# Patient Record
Sex: Male | Born: 1935
Health system: Southern US, Community
[De-identification: ages and names within clinical notes are randomized; demographics above are authoritative.]

## PROBLEM LIST (undated history)

## (undated) DIAGNOSIS — E785 Hyperlipidemia, unspecified: Secondary | ICD-10-CM

## (undated) DIAGNOSIS — I4891 Unspecified atrial fibrillation: Secondary | ICD-10-CM

## (undated) DIAGNOSIS — E079 Disorder of thyroid, unspecified: Secondary | ICD-10-CM

## (undated) DIAGNOSIS — K5790 Diverticulosis of intestine, part unspecified, without perforation or abscess without bleeding: Secondary | ICD-10-CM

## (undated) DIAGNOSIS — Z860100 Personal history of colon polyps, unspecified: Secondary | ICD-10-CM

## (undated) DIAGNOSIS — R768 Other specified abnormal immunological findings in serum: Secondary | ICD-10-CM

## (undated) DIAGNOSIS — C499 Malignant neoplasm of connective and soft tissue, unspecified: Secondary | ICD-10-CM

## (undated) DIAGNOSIS — E039 Hypothyroidism, unspecified: Secondary | ICD-10-CM

## (undated) DIAGNOSIS — N529 Male erectile dysfunction, unspecified: Secondary | ICD-10-CM

## (undated) DIAGNOSIS — C4492 Squamous cell carcinoma of skin, unspecified: Secondary | ICD-10-CM

## (undated) DIAGNOSIS — I10 Essential (primary) hypertension: Secondary | ICD-10-CM

## (undated) HISTORY — DX: Other specified abnormal immunological findings in serum: R76.8

## (undated) HISTORY — DX: Essential (primary) hypertension: I10

## (undated) HISTORY — DX: Diverticulosis of intestine, part unspecified, without perforation or abscess without bleeding: K57.90

## (undated) HISTORY — DX: Personal history of colon polyps, unspecified: Z86.0100

## (undated) HISTORY — DX: Personal history of colonic polyps: Z86.010

## (undated) HISTORY — DX: Male erectile dysfunction, unspecified: N52.9

## (undated) HISTORY — DX: Disorder of thyroid, unspecified: E07.9

## (undated) HISTORY — DX: Hyperlipidemia, unspecified: E78.5

---
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
## 2005-12-27 HISTORY — PX: COLONOSCOPY: SHX174

## 2006-06-23 ENCOUNTER — Ambulatory Visit: Payer: Self-pay | Admitting: Family Medicine

## 2006-06-30 ENCOUNTER — Ambulatory Visit: Payer: Self-pay | Admitting: Family Medicine

## 2006-07-26 ENCOUNTER — Ambulatory Visit: Payer: Self-pay | Admitting: Gastroenterology

## 2006-08-11 ENCOUNTER — Ambulatory Visit: Payer: Self-pay | Admitting: Gastroenterology

## 2006-08-11 ENCOUNTER — Encounter (INDEPENDENT_AMBULATORY_CARE_PROVIDER_SITE_OTHER): Payer: Self-pay | Admitting: *Deleted

## 2006-08-11 LAB — HM COLONOSCOPY

## 2007-08-01 ENCOUNTER — Ambulatory Visit: Payer: Self-pay | Admitting: Family Medicine

## 2007-11-06 ENCOUNTER — Ambulatory Visit: Payer: Self-pay | Admitting: Family Medicine

## 2008-03-27 ENCOUNTER — Ambulatory Visit: Payer: Self-pay | Admitting: Family Medicine

## 2008-08-22 ENCOUNTER — Ambulatory Visit: Payer: Self-pay | Admitting: Family Medicine

## 2008-09-23 ENCOUNTER — Ambulatory Visit: Payer: Self-pay | Admitting: Family Medicine

## 2009-06-24 ENCOUNTER — Ambulatory Visit: Payer: Self-pay | Admitting: Family Medicine

## 2010-09-08 ENCOUNTER — Ambulatory Visit: Payer: Self-pay | Admitting: Family Medicine

## 2011-09-14 ENCOUNTER — Ambulatory Visit (INDEPENDENT_AMBULATORY_CARE_PROVIDER_SITE_OTHER): Payer: Medicare Other | Admitting: Family Medicine

## 2011-09-14 VITALS — BP 116/70 | HR 70 | Wt 175.0 lb

## 2011-09-14 DIAGNOSIS — L989 Disorder of the skin and subcutaneous tissue, unspecified: Secondary | ICD-10-CM

## 2011-09-14 NOTE — Progress Notes (Signed)
  Subjective:    Patient ID: Jeffrey Hardin, male    DOB: 09-Feb-1934, 77 y.o.   MRN: [id number]  HPI He is here for evaluation of possible shingles. He developed a lesion on the right neck approximately 2 weeks ago. He has had no pain in that area tingling or burning.   Review of Systems     Objective:   Physical Exam Alert and in no distress. A. healing the 1 x 2 cm lesion is noted in the right mid neck area.       Assessment & Plan:  Benign neck lesion. Conservative care and return here in several weeks if the lesion continues .

## 2011-09-16 ENCOUNTER — Telehealth: Payer: Self-pay

## 2011-09-16 ENCOUNTER — Telehealth: Payer: Self-pay | Admitting: Family Medicine

## 2011-09-16 MED ORDER — AMLODIPINE BESY-BENAZEPRIL HCL 10-20 MG PO CAPS: 1.0000 | ORAL_CAPSULE | Freq: Every day | ORAL | Status: DC

## 2011-09-16 MED ORDER — LEVOTHYROXINE SODIUM 100 MCG PO CAPS: 1.0000 | ORAL_CAPSULE | ORAL | Status: DC

## 2011-09-16 NOTE — Telephone Encounter (Signed)
Called pt to inform him meds were sent in and pt is in need of a med check

## 2011-09-16 NOTE — Telephone Encounter (Signed)
Have him set up a med check appointment 

## 2011-09-16 NOTE — Telephone Encounter (Signed)
Meds renewed. He will need a followup med check appointment

## 2011-09-22 ENCOUNTER — Other Ambulatory Visit: Payer: Self-pay | Admitting: Family Medicine

## 2011-09-22 MED ORDER — LEVOTHYROXINE SODIUM 100 MCG PO TABS: 100.0000 ug | ORAL_TABLET | Freq: Every day | ORAL | Status: DC

## 2011-09-24 ENCOUNTER — Other Ambulatory Visit: Payer: Self-pay

## 2011-09-24 MED ORDER — LEVOTHYROXINE SODIUM 100 MCG PO TABS: 100.0000 ug | ORAL_TABLET | Freq: Every day | ORAL | Status: DC

## 2011-09-24 NOTE — Telephone Encounter (Signed)
RESENT L[facility]OT[facility]RO[facility]INE TO [facility]

## 2011-09-27 ENCOUNTER — Encounter: Payer: Self-pay | Admitting: Family Medicine

## 2011-09-28 ENCOUNTER — Ambulatory Visit (INDEPENDENT_AMBULATORY_CARE_PROVIDER_SITE_OTHER): Payer: Medicare Other | Admitting: Family Medicine

## 2011-09-28 ENCOUNTER — Encounter: Payer: Self-pay | Admitting: Family Medicine

## 2011-09-28 VITALS — BP 112/70 | HR 62 | Wt 176.0 lb

## 2011-09-28 DIAGNOSIS — E785 Hyperlipidemia, unspecified: Secondary | ICD-10-CM | POA: Insufficient documentation

## 2011-09-28 DIAGNOSIS — E039 Hypothyroidism, unspecified: Secondary | ICD-10-CM

## 2011-09-28 DIAGNOSIS — I119 Hypertensive heart disease without heart failure: Secondary | ICD-10-CM | POA: Insufficient documentation

## 2011-09-28 DIAGNOSIS — I10 Essential (primary) hypertension: Secondary | ICD-10-CM

## 2011-09-28 DIAGNOSIS — Z23 Encounter for immunization: Secondary | ICD-10-CM

## 2011-09-28 DIAGNOSIS — Z79899 Other long term (current) drug therapy: Secondary | ICD-10-CM

## 2011-09-28 LAB — COMPREHENSIVE METABOLIC PANEL
ALT: 46 U/L (ref 0–53)
AST: 38 U/L — ABNORMAL HIGH (ref 0–37)
Albumin: 4.1 g/dL (ref 3.5–5.2)
Alkaline Phosphatase: 45 U/L (ref 39–117)
BUN: 12 mg/dL (ref 6–23)
CO2: 26 mEq/L (ref 19–32)
Calcium: 9 mg/dL (ref 8.4–10.5)
Chloride: 102 mEq/L (ref 96–112)
Creat: 0.65 mg/dL (ref 0.50–1.35)
Glucose, Bld: 100 mg/dL — ABNORMAL HIGH (ref 70–99)
Potassium: 4.4 mEq/L (ref 3.5–5.3)
Sodium: 138 mEq/L (ref 135–145)
Total Bilirubin: 0.5 mg/dL (ref 0.3–1.2)
Total Protein: 6.7 g/dL (ref 6.0–8.3)

## 2011-09-28 LAB — CBC WITH DIFFERENTIAL/PLATELET
Basophils Absolute: 0 10*3/uL (ref 0.0–0.1)
Basophils Relative: 0 % (ref 0–1)
Eosinophils Absolute: 0.1 10*3/uL (ref 0.0–0.7)
Eosinophils Relative: 2 % (ref 0–5)
HCT: 47.1 % (ref 39.0–52.0)
Hemoglobin: 15.9 g/dL (ref 13.0–17.0)
Lymphocytes Relative: 22 % (ref 12–46)
Lymphs Abs: 1.1 10*3/uL (ref 0.7–4.0)
MCH: 32.8 pg (ref 26.0–34.0)
MCHC: 33.8 g/dL (ref 30.0–36.0)
MCV: 97.1 fL (ref 78.0–100.0)
Monocytes Absolute: 0.5 10*3/uL (ref 0.1–1.0)
Monocytes Relative: 11 % (ref 3–12)
Neutro Abs: 3.2 10*3/uL (ref 1.7–7.7)
Neutrophils Relative %: 66 % (ref 43–77)
Platelets: 303 10*3/uL (ref 150–400)
RBC: 4.85 MIL/uL (ref 4.22–5.81)
RDW: 13.6 % (ref 11.5–15.5)
WBC: 5 10*3/uL (ref 4.0–10.5)

## 2011-09-28 LAB — LIPID PANEL
HDL: 80 mg/dL (ref >39)
LDL Cholesterol: 108 mg/dL — ABNORMAL HIGH (ref 0–99)

## 2011-09-28 LAB — TSH: TSH: 2.464 u[IU]/mL (ref 0.350–4.500)

## 2011-09-28 NOTE — Progress Notes (Signed)
  Subjective:    Patient ID: Jeffrey Hardin, male    DOB: May 05, 1934, 77 y.o.   MRN: [id number]  HPI He is here for medication check. He continues on his thyroid medication and is having no questions or concerns about this. He also takes his Lotrel for blood pressure. Medical record you does show elevated lipid panel. He continues on a multivitamin and takes vitamin E. He has no other concerns or complaints.   Review of Systems     Objective:   Physical Exam Alert and in no distress otherwise not examined      Assessment & Plan:   1. Hypothyroid  TSH  2. Hypertension  CBC with Differential, Comprehensive metabolic panel, Lipid panel  3. Hyperlipidemia LDL goal < 100  Lipid panel  4. Encounter for long-term (current) use of other medications  CBC with Differential, Comprehensive metabolic panel, Lipid panel, TSH   encouraged him to continue take good care of himself.

## 2011-09-28 NOTE — Patient Instructions (Signed)
Continue on your present medications. 

## 2012-03-20 ENCOUNTER — Telehealth: Payer: Self-pay | Admitting: Internal Medicine

## 2012-03-20 MED ORDER — AMLODIPINE BESY-BENAZEPRIL HCL 10-20 MG PO CAPS: 1.0000 | ORAL_CAPSULE | Freq: Every day | ORAL | Status: DC

## 2012-03-20 NOTE — Telephone Encounter (Signed)
Lotrel renewed 

## 2012-03-29 ENCOUNTER — Other Ambulatory Visit: Payer: Self-pay | Admitting: Family Medicine

## 2012-03-29 NOTE — Telephone Encounter (Signed)
Is this ok?

## 2012-03-29 NOTE — Telephone Encounter (Signed)
Diprolene renewed

## 2012-05-29 ENCOUNTER — Other Ambulatory Visit: Payer: Self-pay

## 2012-05-29 ENCOUNTER — Encounter: Payer: Self-pay | Admitting: Family Medicine

## 2012-05-29 ENCOUNTER — Other Ambulatory Visit: Payer: Self-pay | Admitting: Family Medicine

## 2012-05-29 ENCOUNTER — Ambulatory Visit (INDEPENDENT_AMBULATORY_CARE_PROVIDER_SITE_OTHER): Payer: Medicare Other | Admitting: Family Medicine

## 2012-05-29 VITALS — BP 140/70 | HR 59 | Wt 174.0 lb

## 2012-05-29 DIAGNOSIS — J069 Acute upper respiratory infection, unspecified: Secondary | ICD-10-CM

## 2012-05-29 DIAGNOSIS — E039 Hypothyroidism, unspecified: Secondary | ICD-10-CM

## 2012-05-29 DIAGNOSIS — L989 Disorder of the skin and subcutaneous tissue, unspecified: Secondary | ICD-10-CM | POA: Diagnosis not present

## 2012-05-29 DIAGNOSIS — I10 Essential (primary) hypertension: Secondary | ICD-10-CM | POA: Diagnosis not present

## 2012-05-29 DIAGNOSIS — C4441 Basal cell carcinoma of skin of scalp and neck: Secondary | ICD-10-CM | POA: Diagnosis not present

## 2012-05-29 MED ORDER — AMLODIPINE BESY-BENAZEPRIL HCL 10-20 MG PO CAPS: 1.0000 | ORAL_CAPSULE | Freq: Every day | ORAL | Status: DC

## 2012-05-29 NOTE — Telephone Encounter (Signed)
[facility]

## 2012-05-29 NOTE — Progress Notes (Signed)
  Subjective:    Patient ID: Jeffrey Hardin, male    DOB: 03/14/1934, 78 y.o.   MRN: [id number]  HPI He is here for an interval evaluation. He does have a lesion present on the right neck area that over the last 6 months has caused some slight itching and apparently has gotten a little more red and has grown slightly. He also over the last 2 weeks his had difficulty with cough and congestion no fever chills sore throat or earache. This is getting better. He continues on his thyroid medicine and is checking on whether he needs a refill on that.   Review of Systems     Objective:   Physical Exam alert and in no distress. Tympanic membranes and canals are normal. Throat is clear. Tonsils are normal. Neck is supple without adenopathy or thyromegaly. Cardiac exam shows a regular sinus rhythm without murmurs or gallops. Lungs are clear to auscultation. The right side of the neck has an erythematous irregular slightly raised area approximately 2 cm in diameter TSH was checked in September and was normal.     Assessment & Plan:   1. Hypothyroid    2. Hypertension  amLODipine-benazepril (LOTREL) 10-20 MG per capsule  3. Changing skin lesion    4. Acute URI     he has enough refills on thyroid to get until September. Continue on his blood pressure medication and Lotrel was called in. The skin lesion was injected with  1cc Xylocaine and epinephrine. A 2 mm punch biopsy was done without difficulty. Supportive care for the URI since he is getting better on his own.

## 2012-06-13 DIAGNOSIS — C4441 Basal cell carcinoma of skin of scalp and neck: Secondary | ICD-10-CM | POA: Diagnosis not present

## 2012-08-01 DIAGNOSIS — Z85828 Personal history of other malignant neoplasm of skin: Secondary | ICD-10-CM | POA: Diagnosis not present

## 2012-08-30 ENCOUNTER — Encounter: Payer: Self-pay | Admitting: Internal Medicine

## 2012-09-07 ENCOUNTER — Encounter: Payer: Self-pay | Admitting: Family Medicine

## 2012-09-07 ENCOUNTER — Ambulatory Visit (INDEPENDENT_AMBULATORY_CARE_PROVIDER_SITE_OTHER): Payer: Medicare Other | Admitting: Family Medicine

## 2012-09-07 VITALS — BP 130/80 | HR 72 | Wt 175.0 lb

## 2012-09-07 DIAGNOSIS — E039 Hypothyroidism, unspecified: Secondary | ICD-10-CM

## 2012-09-07 DIAGNOSIS — Z79899 Other long term (current) drug therapy: Secondary | ICD-10-CM | POA: Diagnosis not present

## 2012-09-07 LAB — TSH: TSH: 1.641 u[IU]/mL (ref 0.350–4.500)

## 2012-09-07 NOTE — Progress Notes (Signed)
  Subjective:    Patient ID: Jeffrey Hardin, male    DOB: Aug 18, 1934, 78 y.o.   MRN: [id number]  HPI He is here for followup visit. Review of record indicates he has not had thyroid function testing done in approximately one year. He continues on his present thyroid medication and has no particular concerns or complaints. Had no skin changes, hair changes, mental or GI problems.  Review of Systems     Objective:   Physical Exam Alert and in no distress otherwise not examined       Assessment & Plan:   1. Hypothyroid  TSH  2. Encounter for long-term (current) use of other medications  TSH

## 2012-10-18 ENCOUNTER — Other Ambulatory Visit: Payer: Medicare Other

## 2012-10-18 DIAGNOSIS — Z23 Encounter for immunization: Secondary | ICD-10-CM

## 2012-10-18 MED ORDER — INFLUENZA VIRUS VACC SPLIT PF IM SUSP: 0.5000 mL | Freq: Once | INTRAMUSCULAR | Status: AC

## 2012-12-21 ENCOUNTER — Telehealth: Payer: Self-pay | Admitting: Family Medicine

## 2012-12-21 MED ORDER — LEVOTHYROXINE SODIUM 100 MCG PO TABS: 100.0000 ug | ORAL_TABLET | Freq: Every day | ORAL | Status: DC

## 2012-12-21 NOTE — Telephone Encounter (Signed)
Needs refill on levothyroxine 0.1  [facility]

## 2012-12-21 NOTE — Telephone Encounter (Signed)
SENT PT MED IN 

## 2013-03-19 ENCOUNTER — Other Ambulatory Visit: Payer: Self-pay | Admitting: Family Medicine

## 2013-05-02 DIAGNOSIS — H524 Presbyopia: Secondary | ICD-10-CM | POA: Diagnosis not present

## 2013-05-02 DIAGNOSIS — H35039 Hypertensive retinopathy, unspecified eye: Secondary | ICD-10-CM | POA: Diagnosis not present

## 2013-05-02 DIAGNOSIS — H2510 Age-related nuclear cataract, unspecified eye: Secondary | ICD-10-CM | POA: Diagnosis not present

## 2013-08-25 ENCOUNTER — Other Ambulatory Visit: Payer: Self-pay | Admitting: Family Medicine

## 2013-09-10 ENCOUNTER — Telehealth: Payer: Self-pay | Admitting: Family Medicine

## 2013-09-10 NOTE — Telephone Encounter (Signed)
appt made

## 2013-09-11 ENCOUNTER — Ambulatory Visit (INDEPENDENT_AMBULATORY_CARE_PROVIDER_SITE_OTHER): Payer: Medicare Other | Admitting: Family Medicine

## 2013-09-11 ENCOUNTER — Encounter: Payer: Self-pay | Admitting: Family Medicine

## 2013-09-11 VITALS — BP 138/80 | HR 60 | Wt 169.0 lb

## 2013-09-11 DIAGNOSIS — E785 Hyperlipidemia, unspecified: Secondary | ICD-10-CM

## 2013-09-11 DIAGNOSIS — I10 Essential (primary) hypertension: Secondary | ICD-10-CM | POA: Diagnosis not present

## 2013-09-11 DIAGNOSIS — Z79899 Other long term (current) drug therapy: Secondary | ICD-10-CM

## 2013-09-11 DIAGNOSIS — E039 Hypothyroidism, unspecified: Secondary | ICD-10-CM

## 2013-09-11 LAB — COMPREHENSIVE METABOLIC PANEL
Albumin: 4.2 g/dL (ref 3.5–5.2)
Alkaline Phosphatase: 50 U/L (ref 39–117)
BUN: 11 mg/dL (ref 6–23)
CO2: 25 mEq/L (ref 19–32)
Glucose, Bld: 106 mg/dL — ABNORMAL HIGH (ref 70–99)
Potassium: 4.3 mEq/L (ref 3.5–5.3)
Total Bilirubin: 0.6 mg/dL (ref 0.3–1.2)

## 2013-09-11 LAB — LIPID PANEL
Cholesterol: 226 mg/dL — ABNORMAL HIGH (ref 0–200)
HDL: 75 mg/dL (ref >39)
LDL Cholesterol: 138 mg/dL — ABNORMAL HIGH (ref 0–99)
Triglycerides: 66 mg/dL (ref <150)

## 2013-09-11 LAB — CBC WITH DIFFERENTIAL/PLATELET
Basophils Relative: 1 % (ref 0–1)
Eosinophils Absolute: 0.1 10*3/uL (ref 0.0–0.7)
HCT: 47.4 % (ref 39.0–52.0)
Hemoglobin: 16.4 g/dL (ref 13.0–17.0)
MCH: 33.3 pg (ref 26.0–34.0)
MCHC: 34.6 g/dL (ref 30.0–36.0)
MCV: 96.3 fL (ref 78.0–100.0)
Monocytes Absolute: 0.6 10*3/uL (ref 0.1–1.0)
Monocytes Relative: 12 % (ref 3–12)

## 2013-09-11 MED ORDER — LEVOTHYROXINE SODIUM 100 MCG PO TABS: 100.0000 ug | ORAL_TABLET | Freq: Every day | ORAL | Status: DC

## 2013-09-11 MED ORDER — AMLODIPINE BESY-BENAZEPRIL HCL 10-20 MG PO CAPS: ORAL_CAPSULE | ORAL | Status: DC

## 2013-09-11 NOTE — Patient Instructions (Signed)
Keep taking good care of yourself 

## 2013-09-11 NOTE — Progress Notes (Signed)
  Subjective:    Patient ID: CARLIE CORPUS, male    DOB: 01/05/1934, 79 y.o.   MRN: [id number]  HPI He is here for a medication check. He continues to have excellent health. He has no major concerns. Continues on his thyroid medicine as well as his blood pressure medication. He does have an underlying history of hyperlipidemia. He is retired and he and his wife are enjoying their retirement. His medical record was reviewed including visitations and health maintenance.   Review of Systems     Objective:   Physical Exam alert and in no distress. Tympanic membranes and canals are normal. Throat is clear. Tonsils are normal. Neck is supple without adenopathy or thyromegaly. Cardiac exam shows a regular sinus rhythm without murmurs or gallops. Lungs are clear to auscultation.        Assessment & Plan:  Hypothyroid - Plan: TSH, levothyroxine (SYNTHROID) 100 MCG tablet  Hypertension - Plan: CBC with Differential, Comprehensive metabolic panel, amLODipine-benazepril (LOTREL) 10-20 MG per capsule  Hyperlipidemia LDL goal < 100 - Plan: Lipid panel  Encounter for long-term (current) use of other medications - Plan: Flu Vaccine QUAD 36+ mos IM, CBC with Differential, Comprehensive metabolic panel, Lipid panel, TSH  regular flu shot given. The more potent dosing is on back order.

## 2013-12-13 ENCOUNTER — Ambulatory Visit (INDEPENDENT_AMBULATORY_CARE_PROVIDER_SITE_OTHER): Payer: Medicare Other | Admitting: Family Medicine

## 2013-12-13 ENCOUNTER — Encounter: Payer: Self-pay | Admitting: Family Medicine

## 2013-12-13 VITALS — BP 120/70 | HR 60 | Temp 98.0°F | Ht 68.0 in | Wt 162.0 lb

## 2013-12-13 DIAGNOSIS — I4891 Unspecified atrial fibrillation: Secondary | ICD-10-CM | POA: Diagnosis not present

## 2013-12-13 DIAGNOSIS — I10 Essential (primary) hypertension: Secondary | ICD-10-CM | POA: Diagnosis not present

## 2013-12-13 DIAGNOSIS — R079 Chest pain, unspecified: Secondary | ICD-10-CM

## 2013-12-13 DIAGNOSIS — J209 Acute bronchitis, unspecified: Secondary | ICD-10-CM

## 2013-12-13 MED ORDER — AMOXICILLIN 875 MG PO TABS: 875.0000 mg | ORAL_TABLET | Freq: Two times a day (BID) | ORAL | Status: DC

## 2013-12-13 NOTE — Progress Notes (Signed)
   Subjective:    Patient ID: Jeffrey Hardin, male    DOB: Jun 30, 1934, 79 y.o.   MRN: [id number]  HPI He has a two week history of cough but no fever, chills, sore throat or earache. Over the last weekend the coughing got worse and productive as well as feeling of malaise and fatigue. He also describes episodes of swallowing cold liquids and having a chest tightness sensation in the mid chest that does go away relatively quickly. He then had one episode of chest tightness while riding on his lawnmower lasted approximately half an hour associated with weakness. This was several days ago. He has had no PND, DOE or orthopnea. He has had no past difficulty with heart problems. He does not smoke.  Review of Systems     Objective:   Physical Exam alert and in no distress. Tympanic membranes and canals are normal. Throat is clear. Tonsils are normal. Neck is supple without adenopathy or thyromegaly. Cardiac exam shows a regular sinus rhythm without murmurs or gallops. Lungs are clear to auscultation. EKG shows a bradycardia and evidence of atrial fibrillation as well as possible old MI.       Assessment & Plan:  Acute bronchitis - Plan: amoxicillin (AMOXIL) 875 MG tablet  Chest pain - Plan: EKG 12-Lead, Ambulatory referral to Cardiology  Hypertension  Atrial fibrillation, new onset  some of his chest tightness systems can also be GI related however the one episode while he was on his lawnmower has me concerned.

## 2013-12-14 ENCOUNTER — Encounter: Payer: Self-pay | Admitting: Cardiology

## 2013-12-14 ENCOUNTER — Ambulatory Visit
Admission: RE | Admit: 2013-12-14 | Discharge: 2013-12-14 | Disposition: A | Discharge status: Home / Self Care | Payer: Medicare Other | Source: Ambulatory Visit | Attending: Cardiology | Admitting: Cardiology

## 2013-12-14 ENCOUNTER — Other Ambulatory Visit: Payer: Self-pay | Admitting: Cardiology

## 2013-12-14 DIAGNOSIS — I10 Essential (primary) hypertension: Secondary | ICD-10-CM | POA: Diagnosis not present

## 2013-12-14 DIAGNOSIS — I4891 Unspecified atrial fibrillation: Secondary | ICD-10-CM

## 2013-12-14 DIAGNOSIS — R0789 Other chest pain: Secondary | ICD-10-CM | POA: Diagnosis not present

## 2013-12-14 DIAGNOSIS — Z8679 Personal history of other diseases of the circulatory system: Secondary | ICD-10-CM | POA: Insufficient documentation

## 2013-12-14 DIAGNOSIS — Z7901 Long term (current) use of anticoagulants: Secondary | ICD-10-CM | POA: Insufficient documentation

## 2013-12-14 DIAGNOSIS — E039 Hypothyroidism, unspecified: Secondary | ICD-10-CM | POA: Diagnosis not present

## 2013-12-14 NOTE — Progress Notes (Signed)
Patient ID: Jeffrey Hardin, male   DOB: 09/06/1934, 79 y.o.   MRN: [id number]  Jeffrey, Hardin    Date of visit:  12/14/2013 DOB:  06-28-1934    Age:  79 yrs. Medical record number:  [id number]     Account number:  [id number] Primary Care Provider: Jill Alexanders C ____________________________ CURRENT DIAGNOSES  1. Arrhythmia-Atrial Fibrillation  2. Chest Pain  3. Hypertension,Essential (Benign)  4. Hypothyroidism  5. Long Term Use Anticoagulant ____________________________ ALLERGIES  Sildenafil, Intolerance-unknown ____________________________ MEDICATIONS  1. amlodipine 10 mg-benazepril 20 mg capsule, 1 p.o. daily  2. Diprolene 0.05 % lotion, Take as directed  3. Levoxyl 100 mcg tablet, 1 p.o. daily  4. multivitamin tablet, 1 p.o. daily  5. vitamin E 1,000 unit capsule, 1 p.o. daily  6. aspirin 325 mg tablet, PRN  7. Eliquis 5 mg tablet, BID ____________________________ CHIEF COMPLAINTS  Chest tightness with yard work monday  Saw dr Redmond School thurs ____________________________ HISTORY OF PRESENT ILLNESS Patient seen at the request of Dr. Redmond School for evaluation of atrial fibrillation and chest discomfort. The patient has previously been in good health except for history of hypertension and he has a previous left leg amputation as in the previous sarcoma over 50 years ago. He normally gets along and feels well. Earlier this week he was mowing his grass and had some midsternal chest discomfort and did not feel well. He continued to cut the grass and felt some fatigue and later on in the week went to see Dr. Redmond School where he was noted to be in atrial fibrillation and there is a question of septal Q waves on EKG. He currently feels fine at the present time. He was unaware that his heart was irregular and he denied angina or shortness of breath. He has had no recurrence of chest pain since then. He did have a prior history of a cough for a couple of weeks prior to coming in. He otherwise has felt well.  He has had no PND, orthopnea or claudication. He does have some mild edema at the end of the day. ____________________________ PAST HISTORY  Past Medical Illnesses:  hypertension, hypothyroidism, history of synovial sarcoma 1957;  Cardiovascular Illnesses:  atrial fibrillation;  Surgical Procedures:  amputation l leg, tonsillectomy;  Cardiology Procedures-Invasive:  no history of prior cardiac procedures;  Cardiology Procedures-Noninvasive:  no previous non-invasive procedures;  LVEF not documented,   ____________________________ CARDIO-PULMONARY TEST DATES EKG Date:  12/14/2013;   ____________________________ FAMILY HISTORY Brother -- Transplantation of liver, Brother alive with problem Father -- Father dead, Pulmonary emphysema Mother -- Mother dead, Death of unknown cause Sister -- Sister alive and well Sister -- Sister alive and well Sister -- Sister dead, Leukemia Sister -- Sister alive and well ____________________________ SOCIAL HISTORY Alcohol Use:  beer 1 6-pack per day;  Smoking:  never smoked;  Diet:  regular diet;  Lifestyle:  married;  Exercise:  yard work;  Occupation:  Chief Financial Officer;  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  Integumentary:no rashes or new skin lesions. Eyes: wears eye glasses/contact lenses Ears, Nose, Throat, Mouth:  denies any hearing loss, epistaxis, hoarseness or difficulty speaking. Respiratory: denies dyspnea, cough, wheezing or hemoptysis. Cardiovascular:  please review HPI Abdominal: denies dyspepsia, GI bleeding, constipation, or diarrhea Genitourinary-Male: erectile dysfunction  Musculoskeletal:  denies arthritis, venous insufficiency, or muscle weakness. Neurological:  denies headaches, stroke, or TIA Psychiatric:  denies depession or anxiety.  Hematological/Immunologic:  denies any food allergies, bleeding  disorders. ____________________________ PHYSICAL EXAMINATION VITAL SIGNS  Blood Pressure:  140/70 Sitting, Right arm, regular cuff  , 140/72 Standing, Right arm and regular cuff   Pulse:  78/min. Weight:  168.00 lbs. Height:  68"BMI: 25  Constitutional:  pleasant white male in no acute distress Skin:  warm and dry to touch, no apparent skin lesions, or masses noted. Head:  normocephalic, balding male hair pattern Eyes:  EOMS Intact, PERRLA, C and S clear, Funduscopic exam not done. ENT:  ears, nose and throat reveal no gross abnormalities.  Dentition good. Neck:  supple, without massess. No JVD, thyromegaly or carotid bruits. Carotid upstroke normal. Chest:  normal symmetry, clear to auscultation and percussion. Cardiac:  irregular rhythm, normal S1 and S2, no S3 or S4, no murmur Abdomen:  abdomen soft,non-tender, no masses, no hepatospenomegaly, or aneurysm noted Peripheral Pulses:  the femoral,dorsalis pedis, and posterior tibial pulses are full and equal bilaterally with no bruits auscultated. Extremities & Back:  left AKA, 2+ edema, RLE venous insufficiency changes present Neurological:  no gross motor or sensory deficits noted, affect appropriate, oriented x3. ____________________________ MOST RECENT LIPID PANEL 09/11/13  CHOL TOTL 226 mg/dl, LDL 138 NM, HDL 75 mg/dl, TRIGLYCER 66 mg/dl and CHOL/HDL 3.0 (Calc) ____________________________ IMPRESSIONS/PLAN  1. Atrial fibrillation of undetermined age of onset with relatively well-controlled rate 2. Isolated episode of chest discomfort of uncertain cause 3. Hypertension controlled 4. Hyperlipidemia  Recommendations:  Long discussion about atrial fibrillation. His CHADS2VASC score is 3. We discussed atrial fibrillation and prevention of stroke risk with anticoagulation. We discussed various anticoagulations including warfarin as well as the new normal oral anticoagulant. He is getting ready to take a trip in a couple of weeks. His  EKG shows atrial fibrillation and I did not see definite septal Q waves. I would recommend that he have a lab work is noted below as well as an x-ray and that he have an echocardiogram to evaluate his left ventricular function. At the present time he is not that symptomatic. I initiated anticoagulation with Eliquus 5 mg twice daily and will see him in followup when he has his echocardiogram. ____________________________ TODAYS ORDERS  1. 2D, color flow, doppler: First Available  2. CHEST XRAY: Today  3. Comprehensive Metabolic Panel: Today  4. Complete Blood Count: Today  5. Troponin-I  6. Chest X-ray PA/Lat: today  7. 12 Lead EKG: Today                       ____________________________ Cardiology Physician:  Kerry Hough MD [facility]

## 2013-12-19 ENCOUNTER — Encounter: Payer: Self-pay | Admitting: Cardiology

## 2013-12-19 DIAGNOSIS — Z7901 Long term (current) use of anticoagulants: Secondary | ICD-10-CM | POA: Diagnosis not present

## 2013-12-19 DIAGNOSIS — R0789 Other chest pain: Secondary | ICD-10-CM | POA: Diagnosis not present

## 2013-12-19 DIAGNOSIS — I4891 Unspecified atrial fibrillation: Secondary | ICD-10-CM | POA: Diagnosis not present

## 2013-12-19 DIAGNOSIS — I10 Essential (primary) hypertension: Secondary | ICD-10-CM | POA: Diagnosis not present

## 2013-12-19 DIAGNOSIS — E039 Hypothyroidism, unspecified: Secondary | ICD-10-CM | POA: Diagnosis not present

## 2013-12-19 NOTE — Progress Notes (Signed)
Patient ID: Jeffrey Hardin, male   DOB: March 08, 1934, 79 y.o.   MRN: [id number]   [place], [place]    Date of visit:  12/19/2013 DOB:  1934/07/22    Age:  79 yrs. Medical record number:  [id number]     Account number:  [id number] Primary Care Provider: Jill Alexanders C ____________________________ CURRENT DIAGNOSES  1. Arrhythmia-Atrial Fibrillation  2. Hypertensive Heart Disease-Benign without CHF  3. Hypothyroidism  4. Long Term Use Anticoagulant ____________________________ ALLERGIES  Sildenafil, Intolerance-unknown ____________________________ MEDICATIONS  1. amlodipine 10 mg-benazepril 20 mg capsule, 1 p.o. daily  2. Diprolene 0.05 % lotion, Take as directed  3. Levoxyl 100 mcg tablet, 1 p.o. daily  4. multivitamin tablet, 1 p.o. daily  5. vitamin E 1,000 unit capsule, 1 p.o. daily  6. Eliquis 5 mg tablet, BID  7. Osteo Bi-Flex 250 mg-200 mg tablet, 1 p.o. daily  8. furosemide 20 mg tablet, 1 p.o. daily ____________________________ CHIEF COMPLAINTS  Followup of Arrhythmia-Atrial Fibrillation ____________________________ HISTORY OF PRESENT ILLNESS  Patient seen for cardiac followup. He is feeling well at the present time and denies recurrent chest pain. He has no shortness of breath although he does have some mild edema still. He feels as if he is back to his normal self. He is tolerated Eliquus well without complications. An echocardiogram today shows preserved LV systolic function with LVH, mild tricuspid regurgitation with mild elevations of pulmonary artery pressures and a dilated IVC with reduced respiratory variation. There was no evidence of a previous infarction. His ventricular rate is still controlled. ____________________________ PAST HISTORY  Past Medical Illnesses:  hypertension, hypothyroidism, history of synovial sarcoma 1957;  Cardiovascular Illnesses:  atrial fibrillation;  Surgical Procedures:  amputation l leg, tonsillectomy;  Cardiology Procedures-Invasive:  no history of  prior cardiac procedures;  Cardiology Procedures-Noninvasive:  echocardiogram December 2014;  LVEF of 55% documented via echocardiogram on 12/19/2013,   ____________________________ CARDIO-PULMONARY TEST DATES EKG Date:  12/14/2013;  Echocardiography Date: 12/19/2013;  Chest Xray Date: 12/14/2013;   ____________________________ FAMILY HISTORY Brother -- Transplantation of liver, Brother alive with problem Father -- Father dead, Pulmonary emphysema Mother -- Mother dead, Death of unknown cause Sister -- Sister alive and well Sister -- Sister alive and well Sister -- Sister dead, Leukemia Sister -- Sister alive and well ____________________________ SOCIAL HISTORY Alcohol Use:  beer 1 6-pack per day;  Smoking:  never smoked;  Diet:  regular diet;  Lifestyle:  married;  Exercise:  yard work;  Occupation:  Chief Financial Officer;  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 Eyes: wears eye glasses/contact lenses Respiratory: denies dyspnea, cough, wheezing or hemoptysis. Cardiovascular:  please review HPI Abdominal: denies dyspepsia, GI bleeding, constipation, or diarrhea Genitourinary-Male: erectile dysfunction  Musculoskeletal:  denies arthritis, venous insufficiency, or muscle weakness. Neurological:  denies headaches, stroke, or TIA  ____________________________ PHYSICAL EXAMINATION VITAL SIGNS  Blood Pressure:  140/70 Sitting, Left arm, regular cuff  , 136/72 Standing, Left arm and regular cuff   Pulse:  90/min. Weight:  171.00 lbs. Height:  68"BMI: 26  Constitutional:  pleasant white male in no acute distress Skin:  warm and dry to touch, no apparent skin lesions, or masses noted. Head:  normocephalic, balding male hair pattern Chest:  normal symmetry, clear to auscultation Cardiac:  irregular rhythm, normal S1 and S2, no S3 or S4, no murmur Abdomen:  abdomen soft,non-tender, no masses,  no hepatospenomegaly, or aneurysm noted Peripheral Pulses:  the femoral,dorsalis pedis, and posterior tibial pulses are full and equal bilaterally with no bruits auscultated. Extremities & Back:  left AKA, 2+ edema, RLE venous insufficiency changes present ____________________________ MOST RECENT LIPID PANEL 09/11/13  CHOL TOTL 226 mg/dl, LDL 138 NM, HDL 75 mg/dl, TRIGLYCER 66 mg/dl and CHOL/HDL 3.0 (Calc) ____________________________ IMPRESSIONS/PLAN  1. Atrial fibrillation of unknown duration of onset 2. Long-term anticoagulation with Eliquus 3. Mild concentric LVH with normal systolic function and no evidence of a previous infarction 4. Chronic venous insufficiency of the right leg 5. Mild hyperlipidemia  Recommendations:  Clinically the patient is doing well. At the present time he appears to have atrial fibrillation and has had no recurrence of chest pain. He is going to be out of town for the next 2 weeks. I recommended that he start a small dose of furosemide 20 mg daily because of evidence of some increased right atrial pressure and mild pulmonary hypertension. This may be due to atrial fibrillation. One would wonder whether he went out of rhythm earlier in the week when he was on the lawnmower. He appears to be tolerating it well and I recommended that he continue Eliquus and start the furosemide. He would benefit from a lipid-lowering agent because of his risk factors and age. We may do a stress test on him when he returns to town with [place]. His previous troponin was negative when I checked it earlier. ____________________________ TODAYS ORDERS  1. Return Visit: 1 month                       ____________________________ Cardiology Physician:  Kerry Hough MD [facility]

## 2013-12-19 NOTE — Progress Notes (Signed)
Patient ID: Jeffrey Hardin, male   DOB: [birth year], 79 y.o.   MRN: [id number]   Jeffrey, Hardin    Date of visit:  12/14/2013 DOB:  November 16, 1934    Age:  79 yrs. Medical record number:  [id number]     Account number:  [id number] Primary Care Provider: Jill Alexanders C ____________________________ CURRENT DIAGNOSES  1. Arrhythmia-Atrial Fibrillation  2. Chest Pain  3. Hypertension,Essential (Benign)  4. Hypothyroidism  5. Long Term Use Anticoagulant ____________________________ ALLERGIES  Sildenafil, Intolerance-unknown ____________________________ MEDICATIONS  1. amlodipine 10 mg-benazepril 20 mg capsule, 1 p.o. daily  2. Diprolene 0.05 % lotion, Take as directed  3. Levoxyl 100 mcg tablet, 1 p.o. daily  4. multivitamin tablet, 1 p.o. daily  5. vitamin E 1,000 unit capsule, 1 p.o. daily  6. aspirin 325 mg tablet, PRN  7. Eliquis 5 mg tablet, BID ____________________________ CHIEF COMPLAINTS  Chest tightness with yard work monday  Saw dr Redmond School thurs ____________________________ HISTORY OF PRESENT ILLNESS Patient seen at the request of Dr. Redmond School for evaluation of atrial fibrillation and chest discomfort. The patient has previously been in good health except for history of hypertension and he has a previous left leg amputation as in the previous sarcoma over 50 years ago. He normally gets along and feels well. Earlier this week he was mowing his grass and had some midsternal chest discomfort and did not feel well. He continued to cut the grass and felt some fatigue and later on in the week went to see Dr. Redmond School where he was noted to be in atrial fibrillation and there is a question of septal Q waves on EKG. He currently feels fine at the present time. He was unaware that his heart was irregular and he denied angina or shortness of breath. He has had no recurrence of chest pain since then. He did have a prior history of a cough for a couple of weeks prior to coming in. He otherwise has felt well.  He has had no PND, orthopnea or claudication. He does have some mild edema at the end of the day. ____________________________ PAST HISTORY  Past Medical Illnesses:  hypertension, hypothyroidism, history of synovial sarcoma 1957;  Cardiovascular Illnesses:  atrial fibrillation;  Surgical Procedures:  amputation l leg, tonsillectomy;  Cardiology Procedures-Invasive:  no history of prior cardiac procedures;  Cardiology Procedures-Noninvasive:  no previous non-invasive procedures;  LVEF not documented,   ____________________________ CARDIO-PULMONARY TEST DATES EKG Date:  12/14/2013;   ____________________________ FAMILY HISTORY Brother -- Transplantation of liver, Brother alive with problem Father -- Father dead, Pulmonary emphysema Mother -- Mother dead, Death of unknown cause Sister -- Sister alive and well Sister -- Sister alive and well Sister -- Sister dead, Leukemia Sister -- Sister alive and well ____________________________ SOCIAL HISTORY Alcohol Use:  beer 1 6-pack per day;  Smoking:  never smoked;  Diet:  regular diet;  Lifestyle:  married;  Exercise:  yard work;  Occupation:  Chief Financial Officer;  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  Integumentary:no rashes or new skin lesions. Eyes: wears eye glasses/contact lenses Ears, Nose, Throat, Mouth:  denies any hearing loss, epistaxis, hoarseness or difficulty speaking. Respiratory: denies dyspnea, cough, wheezing or hemoptysis. Cardiovascular:  please review HPI Abdominal: denies dyspepsia, GI bleeding, constipation, or diarrhea Genitourinary-Male: erectile dysfunction  Musculoskeletal:  denies arthritis, venous insufficiency, or muscle weakness. Neurological:  denies headaches, stroke, or TIA Psychiatric:  denies depession or  anxiety. Hematological/Immunologic:  denies any food allergies, bleeding  disorders. ____________________________ PHYSICAL EXAMINATION VITAL SIGNS  Blood Pressure:  140/70 Sitting, Right arm, regular cuff  , 140/72 Standing, Right arm and regular cuff   Pulse:  78/min. Weight:  168.00 lbs. Height:  68"BMI: 25  Constitutional:  pleasant white male in no acute distress Skin:  warm and dry to touch, no apparent skin lesions, or masses noted. Head:  normocephalic, balding male hair pattern Eyes:  EOMS Intact, PERRLA, C and S clear, Funduscopic exam not done. ENT:  ears, nose and throat reveal no gross abnormalities.  Dentition good. Neck:  supple, without massess. No JVD, thyromegaly or carotid bruits. Carotid upstroke normal. Chest:  normal symmetry, clear to auscultation and percussion. Cardiac:  irregular rhythm, normal S1 and S2, no S3 or S4, no murmur Abdomen:  abdomen soft,non-tender, no masses, no hepatospenomegaly, or aneurysm noted Peripheral Pulses:  the femoral,dorsalis pedis, and posterior tibial pulses are full and equal bilaterally with no bruits auscultated. Extremities & Back:  left AKA, 2+ edema, RLE venous insufficiency changes present Neurological:  no gross motor or sensory deficits noted, affect appropriate, oriented x3. ____________________________ MOST RECENT LIPID PANEL 09/11/13  CHOL TOTL 226 mg/dl, LDL 138 NM, HDL 75 mg/dl, TRIGLYCER 66 mg/dl and CHOL/HDL 3.0 (Calc) ____________________________ IMPRESSIONS/PLAN  1. Atrial fibrillation of undetermined age of onset with relatively well-controlled rate 2. Isolated episode of chest discomfort of uncertain cause 3. Hypertension controlled 4. Hyperlipidemia  Recommendations:  Long discussion about atrial fibrillation. His CHADS2VASC score is 3. We discussed atrial fibrillation and prevention of stroke risk with anticoagulation. We discussed various anticoagulations including warfarin as well as the new normal oral anticoagulant. He is getting ready to take a trip in a couple of weeks. His  EKG shows atrial fibrillation and I did not see definite septal Q waves. I would recommend that he have  lab work as noted below as well as an x-ray and that he have an echocardiogram to evaluate his left ventricular function. At the present time he is not that symptomatic. I initiated anticoagulation with Eliquus 5 mg twice daily and will see him in followup when he has his echocardiogram. ____________________________ TODAYS ORDERS  1. 2D, color flow, doppler: First Available  2. CHEST XRAY: Today  3. Comprehensive Metabolic Panel: Today  4. Complete Blood Count: Today  5. Troponin-I  6. Chest X-ray PA/Lat: today  7. 12 Lead EKG: Today                       ____________________________ Cardiology Physician:  Kerry Hough MD [facility]

## 2014-01-03 ENCOUNTER — Encounter: Payer: Self-pay | Admitting: Family Medicine

## 2014-01-22 ENCOUNTER — Encounter: Payer: Self-pay | Admitting: Cardiology

## 2014-01-22 DIAGNOSIS — I4891 Unspecified atrial fibrillation: Secondary | ICD-10-CM | POA: Diagnosis not present

## 2014-01-22 DIAGNOSIS — E039 Hypothyroidism, unspecified: Secondary | ICD-10-CM | POA: Diagnosis not present

## 2014-01-22 DIAGNOSIS — I119 Hypertensive heart disease without heart failure: Secondary | ICD-10-CM | POA: Diagnosis not present

## 2014-01-22 DIAGNOSIS — Z7901 Long term (current) use of anticoagulants: Secondary | ICD-10-CM | POA: Diagnosis not present

## 2014-01-22 NOTE — Progress Notes (Unsigned)
Patient ID: Jeffrey Hardin, male   DOB: [birth year], 80 y.o.   MRN: [id number]   Hardin, Jeffrey    Date of visit:  01/22/2014 DOB:  August 23, 1934    Age:  80 yrs. Medical record number:  [id number]     Account number:  [id number] Primary Care Provider: Jill Hardin C ____________________________ CURRENT DIAGNOSES  1. Arrhythmia-Atrial Fibrillation  2. Hypertensive Heart Disease-Benign without CHF  3. Hypothyroidism  4. Long Term Use Anticoagulant ____________________________ ALLERGIES  Sildenafil, Intolerance-unknown ____________________________ MEDICATIONS  1. amlodipine 10 mg-benazepril 20 mg capsule, 1 p.o. daily  2. Diprolene 0.05 % lotion, Take as directed  3. Levoxyl 100 mcg tablet, 1 p.o. daily  4. multivitamin tablet, 1 p.o. daily  5. vitamin E 1,000 unit capsule, 1 p.o. daily  6. Osteo Bi-Flex 250 mg-200 mg tablet, 1 p.o. daily  7. Eliquis 5 mg tablet, BID ____________________________ CHIEF COMPLAINTS  Followup of Arrhythmia-Atrial Fibrillation  Followup of Long Term Use Anticoagulant ____________________________ HISTORY OF PRESENT ILLNESS the Patient seen for cardiac followup. He has been doing well since he was previously here. He was able to go on a cruise and tolerated this well. He has had no recurrence of chest tightness or dyspnea since he was here. He took furosemide briefly but stopped taking it because he stated that it dried his eyes and his throat now. He did lose a little bit of weight with that. He feels fine and feels as if he is back to his normal baseline. He is tolerating the Eliquus well without complications ____________________________ PAST HISTORY  Past Medical Illnesses:  hypertension, hypothyroidism, history of synovial sarcoma 1957;  Cardiovascular Illnesses:  atrial fibrillation;  Surgical Procedures:  amputation l leg, tonsillectomy;  Cardiology Procedures-Invasive:  no history of prior cardiac procedures;  Cardiology Procedures-Noninvasive:  echocardiogram  December 2014;  LVEF of 55% documented via echocardiogram on 12/19/2013,   ____________________________ CARDIO-PULMONARY TEST DATES EKG Date:  12/14/2013;  Echocardiography Date: 12/19/2013;  Chest Xray Date: 12/14/2013;   ____________________________ FAMILY HISTORY Brother -- Transplantation of liver, Brother alive with problem Father -- Father dead, Pulmonary emphysema Mother -- Mother dead, Death of unknown cause Sister -- Sister alive and well Sister -- Sister alive and well Sister -- Sister dead, Leukemia Sister -- Sister alive and well ____________________________ SOCIAL HISTORY Alcohol Use:  beer 1 6-pack per day;  Smoking:  never smoked;  Diet:  regular diet;  Lifestyle:  married;  Exercise:  yard work;  Occupation:  Chief Financial Officer;  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 Eyes: wears eye glasses/contact lenses Respiratory: denies dyspnea, cough, wheezing or hemoptysis. Cardiovascular:  please review HPI Abdominal: denies dyspepsia, GI bleeding, constipation, or diarrhea Genitourinary-Male: erectile dysfunction  Musculoskeletal:  denies arthritis, venous insufficiency, or muscle weakness. Neurological:  denies headaches, stroke, or TIA  ____________________________ PHYSICAL EXAMINATION VITAL SIGNS  Blood Pressure:  140/72 Sitting, Right arm, regular cuff  , 144/76 Standing, Right arm and regular cuff   Pulse:  68/min. Weight:  170.00 lbs. Height:  68"BMI: 26  Constitutional:  pleasant white male in no acute distress Skin:  warm and dry to touch, no apparent skin lesions, or masses noted. Head:  normocephalic, balding male hair pattern Chest:  normal symmetry, clear to auscultation. Cardiac:  irregular rhythm, normal S1 and S2, no S3 or S4, no murmur Abdomen:  abdomen soft,non-tender, no masses, no hepatospenomegaly, or aneurysm noted Peripheral Pulses:  the  femoral,dorsalis pedis, and posterior tibial pulses are full and equal bilaterally with no bruits auscultated. Extremities & Back:  left AKA, 2+ edema, RLE venous insufficiency changes present Neurological:  no gross motor or sensory deficits noted, affect appropriate, oriented x3. ____________________________ MOST RECENT LIPID PANEL 09/11/13  CHOL TOTL 226 mg/dl, LDL 138 NM, HDL 75 mg/dl, TRIGLYCER 66 mg/dl and CHOL/HDL 3.0 (Calc) ____________________________ IMPRESSIONS/PLAN  1. Persistent atrial fibrillation that is not symptomatic 2. Prior episode of chest tightness that has resolved with no recurrence 3. Chronic venous insufficiency  Recommendations:  Clinically he is getting along reasonably well at this time. We talked about doing a myocardial perfusion scan but he refused this and stated that he felt fine and did not wish to have further cardiac testing since he is feeling well. His atrial fibrillation rate is well controlled and he will continue Eliquus. Followup in 6 months. ____________________________ TODAYS ORDERS  1. Return Visit: 6 months  2. 12 Lead EKG: 6 months                       ____________________________ Cardiology Physician:  Kerry Hough MD [facility]

## 2014-02-09 ENCOUNTER — Other Ambulatory Visit: Payer: Self-pay | Admitting: Family Medicine

## 2014-02-11 NOTE — Telephone Encounter (Signed)
Is this okay to refill? 

## 2014-06-18 ENCOUNTER — Telehealth: Payer: Self-pay | Admitting: Family Medicine

## 2014-06-18 NOTE — Telephone Encounter (Signed)
Pt came in and dropped off parking placard. He states it is time for renewal. I am sending back in your folder. Please call [phone] when complete for pick up.

## 2014-07-25 DIAGNOSIS — H25019 Cortical age-related cataract, unspecified eye: Secondary | ICD-10-CM | POA: Diagnosis not present

## 2014-07-25 DIAGNOSIS — I10 Essential (primary) hypertension: Secondary | ICD-10-CM | POA: Diagnosis not present

## 2014-07-25 DIAGNOSIS — H35039 Hypertensive retinopathy, unspecified eye: Secondary | ICD-10-CM | POA: Diagnosis not present

## 2014-07-25 DIAGNOSIS — H2510 Age-related nuclear cataract, unspecified eye: Secondary | ICD-10-CM | POA: Diagnosis not present

## 2014-07-26 ENCOUNTER — Encounter: Payer: Self-pay | Admitting: Gastroenterology

## 2014-07-30 DIAGNOSIS — Z7901 Long term (current) use of anticoagulants: Secondary | ICD-10-CM | POA: Diagnosis not present

## 2014-07-30 DIAGNOSIS — E039 Hypothyroidism, unspecified: Secondary | ICD-10-CM | POA: Diagnosis not present

## 2014-07-30 DIAGNOSIS — I119 Hypertensive heart disease without heart failure: Secondary | ICD-10-CM | POA: Diagnosis not present

## 2014-07-30 DIAGNOSIS — I4891 Unspecified atrial fibrillation: Secondary | ICD-10-CM | POA: Diagnosis not present

## 2014-09-06 ENCOUNTER — Other Ambulatory Visit: Payer: Self-pay | Admitting: Family Medicine

## 2014-09-06 NOTE — Telephone Encounter (Signed)
Is this okay?

## 2014-09-26 DIAGNOSIS — I10 Essential (primary) hypertension: Secondary | ICD-10-CM | POA: Diagnosis not present

## 2014-09-26 DIAGNOSIS — Z961 Presence of intraocular lens: Secondary | ICD-10-CM | POA: Diagnosis not present

## 2014-09-26 DIAGNOSIS — H40003 Preglaucoma, unspecified, bilateral: Secondary | ICD-10-CM | POA: Diagnosis not present

## 2014-09-26 DIAGNOSIS — H2512 Age-related nuclear cataract, left eye: Secondary | ICD-10-CM | POA: Diagnosis not present

## 2014-11-11 DIAGNOSIS — H25012 Cortical age-related cataract, left eye: Secondary | ICD-10-CM | POA: Diagnosis not present

## 2014-11-11 DIAGNOSIS — Z961 Presence of intraocular lens: Secondary | ICD-10-CM | POA: Diagnosis not present

## 2014-11-11 DIAGNOSIS — H2512 Age-related nuclear cataract, left eye: Secondary | ICD-10-CM | POA: Diagnosis not present

## 2014-11-11 DIAGNOSIS — H269 Unspecified cataract: Secondary | ICD-10-CM | POA: Diagnosis not present

## 2015-02-06 ENCOUNTER — Other Ambulatory Visit: Payer: Self-pay | Admitting: Family Medicine

## 2015-03-08 ENCOUNTER — Other Ambulatory Visit: Payer: Self-pay | Admitting: Family Medicine

## 2015-03-10 ENCOUNTER — Telehealth: Payer: Self-pay

## 2015-03-10 NOTE — Telephone Encounter (Signed)
Patient has made appointment for med check 04/22/15

## 2015-04-22 ENCOUNTER — Ambulatory Visit (INDEPENDENT_AMBULATORY_CARE_PROVIDER_SITE_OTHER): Payer: Medicare Other | Admitting: Family Medicine

## 2015-04-22 ENCOUNTER — Encounter: Payer: Self-pay | Admitting: Family Medicine

## 2015-04-22 VITALS — BP 132/82 | HR 52 | Wt 163.6 lb

## 2015-04-22 DIAGNOSIS — I4891 Unspecified atrial fibrillation: Secondary | ICD-10-CM | POA: Diagnosis not present

## 2015-04-22 DIAGNOSIS — I10 Essential (primary) hypertension: Secondary | ICD-10-CM | POA: Diagnosis not present

## 2015-04-22 DIAGNOSIS — E038 Other specified hypothyroidism: Secondary | ICD-10-CM

## 2015-04-22 DIAGNOSIS — Z23 Encounter for immunization: Secondary | ICD-10-CM | POA: Diagnosis not present

## 2015-04-22 DIAGNOSIS — E785 Hyperlipidemia, unspecified: Secondary | ICD-10-CM | POA: Diagnosis not present

## 2015-04-22 LAB — CBC WITH DIFFERENTIAL/PLATELET
BASOS ABS: 0.1 10*3/uL (ref 0.0–0.1)
BASOS PCT: 1 % (ref 0–1)
Eosinophils Absolute: 0.1 10*3/uL (ref 0.0–0.7)
Eosinophils Relative: 2 % (ref 0–5)
HEMATOCRIT: 47.8 % (ref 39.0–52.0)
Hemoglobin: 16.2 g/dL (ref 13.0–17.0)
LYMPHS ABS: 1 10*3/uL (ref 0.7–4.0)
Lymphocytes Relative: 19 % (ref 12–46)
MCH: 32.1 pg (ref 26.0–34.0)
MCHC: 33.9 g/dL (ref 30.0–36.0)
MCV: 94.7 fL (ref 78.0–100.0)
MONOS PCT: 12 % (ref 3–12)
MPV: 11.1 fL (ref 8.6–12.4)
Monocytes Absolute: 0.7 10*3/uL (ref 0.1–1.0)
NEUTROS ABS: 3.6 10*3/uL (ref 1.7–7.7)
Neutrophils Relative %: 66 % (ref 43–77)
Platelets: 311 10*3/uL (ref 150–400)
RBC: 5.05 MIL/uL (ref 4.22–5.81)
RDW: 14 % (ref 11.5–15.5)
WBC: 5.5 10*3/uL (ref 4.0–10.5)

## 2015-04-22 LAB — COMPREHENSIVE METABOLIC PANEL
ALT: 19 U/L (ref 0–53)
AST: 22 U/L (ref 0–37)
Albumin: 3.8 g/dL (ref 3.5–5.2)
Alkaline Phosphatase: 49 U/L (ref 39–117)
BUN: 10 mg/dL (ref 6–23)
CO2: 23 meq/L (ref 19–32)
Calcium: 9.2 mg/dL (ref 8.4–10.5)
Chloride: 103 mEq/L (ref 96–112)
Creat: 0.58 mg/dL (ref 0.50–1.35)
GLUCOSE: 102 mg/dL — AB (ref 70–99)
Potassium: 4.6 mEq/L (ref 3.5–5.3)
Sodium: 139 mEq/L (ref 135–145)
TOTAL PROTEIN: 6.8 g/dL (ref 6.0–8.3)
Total Bilirubin: 0.6 mg/dL (ref 0.2–1.2)

## 2015-04-22 LAB — LIPID PANEL
CHOLESTEROL: 178 mg/dL (ref 0–200)
HDL: 84 mg/dL (ref >=40)
LDL Cholesterol: 83 mg/dL (ref 0–99)
Total CHOL/HDL Ratio: 2.1 Ratio
Triglycerides: 57 mg/dL (ref <150)
VLDL: 11 mg/dL (ref 0–40)

## 2015-04-22 LAB — TSH: TSH: 1.905 u[IU]/mL (ref 0.350–4.500)

## 2015-04-22 NOTE — Progress Notes (Signed)
   Subjective:    Patient ID: Jeffrey Hardin, male    DOB: 1934-06-03, 81 y.o.   MRN: [id number]  HPI He is here for an interval evaluation. He has enjoyed excellent health. He has no particular concerns or complaints. No chest pain, shortness of breath, GI issues He was recently fitted for a new prosthetic on the left leg and is very happy with this. He stopped taking eloquence approximately one year ago stating difficulty with black stool and dizziness. He is not interested in going back on another medication. Continues on his blood pressure medication as well as Synthroid and is having no difficulty with this.   Review of Systems     Objective:   Physical Exam Alert and in no distress. Tympanic membranes and canals are normal. Pharyngeal area is normal. Neck is supple without adenopathy or thyromegaly. Cardiac exam shows a questionably regular  rhythm without murmurs or gallops. Lungs are clear to auscultation. Patient refused EKG       Assessment & Plan:  Other specified hypothyroidism - Plan: TSH  Essential hypertension - Plan: CBC with Differential/Platelet, Comprehensive metabolic panel  Hyperlipidemia with target LDL less than 100 - Plan: Lipid panel  Need for prophylactic vaccination against Streptococcus pneumoniae (pneumococcus) - Plan: Pneumococcal conjugate vaccine 13-valent  Atrial fibrillation, unspecified Shingles shot also recommended however he is not interested.

## 2015-04-23 MED ORDER — AMLODIPINE BESY-BENAZEPRIL HCL 10-20 MG PO CAPS: 1.0000 | ORAL_CAPSULE | Freq: Every day | ORAL | Status: DC

## 2015-04-23 MED ORDER — LEVOTHYROXINE SODIUM 100 MCG PO TABS: 100.0000 ug | ORAL_TABLET | Freq: Every day | ORAL | Status: DC

## 2015-04-23 NOTE — Addendum Note (Signed)
Addended by: [name] on: 04/23/2015 08:17 AM   Modules accepted: Orders

## 2015-05-12 ENCOUNTER — Encounter: Payer: Self-pay | Admitting: Cardiology

## 2015-05-12 DIAGNOSIS — I119 Hypertensive heart disease without heart failure: Secondary | ICD-10-CM | POA: Diagnosis not present

## 2015-05-12 DIAGNOSIS — I482 Chronic atrial fibrillation, unspecified: Secondary | ICD-10-CM

## 2015-05-12 DIAGNOSIS — Z7901 Long term (current) use of anticoagulants: Secondary | ICD-10-CM | POA: Diagnosis not present

## 2015-05-12 DIAGNOSIS — E039 Hypothyroidism, unspecified: Secondary | ICD-10-CM | POA: Diagnosis not present

## 2015-05-12 NOTE — Progress Notes (Signed)
Patient ID: Jeffrey Hardin, male   DOB: 14-Feb-1934, 81 y.o.   MRN: [id number]   Jeffrey, Hardin    Date of visit:  05/12/2015 DOB:  06/08/1934    Age:  81 yrs. Medical record number:  [id number]     Account number:  [id number] Primary Care Provider: Jill Alexanders C ____________________________ CURRENT DIAGNOSES  1. Chronic atrial fibrillation  2. Hypertensive heart disease without heart failure  3. Hypothyroidism, unspecified ____________________________ ALLERGIES  Sildenafil, Intolerance-unknown ____________________________ MEDICATIONS  1. amlodipine 10 mg-benazepril 20 mg capsule, 1 p.o. daily  2. Diprolene 0.05 % lotion, Take as directed  3. Levoxyl 100 mcg tablet, 1 p.o. daily  4. multivitamin tablet, 1 p.o. daily  5. vitamin E 1,000 unit capsule, 1 p.o. daily  6. Osteo Bi-Flex 250 mg-200 mg tablet, 1 p.o. daily  7. aspirin 81 mg tablet,delayed release, 1 p.o. daily ____________________________ CHIEF COMPLAINTS  Followup of Arrhythmia-Atrial Fibrillation  Pt stopped eliquis ____________________________ HISTORY OF PRESENT ILLNESS Patient returns for cardiac followup. He evidently quit taking Eliquus because he stated that it made his thinking feels fuzzy and he was then concerned about diarrhea and other symptoms. He evidently quit taking it even prior to his last visit here but could not tell me. He states he is not willing to take anticoagulation and is fully aware that he could have a stroke despite not taking it. He feels well. He denies angina and has no PND, orthopnea, syncope, or claudication. ____________________________ PAST HISTORY  Past Medical Illnesses:  hypertension, hypothyroidism, history of synovial sarcoma 1957;  Cardiovascular Illnesses:  atrial fibrillation-chronic;  Surgical Procedures:  amputation l leg, tonsillectomy;  NYHA Classification:  I;  Canadian Angina Classification:  Class 0: Asymptomatic;  Cardiology Procedures-Invasive:  no history of prior cardiac  procedures;  Cardiology Procedures-Noninvasive:  echocardiogram December 2014;  LVEF of 55% documented via echocardiogram on 12/19/2013,   ____________________________ CARDIO-PULMONARY TEST DATES EKG Date:  07/30/2014;  Echocardiography Date: 12/19/2013;  Chest Xray Date: 12/14/2013;   ____________________________ FAMILY HISTORY Brother -- Transplantation of liver, Brother alive with problem Father -- Father dead, Pulmonary emphysema Mother -- Mother dead, Death of unknown cause Sister -- Sister alive and well Sister -- Sister alive and well Sister -- Sister dead, Leukemia Sister -- Sister alive and well ____________________________ SOCIAL HISTORY Alcohol Use:  beer 1 6-pack per day;  Smoking:  never smoked;  Diet:  regular diet;  Lifestyle:  married;  Exercise:  yard work;  Occupation:  Chief Financial Officer;  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 Eyes: wears eye glasses/contact lenses Respiratory: denies dyspnea, cough, wheezing or hemoptysis. Cardiovascular:  please review HPI Abdominal: denies dyspepsia, GI bleeding, constipation, or diarrhea Genitourinary-Male: erectile dysfunction  Musculoskeletal:  denies arthritis, venous insufficiency, or muscle weakness. Neurological:  denies headaches, stroke, or TIA  ____________________________ PHYSICAL EXAMINATION VITAL SIGNS  Blood Pressure:  132/70 Sitting, Right arm, regular cuff  , 136/74 Standing, Right arm and regular cuff   Pulse:  68/min. Weight:  165.00 lbs. Height:  68"BMI: 25  Constitutional:  pleasant white male in no acute distress Skin:  warm and dry to touch, no apparent skin lesions, or masses noted. Head:  normocephalic, balding male hair pattern Chest:  normal symmetry, clear to auscultation. Cardiac:  irregular rhythm, normal S1 and S2, no S3 or S4, no murmur Peripheral Pulses:  the femoral,dorsalis pedis, and  posterior tibial pulses are full and equal bilaterally  with no bruits auscultated. Extremities & Back:  left AKA, 1+ edema, RLE venous insufficiency changes present Neurological:  no gross motor or sensory deficits noted, affect appropriate, oriented x3. ____________________________ MOST RECENT LIPID PANEL 09/11/13  CHOL TOTL 226 mg/dl, LDL 138 NM, HDL 75 mg/dl, TRIGLYCER 66 mg/dl and CHOL/HDL 3.0 (Calc) ____________________________ IMPRESSIONS/PLAN 1. Chronic atrial fibrillation currently rate controlled 2. Long-term use of anticoagulation 3. Hypertensive heart disease  Recommendations:  Again a long discussion about stroke risk and he refuses to take oral anticoagulants. He will take a low-dose aspirin. I will see him in followup in a year and was to call if there are recurrent problems. ____________________________ TODAYS ORDERS  1. Return Visit: 1 year  2. 12 Lead EKG: 1 year                       ____________________________ Cardiology Physician:  Kerry Hough MD [facility]

## 2016-03-05 ENCOUNTER — Encounter: Payer: Self-pay | Admitting: *Deleted

## 2016-04-15 ENCOUNTER — Other Ambulatory Visit: Payer: Self-pay | Admitting: Family Medicine

## 2016-05-01 ENCOUNTER — Other Ambulatory Visit: Payer: Self-pay | Admitting: Family Medicine

## 2016-11-02 ENCOUNTER — Other Ambulatory Visit (INDEPENDENT_AMBULATORY_CARE_PROVIDER_SITE_OTHER): Payer: Medicare Other

## 2016-11-02 DIAGNOSIS — Z23 Encounter for immunization: Secondary | ICD-10-CM | POA: Diagnosis not present

## 2016-11-08 ENCOUNTER — Telehealth: Payer: Self-pay | Admitting: Family Medicine

## 2016-11-08 MED ORDER — BETAMETHASONE DIPROPIONATE 0.05 % EX OINT: TOPICAL_OINTMENT | CUTANEOUS | 5 refills | Status: DC

## 2016-11-08 NOTE — Telephone Encounter (Signed)
Pt called and states that he needs a refill on his betamethasone dipropionate ointment, pt uses [id number] - [place], [place] - [address]. And pt can be reached at [phone] with any questions

## 2017-01-05 ENCOUNTER — Encounter: Payer: Self-pay | Admitting: Family Medicine

## 2017-01-05 ENCOUNTER — Ambulatory Visit (INDEPENDENT_AMBULATORY_CARE_PROVIDER_SITE_OTHER): Payer: Medicare Other | Admitting: Family Medicine

## 2017-01-05 VITALS — BP 122/70 | HR 57 | Ht 68.0 in | Wt 156.0 lb

## 2017-01-05 DIAGNOSIS — Z85830 Personal history of malignant neoplasm of bone: Secondary | ICD-10-CM | POA: Diagnosis not present

## 2017-01-05 DIAGNOSIS — E039 Hypothyroidism, unspecified: Secondary | ICD-10-CM

## 2017-01-05 DIAGNOSIS — I119 Hypertensive heart disease without heart failure: Secondary | ICD-10-CM | POA: Diagnosis not present

## 2017-01-05 DIAGNOSIS — Z8679 Personal history of other diseases of the circulatory system: Secondary | ICD-10-CM

## 2017-01-05 DIAGNOSIS — Z79899 Other long term (current) drug therapy: Secondary | ICD-10-CM

## 2017-01-05 DIAGNOSIS — E785 Hyperlipidemia, unspecified: Secondary | ICD-10-CM | POA: Diagnosis not present

## 2017-01-05 LAB — CBC WITH DIFFERENTIAL/PLATELET
Basophils Absolute: 51 cells/uL (ref 0–200)
Basophils Relative: 1 %
Eosinophils Absolute: 51 cells/uL (ref 15–500)
Eosinophils Relative: 1 %
HCT: 46.9 % (ref 38.5–50.0)
HEMOGLOBIN: 16 g/dL (ref 13.2–17.1)
LYMPHS ABS: 1071 {cells}/uL (ref 850–3900)
LYMPHS PCT: 21 %
MCH: 33.1 pg — ABNORMAL HIGH (ref 27.0–33.0)
MCHC: 34.1 g/dL (ref 32.0–36.0)
MCV: 97.1 fL (ref 80.0–100.0)
MPV: 10.7 fL (ref 7.5–12.5)
Monocytes Absolute: 561 cells/uL (ref 200–950)
Monocytes Relative: 11 %
NEUTROS PCT: 66 %
Neutro Abs: 3366 cells/uL (ref 1500–7800)
Platelets: 310 10*3/uL (ref 140–400)
RBC: 4.83 MIL/uL (ref 4.20–5.80)
RDW: 14 % (ref 11.0–15.0)
WBC: 5.1 10*3/uL (ref 4.0–10.5)

## 2017-01-05 LAB — COMPREHENSIVE METABOLIC PANEL
ALT: 14 U/L (ref 9–46)
AST: 20 U/L (ref 10–35)
Albumin: 3.9 g/dL (ref 3.6–5.1)
Alkaline Phosphatase: 45 U/L (ref 40–115)
BILIRUBIN TOTAL: 0.6 mg/dL (ref 0.2–1.2)
BUN: 10 mg/dL (ref 7–25)
CO2: 25 mmol/L (ref 20–31)
CREATININE: 0.63 mg/dL — AB (ref 0.70–1.11)
Calcium: 9.3 mg/dL (ref 8.6–10.3)
Chloride: 101 mmol/L (ref 98–110)
GLUCOSE: 102 mg/dL — AB (ref 65–99)
Potassium: 4.4 mmol/L (ref 3.5–5.3)
Sodium: 137 mmol/L (ref 135–146)
Total Protein: 6.9 g/dL (ref 6.1–8.1)

## 2017-01-05 LAB — LIPID PANEL
Cholesterol: 191 mg/dL (ref <200)
HDL: 87 mg/dL (ref >40)
LDL CALC: 94 mg/dL (ref <100)
Total CHOL/HDL Ratio: 2.2 Ratio (ref <5.0)
Triglycerides: 51 mg/dL (ref <150)
VLDL: 10 mg/dL (ref <30)

## 2017-01-05 LAB — TSH: TSH: 1.15 m[IU]/L (ref 0.40–4.50)

## 2017-01-05 MED ORDER — LEVOTHYROXINE SODIUM 100 MCG PO TABS: 100.0000 ug | ORAL_TABLET | Freq: Every day | ORAL | 3 refills | Status: DC

## 2017-01-05 MED ORDER — AMLODIPINE BESY-BENAZEPRIL HCL 10-20 MG PO CAPS
1.0000 | ORAL_CAPSULE | Freq: Every day | ORAL | 3 refills | Status: DC
Start: 2017-01-05 — End: 2018-04-06

## 2017-01-05 NOTE — Progress Notes (Signed)
   Subjective:    Patient ID: Jeffrey Hardin, male    DOB: [birth year], 83 y.o.   MRN: [id number]  HPI He is here for a medication check. He does have a previous history of hypertensive heart disease as well as atrial fibrillation. He was last seen by Dr. Wynonia Lawman in 2016. He has not taking any anticoagulation. He's had no chest pain, shortness of breath, PND or DOE. He continues on his thyroid medication as well as Lotrel. He has no complaints with these. He also takes a multivitamin. He does not smoke and drinks up to a 6 pack per day and has done this for several decades. He has no inclination or desire to stop this. He is retired. He and his wife get along quite nicely. Himself physically and mentally busy. Health maintenance, immunizations and social as well as family history were reviewed  Review of Systems     Objective:   Physical Exam Alert and in no distress. Tympanic membranes and canals are normal. Pharyngeal area is normal. Neck is supple without adenopathy or thyromegaly. Cardiac exam shows a regular sinus rhythm without murmurs or gallops. Lungs are clear to auscultation.        Assessment & Plan:  Hypothyroidism, unspecified type  Hypertensive heart disease without heart failure  Hyperlipidemia with target LDL less than 100  History of atrial fibrillation  Encounter for long-term (current) use of medications  History of osteosarcoma - Left leg removed when he was in his early 8s Discussed the need for him to cut back on alcohol and he has no plans to do that. His heart rate today is a regular rhythm. He has no interest in changing any of his medications or lifestyle. I will renew his medications and do blood work today. His weight is down however this is because of a new artificial leg

## 2017-09-27 ENCOUNTER — Encounter: Payer: Self-pay | Admitting: Internal Medicine

## 2017-09-27 ENCOUNTER — Ambulatory Visit (INDEPENDENT_AMBULATORY_CARE_PROVIDER_SITE_OTHER): Payer: Medicare Other | Admitting: Family Medicine

## 2017-09-27 VITALS — BP 138/80 | HR 60 | Wt 149.6 lb

## 2017-09-27 DIAGNOSIS — Z23 Encounter for immunization: Secondary | ICD-10-CM

## 2017-09-27 DIAGNOSIS — L929 Granulomatous disorder of the skin and subcutaneous tissue, unspecified: Secondary | ICD-10-CM | POA: Diagnosis not present

## 2017-09-27 NOTE — Progress Notes (Signed)
   Subjective:    Patient ID: Jeffrey Hardin, male    DOB: August 29, 1934, 83 y.o.   MRN: [id number]  HPI He is here for consult concerning a wound that has developed from wearing his prosthetic device. He keeps the appliance on very tightly and it has caused some chronic skin changes to the right side of his lower abdominal/pelvic area. Over the last several months he has noted rawness substance tissue and lack of good healing.   Review of Systems     Objective:   Physical Exam Alert and in no distress. Exam of the right lower quadrant and pelvic area shows chronic skin changes consisting of slight dryness of skin and erythema and he has a 4 cm rounded area of granulation tissue.       Assessment & Plan:  Granulation tissue abnormality - Plan: AMB referral to wound care center  Need for influenza vaccination - Plan: Flu vaccine HIGH DOSE PF (Fluzone High dose)  He will need to wound care for this and will also work with biotech to also see if there is anything that can be done to help with better padding of the area or possibly changed in some of the other parts of his prosthesis. He will keep me informed as to the progress of this. At this point it does not appear infected.

## 2017-11-01 ENCOUNTER — Encounter (HOSPITAL_BASED_OUTPATIENT_CLINIC_OR_DEPARTMENT_OTHER): Payer: Medicare Other | Attending: Surgery

## 2017-11-01 ENCOUNTER — Other Ambulatory Visit: Payer: Self-pay | Admitting: Certified Registered Nurse Anesthetist

## 2017-11-01 DIAGNOSIS — C44529 Squamous cell carcinoma of skin of other part of trunk: Secondary | ICD-10-CM | POA: Diagnosis not present

## 2017-11-01 DIAGNOSIS — Z85830 Personal history of malignant neoplasm of bone: Secondary | ICD-10-CM | POA: Diagnosis not present

## 2017-11-01 DIAGNOSIS — I4891 Unspecified atrial fibrillation: Secondary | ICD-10-CM | POA: Insufficient documentation

## 2017-11-01 DIAGNOSIS — Z89612 Acquired absence of left leg above knee: Secondary | ICD-10-CM | POA: Diagnosis not present

## 2017-11-01 DIAGNOSIS — L89899 Pressure ulcer of other site, unspecified stage: Secondary | ICD-10-CM | POA: Diagnosis not present

## 2017-11-01 DIAGNOSIS — I119 Hypertensive heart disease without heart failure: Secondary | ICD-10-CM | POA: Diagnosis not present

## 2017-11-01 DIAGNOSIS — Z923 Personal history of irradiation: Secondary | ICD-10-CM | POA: Insufficient documentation

## 2017-11-01 DIAGNOSIS — S31103A Unspecified open wound of abdominal wall, right lower quadrant without penetration into peritoneal cavity, initial encounter: Secondary | ICD-10-CM | POA: Diagnosis present

## 2017-11-08 DIAGNOSIS — I4891 Unspecified atrial fibrillation: Secondary | ICD-10-CM | POA: Diagnosis not present

## 2017-11-08 DIAGNOSIS — Z923 Personal history of irradiation: Secondary | ICD-10-CM | POA: Diagnosis not present

## 2017-11-08 DIAGNOSIS — I119 Hypertensive heart disease without heart failure: Secondary | ICD-10-CM | POA: Diagnosis not present

## 2017-11-08 DIAGNOSIS — Z85830 Personal history of malignant neoplasm of bone: Secondary | ICD-10-CM | POA: Diagnosis not present

## 2017-11-08 DIAGNOSIS — Z89612 Acquired absence of left leg above knee: Secondary | ICD-10-CM | POA: Diagnosis not present

## 2017-11-08 DIAGNOSIS — C44529 Squamous cell carcinoma of skin of other part of trunk: Secondary | ICD-10-CM | POA: Diagnosis not present

## 2017-11-09 ENCOUNTER — Ambulatory Visit (INDEPENDENT_AMBULATORY_CARE_PROVIDER_SITE_OTHER): Payer: Medicare Other | Admitting: Family Medicine

## 2017-11-09 DIAGNOSIS — C4492 Squamous cell carcinoma of skin, unspecified: Secondary | ICD-10-CM

## 2017-11-09 NOTE — Progress Notes (Signed)
   Subjective:    Patient ID: Jeffrey Hardin, male    DOB: 03/26/1934, 83 y.o.   MRN: [id number]  HPI He is here for consultation concerning difficulty with the skin in his prosthetic area.  He has had a wound that was poorly healing.  Biopsies were done which did show evidence of moderately to poorly differentiated squamous cell cancer.  He is concerned over the accuracy of the diagnosis.   Review of Systems     Objective:   Physical Exam Alert and in no distress otherwise not examined.  The pathology report as well as the note from wound care center was reviewed.       Assessment & Plan:  Squamous cell skin cancer  I have asked for a second opinion on getting the slides read.  If it indeed is the same diagnosis, he plans to go to the [place] at [facility] to have the procedure done.  He has a good understanding of how to protect the area and still be able to use his prosthetic device.  I did however recommend that after he has a surgery, this needs to be verified with his prosthetic people here in town as well as the wound care clinic.  He is comfortable with that.

## 2017-11-24 ENCOUNTER — Telehealth: Payer: Self-pay | Admitting: Family Medicine

## 2017-11-24 NOTE — Telephone Encounter (Signed)
Pt left message that he was waiting to hear back regarding the biopsy report.  Please call him back as soon as possible regarding the second opinion.

## 2017-11-24 NOTE — Telephone Encounter (Signed)
I called and asked for a different pathologist to read the specimen and do not see any note about that.  Call over and get someone to read it and then fax the note back to me.  Explained to the patient that they did not do what I asked.  Let us stay on top of this.

## 2017-11-25 NOTE — Telephone Encounter (Signed)
Spoke with pt- he was made aware that we are working on a Sales promotion account executive to get this read. Spoke with Judene Companion at [facility] and informed her that we do NOT want second opinion from Dr. Martinique, but from a different pathologist. They are hoping to send the pathology smear local to [place] and have a f/u report within 1 week. Will f/u until second opinion is rcvd. Victorino December

## 2017-11-29 NOTE — Telephone Encounter (Signed)
Called to f/u on path report. Apparently path was sent BACK to Dr. Martinique when it was requested to get a DIFFERENT pathologist opinion.  Spoke with Missy this morning at client services of [facility] and she was asked to expedite this request. She said it maybe tomorrow before we get a report back. Advised to please get it done ASAP!

## 2017-11-29 NOTE — Telephone Encounter (Signed)
Call back from [place]- slide has been sent to Dr Lyndon Code to be read today!

## 2017-11-30 NOTE — Telephone Encounter (Signed)
LM for Missy to CB.

## 2017-12-01 NOTE — Telephone Encounter (Signed)
2nd opinion placed on your desk. Jeffrey Hardin

## 2017-12-01 NOTE — Telephone Encounter (Signed)
Pt notified of path report.  He states he is going to wait until after 1st of the year to get it removed. He is going to [place] for treatment. He will call back with provider name and fax so that we can forward path report to them.

## 2018-01-18 DIAGNOSIS — Z923 Personal history of irradiation: Secondary | ICD-10-CM | POA: Diagnosis not present

## 2018-01-18 DIAGNOSIS — C44529 Squamous cell carcinoma of skin of other part of trunk: Secondary | ICD-10-CM | POA: Diagnosis not present

## 2018-01-18 DIAGNOSIS — Z89622 Acquired absence of left hip joint: Secondary | ICD-10-CM | POA: Diagnosis not present

## 2018-01-18 DIAGNOSIS — Z85831 Personal history of malignant neoplasm of soft tissue: Secondary | ICD-10-CM | POA: Diagnosis not present

## 2018-01-19 NOTE — H&P (Signed)
  Subjective:     Patient ID: Jeffrey Hardin is a 84 y.o. male.  HPI  Referred from [place] for evaluation. Onset wound abdomen 5 months ago. 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 Has worn LLE prosthesis since that time, incurred multiple wounds abdomen due to pressure injury from prosthesis.  This lesion is in area of his prosthesis which he continues to wear. Biopsy done 11.6.18 with poorly differentiated SCC. Patient was referred at that time for surgical excision and pt declined. He was seen by PCP requesting second opinion. The biopsy slides reviewed by another provider and agree with SCC diagnosis.  PMH includes history atrial fibrillation, patient declined anticoagulation. Former Runner, broadcasting/film/video in [employer].  Review of Systems  Skin: Positive for wound.   Remaidner 12 point review negative    Objective:   Physical Exam  Constitutional: He is oriented to person, place, and time.  Cardiovascular: Normal heart sounds.   Pulmonary/Chest: Effort normal and breath sounds normal.  Abdominal: Soft.  RLQ with ulcerated hypertrophic mass 8 x 9 cm  Neurological: He is alert and oriented to person, place, and time.       Assessment:     SCC abdomen    Plan:     Patient reports when he was diagnosed with synovial ca was told he would die in 6 months, requested another opinion at [facility]. Not suprisingly he has done same with this but is desiring excision. Appears area could be primarily closed may require resection umbilicus and patient ok with this. Counseled cannot wear prosthesis until well healed at least 4 weeks.  Declined anticoagulation for a fib. Last seen Dr. Wynonia Lawman 2016 who recommended ASA. Last EKG in [facility] chart 2014 with a fib and possible septal infarct old.   Will try to plan excision next week, plan OP surgery, possible drain.  Irene Limbo, MD [facility] Plastic &  Reconstructive Surgery [phone], pin [phone]

## 2018-01-23 ENCOUNTER — Encounter (HOSPITAL_COMMUNITY): Payer: Self-pay | Admitting: *Deleted

## 2018-01-23 NOTE — Anesthesia Preprocedure Evaluation (Addendum)
Anesthesia Evaluation   Patient awake    Reviewed: Allergy & Precautions, NPO status , Patient's Chart, lab work & pertinent test results  Airway Mallampati: II  TM Distance: >3 FB Neck ROM: Full    Dental  (+) Dental Advisory Given, Partial Upper   Pulmonary    breath sounds clear to auscultation       Cardiovascular hypertension, Pt. on medications + dysrhythmias Atrial Fibrillation  Rhythm:Irregular Rate:Bradycardia     Neuro/Psych negative neurological ROS  negative psych ROS   GI/Hepatic negative GI ROS, Neg liver ROS,   Endo/Other  Hypothyroidism   Renal/GU negative Renal ROS     Musculoskeletal negative musculoskeletal ROS (+)   Abdominal   Peds  Hematology   Anesthesia Other Findings   Reproductive/Obstetrics                            Anesthesia Physical Anesthesia Plan  ASA: III  Anesthesia Plan: General   Post-op Pain Management:    Induction: Intravenous  PONV Risk Score and Plan: 3 and Ondansetron, Dexamethasone and Treatment may vary due to age or medical condition  Airway Management Planned: Oral ETT  Additional Equipment: None  Intra-op Plan:   Post-operative Plan: Extubation in OR  Informed Consent: I have reviewed the patients History and Physical, chart, labs and discussed the procedure including the risks, benefits and alternatives for the proposed anesthesia with the patient or authorized representative who has indicated his/her understanding and acceptance.   Dental advisory given  Plan Discussed with: CRNA  Anesthesia Plan Comments:        Anesthesia Quick Evaluation

## 2018-01-23 NOTE — Progress Notes (Signed)
Denies chest pain or shortness of breath. Reports that he does not see a cardiologist. Spoke to Dr. Dian Queen and patient will be evaluated in the morning by assigned anesthesiologist.

## 2018-01-24 ENCOUNTER — Ambulatory Visit (HOSPITAL_COMMUNITY)
Admission: RE | Admit: 2018-01-24 | Discharge: 2018-01-24 | Disposition: A | Discharge status: Home / Self Care | Payer: Medicare Other | Source: Ambulatory Visit | Attending: Plastic Surgery | Admitting: Plastic Surgery

## 2018-01-24 ENCOUNTER — Ambulatory Visit (HOSPITAL_COMMUNITY): Payer: Medicare Other | Admitting: Certified Registered"

## 2018-01-24 ENCOUNTER — Encounter (HOSPITAL_COMMUNITY): Payer: Self-pay | Admitting: *Deleted

## 2018-01-24 ENCOUNTER — Encounter (HOSPITAL_COMMUNITY)
Admission: RE | Disposition: A | Discharge status: Home / Self Care | Payer: Self-pay | Source: Ambulatory Visit | Attending: Plastic Surgery

## 2018-01-24 DIAGNOSIS — I4891 Unspecified atrial fibrillation: Secondary | ICD-10-CM | POA: Insufficient documentation

## 2018-01-24 DIAGNOSIS — E039 Hypothyroidism, unspecified: Secondary | ICD-10-CM | POA: Insufficient documentation

## 2018-01-24 DIAGNOSIS — R001 Bradycardia, unspecified: Secondary | ICD-10-CM | POA: Insufficient documentation

## 2018-01-24 DIAGNOSIS — C44529 Squamous cell carcinoma of skin of other part of trunk: Secondary | ICD-10-CM | POA: Diagnosis not present

## 2018-01-24 DIAGNOSIS — I119 Hypertensive heart disease without heart failure: Secondary | ICD-10-CM | POA: Diagnosis not present

## 2018-01-24 DIAGNOSIS — I10 Essential (primary) hypertension: Secondary | ICD-10-CM | POA: Insufficient documentation

## 2018-01-24 DIAGNOSIS — Z85831 Personal history of malignant neoplasm of soft tissue: Secondary | ICD-10-CM | POA: Diagnosis not present

## 2018-01-24 DIAGNOSIS — Z89622 Acquired absence of left hip joint: Secondary | ICD-10-CM | POA: Diagnosis not present

## 2018-01-24 DIAGNOSIS — E785 Hyperlipidemia, unspecified: Secondary | ICD-10-CM | POA: Diagnosis not present

## 2018-01-24 HISTORY — DX: Unspecified atrial fibrillation: I48.91

## 2018-01-24 HISTORY — DX: Hypothyroidism, unspecified: E03.9

## 2018-01-24 HISTORY — PX: MASS EXCISION: SHX2000

## 2018-01-24 HISTORY — DX: Squamous cell carcinoma of skin, unspecified: C44.92

## 2018-01-24 LAB — PROTIME-INR
INR: 1.1
Prothrombin Time: 14.1 seconds (ref 11.4–15.2)

## 2018-01-24 LAB — COMPREHENSIVE METABOLIC PANEL
ALBUMIN: 3 g/dL — AB (ref 3.5–5.0)
ALK PHOS: 48 U/L (ref 38–126)
ALT: 12 U/L — AB (ref 17–63)
AST: 18 U/L (ref 15–41)
Anion gap: 8 (ref 5–15)
BILIRUBIN TOTAL: 0.5 mg/dL (ref 0.3–1.2)
BUN: 6 mg/dL (ref 6–20)
CO2: 24 mmol/L (ref 22–32)
CREATININE: 0.63 mg/dL (ref 0.61–1.24)
Calcium: 8.7 mg/dL — ABNORMAL LOW (ref 8.9–10.3)
Chloride: 102 mmol/L (ref 101–111)
GFR calc Af Amer: >60 mL/min (ref >60)
GLUCOSE: 115 mg/dL — AB (ref 65–99)
Potassium: 4 mmol/L (ref 3.5–5.1)
Sodium: 134 mmol/L — ABNORMAL LOW (ref 135–145)
Total Protein: 6.1 g/dL — ABNORMAL LOW (ref 6.5–8.1)

## 2018-01-24 SURGERY — EXCISION MASS
Anesthesia: General | Site: Abdomen

## 2018-01-24 MED ORDER — ONDANSETRON HCL 4 MG/2ML IJ SOLN
INTRAMUSCULAR | Status: DC | PRN
  Administered 2018-01-24: 4 mg via INTRAVENOUS

## 2018-01-24 MED ORDER — 0.9 % SODIUM CHLORIDE (POUR BTL) OPTIME
TOPICAL | Status: DC | PRN
  Administered 2018-01-24: 1000 mL

## 2018-01-24 MED ORDER — LACTATED RINGERS IV SOLN
INTRAVENOUS | Status: DC | PRN
  Administered 2018-01-24: 07:00:00 via INTRAVENOUS

## 2018-01-24 MED ORDER — CHLORHEXIDINE GLUCONATE CLOTH 2 % EX PADS: 6.0000 | MEDICATED_PAD | Freq: Once | CUTANEOUS | Status: DC

## 2018-01-24 MED ORDER — LIDOCAINE 2% (20 MG/ML) 5 ML SYRINGE
INTRAMUSCULAR | Status: DC | PRN
  Administered 2018-01-24: 80 mg via INTRAVENOUS

## 2018-01-24 MED ORDER — FENTANYL CITRATE (PF) 250 MCG/5ML IJ SOLN
INTRAMUSCULAR | Status: AC
  Filled 2018-01-24: qty 5

## 2018-01-24 MED ORDER — BUPIVACAINE HCL (PF) 0.25 % IJ SOLN
INTRAMUSCULAR | Status: AC
  Filled 2018-01-24: qty 30

## 2018-01-24 MED ORDER — LACTATED RINGERS IV SOLN: INTRAVENOUS | Status: DC

## 2018-01-24 MED ORDER — FENTANYL CITRATE (PF) 100 MCG/2ML IJ SOLN
INTRAMUSCULAR | Status: DC | PRN
  Administered 2018-01-24: 50 ug via INTRAVENOUS
  Administered 2018-01-24: 25 ug via INTRAVENOUS
  Administered 2018-01-24 (×2): 50 ug via INTRAVENOUS
  Administered 2018-01-24: 25 ug via INTRAVENOUS

## 2018-01-24 MED ORDER — ROCURONIUM BROMIDE 10 MG/ML (PF) SYRINGE
PREFILLED_SYRINGE | INTRAVENOUS | Status: DC | PRN
  Administered 2018-01-24: 50 mg via INTRAVENOUS

## 2018-01-24 MED ORDER — LABETALOL HCL 5 MG/ML IV SOLN
INTRAVENOUS | Status: DC | PRN
  Administered 2018-01-24: 2.5 mg via INTRAVENOUS

## 2018-01-24 MED ORDER — MEPERIDINE HCL 25 MG/ML IJ SOLN: 6.2500 mg | INTRAMUSCULAR | Status: DC | PRN

## 2018-01-24 MED ORDER — SUGAMMADEX SODIUM 200 MG/2ML IV SOLN
INTRAVENOUS | Status: DC | PRN
  Administered 2018-01-24: 150 mg via INTRAVENOUS

## 2018-01-24 MED ORDER — TRAMADOL HCL 50 MG PO TABS: 50.0000 mg | ORAL_TABLET | Freq: Two times a day (BID) | ORAL | 0 refills | Status: AC | PRN

## 2018-01-24 MED ORDER — BUPIVACAINE HCL 0.25 % IJ SOLN
INTRAMUSCULAR | Status: DC | PRN
  Administered 2018-01-24: 27 mL

## 2018-01-24 MED ORDER — PROPOFOL 10 MG/ML IV BOLUS
INTRAVENOUS | Status: AC
  Filled 2018-01-24: qty 20

## 2018-01-24 MED ORDER — HEPARIN SODIUM (PORCINE) 5000 UNIT/ML IJ SOLN
INTRAMUSCULAR | Status: AC
  Administered 2018-01-24: 5000 [IU]
  Filled 2018-01-24: qty 1

## 2018-01-24 MED ORDER — DEXAMETHASONE SODIUM PHOSPHATE 10 MG/ML IJ SOLN
INTRAMUSCULAR | Status: DC | PRN
  Administered 2018-01-24: 10 mg via INTRAVENOUS

## 2018-01-24 MED ORDER — PROPOFOL 10 MG/ML IV BOLUS
INTRAVENOUS | Status: DC | PRN
  Administered 2018-01-24: 130 mg via INTRAVENOUS

## 2018-01-24 MED ORDER — HYDROMORPHONE HCL 1 MG/ML IJ SOLN: 0.2500 mg | INTRAMUSCULAR | Status: DC | PRN

## 2018-01-24 MED ORDER — PROMETHAZINE HCL 25 MG/ML IJ SOLN: 6.2500 mg | INTRAMUSCULAR | Status: DC | PRN

## 2018-01-24 MED ORDER — CEFAZOLIN SODIUM-DEXTROSE 2-4 GM/100ML-% IV SOLN
2.0000 g | INTRAVENOUS | Status: AC
  Administered 2018-01-24: 2 g via INTRAVENOUS
  Filled 2018-01-24: qty 100

## 2018-01-24 SURGICAL SUPPLY — 69 items
ADH SKN CLS APL DERMABOND .7 (GAUZE/BANDAGES/DRESSINGS) ×1
APL SKNCLS STERI-STRIP NONHPOA (GAUZE/BANDAGES/DRESSINGS)
BENZOIN TINCTURE PRP APPL 2/3 (GAUZE/BANDAGES/DRESSINGS) IMPLANT
BLADE CLIPPER SURG (BLADE) ×2 IMPLANT
BLADE SURG 11 STRL SS (BLADE) IMPLANT
BLADE SURG 15 STRL LF DISP TIS (BLADE) ×1 IMPLANT
BLADE SURG 15 STRL SS (BLADE) ×3
CANISTER SUCT 1200ML W/VALVE (MISCELLANEOUS) IMPLANT
CHLORAPREP W/TINT 26ML (MISCELLANEOUS) ×1 IMPLANT
CLOSURE WOUND 1/2 X4 (GAUZE/BANDAGES/DRESSINGS)
COVER BACK TABLE 60X90IN (DRAPES) ×1 IMPLANT
COVER MAYO STAND STRL (DRAPES) ×1 IMPLANT
DERMABOND ADVANCED (GAUZE/BANDAGES/DRESSINGS) ×2
DERMABOND ADVANCED .7 DNX12 (GAUZE/BANDAGES/DRESSINGS) IMPLANT
DRAIN CHANNEL 15F RND FF W/TCR (WOUND CARE) ×2 IMPLANT
DRAIN SNY 10 ROU (WOUND CARE) IMPLANT
DRAPE ORTHO SPLIT 87X125 STRL (DRAPES) ×6 IMPLANT
DRAPE PED LAPAROTOMY (DRAPES) IMPLANT
DRAPE U-SHAPE 76X120 STRL (DRAPES) ×1 IMPLANT
ELECT COATED BLADE 2.86 ST (ELECTRODE) ×2 IMPLANT
ELECT NDL BLADE 2-5/6 (NEEDLE) ×1 IMPLANT
ELECT NEEDLE BLADE 2-5/6 (NEEDLE) ×3 IMPLANT
ELECT REM PT RETURN 9FT ADLT (ELECTROSURGICAL) ×3
ELECT REM PT RETURN 9FT PED (ELECTROSURGICAL)
ELECTRODE REM PT RETRN 9FT PED (ELECTROSURGICAL) IMPLANT
ELECTRODE REM PT RTRN 9FT ADLT (ELECTROSURGICAL) IMPLANT
EVACUATOR SILICONE 100CC (DRAIN) ×2 IMPLANT
GAUZE SPONGE 2X2 8PLY STRL LF (GAUZE/BANDAGES/DRESSINGS) IMPLANT
GAUZE XEROFORM 1X8 LF (GAUZE/BANDAGES/DRESSINGS) IMPLANT
GLOVE BIO SURGEON STRL SZ 6 (GLOVE) ×5 IMPLANT
GLOVE BIOGEL PI IND STRL 7.0 (GLOVE) IMPLANT
GLOVE BIOGEL PI INDICATOR 7.0 (GLOVE) ×2
GLOVE SURG SS PI 7.0 STRL IVOR (GLOVE) ×2 IMPLANT
GOWN STRL REUS W/ TWL LRG LVL3 (GOWN DISPOSABLE) ×2 IMPLANT
GOWN STRL REUS W/TWL LRG LVL3 (GOWN DISPOSABLE) ×6
KIT BASIN OR (CUSTOM PROCEDURE TRAY) ×3 IMPLANT
NDL HYPO 30X.5 LL (NEEDLE) IMPLANT
NEEDLE HYPO 25GX1X1/2 BEV (NEEDLE) ×3 IMPLANT
NEEDLE HYPO 30X.5 LL (NEEDLE) IMPLANT
NEEDLE PRECISIONGLIDE 27X1.5 (NEEDLE) ×3 IMPLANT
NS IRRIG 1000ML POUR BTL (IV SOLUTION) ×4 IMPLANT
PAD ABD 8X10 STRL (GAUZE/BANDAGES/DRESSINGS) ×3 IMPLANT
PENCIL BUTTON HOLSTER BLD 10FT (ELECTRODE) ×3 IMPLANT
RUBBERBAND STERILE (MISCELLANEOUS) IMPLANT
SHEET MEDIUM DRAPE 40X70 STRL (DRAPES) IMPLANT
SPONGE GAUZE 2X2 STER 10/PKG (GAUZE/BANDAGES/DRESSINGS)
SPONGE GAUZE 4X4 12PLY STER LF (GAUZE/BANDAGES/DRESSINGS) IMPLANT
SPONGE LAP 18X18 X RAY DECT (DISPOSABLE) IMPLANT
STAPLER VISISTAT 35W (STAPLE) ×2 IMPLANT
STRIP CLOSURE SKIN 1/2X4 (GAUZE/BANDAGES/DRESSINGS) IMPLANT
SUCTION FRAZIER HANDLE 10FR (MISCELLANEOUS)
SUCTION TUBE FRAZIER 10FR DISP (MISCELLANEOUS) IMPLANT
SUT ETHILON 4 0 PS 2 18 (SUTURE) IMPLANT
SUT MNCRL AB 3-0 PS2 18 (SUTURE) ×4 IMPLANT
SUT MNCRL AB 4-0 PS2 18 (SUTURE) IMPLANT
SUT MON AB 5-0 P3 18 (SUTURE) IMPLANT
SUT PDS AB 2-0 CT1 27 (SUTURE) ×3 IMPLANT
SUT PLAIN 5 0 P 3 18 (SUTURE) IMPLANT
SUT PROLENE 5 0 P 3 (SUTURE) IMPLANT
SUT PROLENE 6 0 P 1 18 (SUTURE) IMPLANT
SUT SILK 2 0 PERMA HAND 18 BK (SUTURE) ×3 IMPLANT
SUT VICRYL 4-0 PS2 18IN ABS (SUTURE) IMPLANT
SYR BULB 3OZ (MISCELLANEOUS) ×2 IMPLANT
SYR CONTROL 10ML LL (SYRINGE) IMPLANT
TAPE CLOTH SURG 6X10 WHT LF (GAUZE/BANDAGES/DRESSINGS) ×2 IMPLANT
TOWEL OR 17X24 6PK STRL BLUE (TOWEL DISPOSABLE) ×3 IMPLANT
TRAY ENT MC OR (CUSTOM PROCEDURE TRAY) ×3 IMPLANT
TUBE CONNECTING 20'X1/4 (TUBING) ×1
TUBE CONNECTING 20X1/4 (TUBING) ×1 IMPLANT

## 2018-01-24 NOTE — Anesthesia Procedure Notes (Signed)
Procedure Name: Intubation Date/Time: 01/24/2018 7:30 AM Performed by: Imagene Riches, CRNA Pre-anesthesia Checklist: Patient identified, Emergency Drugs available, Suction available and Patient being monitored Patient Re-evaluated:Patient Re-evaluated prior to induction Oxygen Delivery Method: Circle System Utilized Preoxygenation: Pre-oxygenation with 100% oxygen Induction Type: IV induction Ventilation: Mask ventilation without difficulty Laryngoscope Size: Miller and 2 Grade View: Grade II Tube type: Oral Tube size: 7.0 mm Number of attempts: 1 Airway Equipment and Method: Stylet and Oral airway Placement Confirmation: ETT inserted through vocal cords under direct vision,  positive ETCO2 and breath sounds checked- equal and bilateral Secured at: 23 cm Tube secured with: Tape Dental Injury: Teeth and Oropharynx as per pre-operative assessment

## 2018-01-24 NOTE — Op Note (Signed)
Operative Note   DATE OF OPERATION: 1.29.19  LOCATION: [facility] OR-outpatient  SURGICAL DIVISION: Plastic Surgery  PREOPERATIVE DIAGNOSES:  Squamous cell carcinoma abdomen  POSTOPERATIVE DIAGNOSES:  same  PROCEDURE:  1. Excision malignant lesion with margins 11 x 10 cm 2. Layered closure abdomen 18 cm  SURGEON: Irene Limbo MD MBA  ASSISTANT: none  ANESTHESIA:  General.   EBL: 50 ml  COMPLICATIONS: None immediate.   INDICATIONS FOR PROCEDURE:  The patient, Jeffrey Hardin, is a 84 y.o. male born on 12-17-1934, is here for excision biopsy proven SCC abdomen. This is in an area of chronic traumatization, history multiple prior wounds from his left leg prosthesis. Patient has a history of synovial cell carcinoma post disarticulation left hip.   FINDINGS: Large ulcerated hypertropic mass right lower quadrant abdomen, mobile 8 x 9 cm  DESCRIPTION OF PROCEDURE:  The patient's operative site was marked with the patient in the preoperative area. The patient was taken to the operating room. SCDs were placed, SQ heparin administered and IV antibiotics were given. The patient's operative site was prepped and draped in a sterile fashion. A time out was performed and all information was confirmed to be correct. Local anesthetic infiltrated surrounding planned resection. 2 cm margins marked surrounding mass. Sharp excision completed. The deep layer was resected including superficial fascia; grossly no deep involvement tumor and mass freely mobile over abdominal wall. Skin flap elevated in subcutaneous layer to aide with closure. Wound irrigated and hemostasis ensured. 15 Fr JP placed and secured with 2-0 nylon. Layered closure completed with 2-0 PDS in superficial fascia, 3-0 monocryl in dermis and 4-0 monocryl subcuticular closure. Dermabond, dry dressing applied.  The patient was allowed to wake from anesthesia, extubated and taken to the recovery room in satisfactory condition.   SPECIMENS:  squamous cell carcinoma abdomen (Marjolin ulcer)  DRAINS: 15 Fr JP in subcutaneous abdomen  Irene Limbo, MD [facility] Plastic & Reconstructive Surgery [phone], pin [phone]

## 2018-01-24 NOTE — Interval H&P Note (Signed)
History and Physical Interval Note:  01/24/2018 6:43 AM  Jeffrey Hardin  has presented today for surgery, with the diagnosis of SQUAMOUS CELL CARCINOMA OF ABDOMIEN  The various methods of treatment have been discussed with the patient and family. After consideration of risks, benefits and other options for treatment, the patient has consented to  Procedure(s): EXCISION MALIGNANT  LESION OF ABDOMEN WITH LAYERED CLOSURE (N/A) as a surgical intervention .  The patient's history has been reviewed, patient examined, no change in status, stable for surgery.  I have reviewed the patient's chart and labs.  Questions were answered to the patient's satisfaction.     [name]

## 2018-01-24 NOTE — Progress Notes (Signed)
Report given to jamie hart rn as caregiver 

## 2018-01-24 NOTE — Anesthesia Postprocedure Evaluation (Signed)
Anesthesia Post Note  Patient: Jeffrey Hardin  Procedure(s) Performed: EXCISION MALIGNANT  LESION OF ABDOMEN WITH LAYERED CLOSURE (N/A Abdomen)     Patient location during evaluation: PACU Anesthesia Type: General Level of consciousness: awake and alert Pain management: pain level controlled Vital Signs Assessment: post-procedure vital signs reviewed and stable Respiratory status: spontaneous breathing, nonlabored ventilation, respiratory function stable and patient connected to nasal cannula oxygen Cardiovascular status: blood pressure returned to baseline and stable Postop Assessment: no apparent nausea or vomiting Anesthetic complications: no    Last Vitals:  Vitals:   01/24/18 0915 01/24/18 0921  BP: (!) 152/65 (!) 144/68  Pulse: (!) 50 (!) 55  Resp: 15 17  Temp: 36.8 C   SpO2: 97% 98%    Last Pain:  Vitals:   01/24/18 0915  TempSrc:   PainSc: 0-No pain                 Effie Berkshire

## 2018-01-24 NOTE — Transfer of Care (Signed)
Immediate Anesthesia Transfer of Care Note  Patient: Jeffrey Hardin  Procedure(s) Performed: EXCISION MALIGNANT  LESION OF ABDOMEN WITH LAYERED CLOSURE (N/A Abdomen)  Patient Location: PACU  Anesthesia Type:General  Level of Consciousness: awake, alert  and oriented  Airway & Oxygen Therapy: Patient Spontanous Breathing and Patient connected to nasal cannula oxygen  Post-op Assessment: Report given to RN and Post -op Vital signs reviewed and stable  Post vital signs: Reviewed and stable  Last Vitals:  Vitals:   01/24/18 0640  BP: (!) 141/48  Pulse: (!) 54  Resp: 20  Temp: 36.9 C  SpO2: 99%    Last Pain:  Vitals:   01/24/18 0702  TempSrc:   PainSc: 2          Complications: No apparent anesthesia complications

## 2018-01-25 ENCOUNTER — Encounter (HOSPITAL_COMMUNITY): Payer: Self-pay | Admitting: Plastic Surgery

## 2018-02-16 DIAGNOSIS — T8131XA Disruption of external operation (surgical) wound, not elsewhere classified, initial encounter: Secondary | ICD-10-CM | POA: Diagnosis not present

## 2018-02-16 DIAGNOSIS — Z85828 Personal history of other malignant neoplasm of skin: Secondary | ICD-10-CM | POA: Diagnosis not present

## 2018-02-23 ENCOUNTER — Ambulatory Visit: Payer: TRICARE For Life (TFL)

## 2018-02-23 ENCOUNTER — Ambulatory Visit: Payer: TRICARE For Life (TFL) | Admitting: Radiation Oncology

## 2018-04-06 ENCOUNTER — Other Ambulatory Visit: Payer: Self-pay | Admitting: Family Medicine

## 2018-04-06 DIAGNOSIS — I119 Hypertensive heart disease without heart failure: Secondary | ICD-10-CM

## 2018-04-06 DIAGNOSIS — E039 Hypothyroidism, unspecified: Secondary | ICD-10-CM

## 2018-06-07 DIAGNOSIS — C475 Malignant neoplasm of peripheral nerves of pelvis: Secondary | ICD-10-CM | POA: Diagnosis not present

## 2018-06-07 DIAGNOSIS — C44529 Squamous cell carcinoma of skin of other part of trunk: Secondary | ICD-10-CM | POA: Diagnosis not present

## 2018-06-08 ENCOUNTER — Encounter: Payer: Self-pay | Admitting: Oncology

## 2018-06-08 ENCOUNTER — Telehealth: Payer: Self-pay | Admitting: Oncology

## 2018-06-08 NOTE — Telephone Encounter (Signed)
New referral received from Dr. Iran Planas for dx of metastatic squamous cell carcinoma. Spoke to [place] from Dr. Para Skeans office and provided an appt for the pt to see Dr. Alen Blew on 6/18 at 2pm. Aware that the pt should arrive 30 minutes early. Nadyne Coombes will notify the pt. Letter mailed.

## 2018-06-13 ENCOUNTER — Inpatient Hospital Stay: Payer: Medicare Other | Attending: Oncology | Admitting: Oncology

## 2018-06-13 VITALS — BP 141/61 | HR 60 | Temp 98.6°F | Resp 17 | Ht 68.0 in | Wt 152.2 lb

## 2018-06-13 DIAGNOSIS — C44529 Squamous cell carcinoma of skin of other part of trunk: Secondary | ICD-10-CM | POA: Insufficient documentation

## 2018-06-13 DIAGNOSIS — I10 Essential (primary) hypertension: Secondary | ICD-10-CM

## 2018-06-13 DIAGNOSIS — Z5112 Encounter for antineoplastic immunotherapy: Secondary | ICD-10-CM | POA: Insufficient documentation

## 2018-06-13 DIAGNOSIS — Z7189 Other specified counseling: Secondary | ICD-10-CM | POA: Insufficient documentation

## 2018-06-13 DIAGNOSIS — C44599 Other specified malignant neoplasm of skin of other part of trunk: Secondary | ICD-10-CM

## 2018-06-13 DIAGNOSIS — C44509 Unspecified malignant neoplasm of skin of other part of trunk: Secondary | ICD-10-CM

## 2018-06-13 NOTE — Progress Notes (Signed)
[  Other Dx] - Non Pathway Diagnosis    Patient Characteristics:

## 2018-06-13 NOTE — Progress Notes (Signed)
Reason for Referral: Squamous cell carcinoma of the skin.  HPI: 84 year old gentleman currently of [place] where he lived a good portion of his life.  He has history of hyperlipidemia, hypertension as well as atrial fibrillation.  He also has a history of synovial carcinoma of the left lower extremity and underwent left hip disarticulation in the 1950s.  He was also treated with radiation therapy at the time.  He continues to wear left lower extremity prosthesis and has suffered multiple abdominal wounds related to the prosthesis.  He had a recurrent abdominal wound that was biopsied in November 2018 which showed a poorly differentiated squamous cell carcinoma.  He subsequently underwent excision of a malignant lesion measuring 11 x 10 cm and layered closure completed on January 24, 2018 by Dr. Iran Planas.  The final pathology showed invasive squamous cell carcinoma with moderate differentiation measuring 8.8 cm with negative margins.  He did not receive any additional therapy.  He returns for a follow-up on June 07, 2018 with Dr.Thimmappa with right inguinal adenopathy as well as a right axilla that is fixed.  He underwent a punch biopsy of the right groin with the final results showing squamous cell carcinoma.  Clinically he has reported increase in inguinal adenopathy and swelling in his right leg.  He denies any other complaints including pain or discomfort.  He denies any weight loss or excessive fatigue.  He still able to drive and attends activities of daily living.  He does not report any headaches, blurry vision, syncope or seizures. Does not report any fevers, chills or sweats.  Does not report any cough, wheezing or hemoptysis.  Does not report any chest pain, palpitation, orthopnea or leg edema.  Does not report any nausea, vomiting or abdominal pain.  Does not report any constipation or diarrhea.  Does not report any skeletal complaints.    Does not report frequency, urgency or hematuria.   Does not report any skin rashes or lesions. Does not report any heat or cold intolerance.  Does not report any lymphadenopathy or petechiae.  Does not report any anxiety or depression.  Remaining review of systems is negative.    Past Medical History:  Diagnosis Date  . Atrial fibrillation ([place])    not on anticoagulation  . Diverticulosis   . Dyslipidemia   . ED (erectile dysfunction)   . Hepatitis B antibody positive   . Hx of colonic polyp   . Hypertension   . Hypothyroidism   . Squamous cell skin cancer   . Thyroid disease    HYPOTHYROIDISM  :  Past Surgical History:  Procedure Laterality Date  . COLONOSCOPY  2007   Dr.Stark  . MASS EXCISION N/A 01/24/2018   Procedure: EXCISION MALIGNANT  LESION OF ABDOMEN WITH LAYERED CLOSURE;  Surgeon: Irene Limbo, MD;  Location: [facility];  Service: Plastics;  Laterality: N/A;  :   Current Outpatient Medications:  .  amLODipine-benazepril (LOTREL) 10-20 MG capsule, TAKE 1 CAPSULE DAILY, Disp: 90 capsule, Rfl: 0 .  aspirin EC 81 MG tablet, Take 81 mg by mouth daily., Disp: , Rfl:  .  betamethasone dipropionate (DIPROLENE) 0.05 % ointment, USE AS DIRECTED (Patient taking differently: Apply 1 application topically daily as needed (allergic reaction / rash). USE AS DIRECTED), Disp: 45 g, Rfl: 5 .  calcium carbonate (TUMS - DOSED IN MG ELEMENTAL CALCIUM) 500 MG chewable tablet, Chew 2 tablets by mouth daily as needed for indigestion or heartburn., Disp: , Rfl:  .  Glucosamine HCl (GLUCOSAMINE PO),  Take 1 tablet by mouth daily., Disp: , Rfl:  .  levothyroxine (SYNTHROID, LEVOTHROID) 100 MCG tablet, TAKE 1 TABLET DAILY, Disp: 90 tablet, Rfl: 0 .  Multiple Vitamins-Minerals (MULTIVITAMIN WITH MINERALS) tablet, Take 1 tablet by mouth daily.  , Disp: , Rfl:  .  traMADol (ULTRAM) 50 MG tablet, Take 1 tablet (50 mg total) by mouth every 12 (twelve) hours as needed., Disp: 20 tablet, Rfl: 0 .  vitamin E 400 UNIT capsule, Take 400 Units by mouth daily.,  Disp: , Rfl:  No current facility-administered medications for this visit.   Facility-Administered Medications Ordered in Other Visits:  .  influenza  inactive virus vaccine (FLUZONE/FLUARIX) injection 0.5 mL, 0.5 mL, Intramuscular, Once, [name], MD:  No Active Allergies:  Family History  Problem Relation Age of Onset  . Cancer Sister   :  Social History   Socioeconomic History  . Marital status: Married    Spouse name: Not on file  . Number of children: Not on file  . Years of education: Not on file  . Highest education level: Not on file  Occupational History  . Not on file  Social Needs  . Financial resource strain: Not on file  . Food insecurity:    Worry: Not on file    Inability: Not on file  . Transportation needs:    Medical: Not on file    Non-medical: Not on file  Tobacco Use  . Smoking status: Never Smoker  . Smokeless tobacco: Never Used  Substance and Sexual Activity  . Alcohol use: Yes    Alcohol/week: 12.0 oz    Types: 20 Cans of beer per week  . Drug use: No  . Sexual activity: Not Currently  Lifestyle  . Physical activity:    Days per week: Not on file    Minutes per session: Not on file  . Stress: Not on file  Relationships  . Social connections:    Talks on phone: Not on file    Gets together: Not on file    Attends religious service: Not on file    Active member of club or organization: Not on file    Attends meetings of clubs or organizations: Not on file    Relationship status: Not on file  . Intimate partner violence:    Fear of current or ex partner: Not on file    Emotionally abused: Not on file    Physically abused: Not on file    Forced sexual activity: Not on file  Other Topics Concern  . Not on file  Social History Narrative  . Not on file  :  Pertinent items are noted in HPI.  Exam: Blood pressure (!) 141/61, pulse 60, temperature 98.6 F (37 C), temperature source Oral, resp. rate 17, height 5\' 8"  (1.727 m),  weight 152 lb 3.2 oz (69 kg), SpO2 99 %. ECOG 1  General appearance: alert and cooperative appeared without distress. Head: atraumatic without any abnormalities. Eyes: conjunctivae/corneas clear. PERRL.  Sclera anicteric. Throat: lips, mucosa, and tongue normal; without oral thrush or ulcers. Resp: clear to auscultation bilaterally without rhonchi, wheezes or dullness to percussion. Cardio: regular rate and rhythm, S1, S2 normal, no murmur.  Right leg edema noted. GI: soft, non-tender; bowel sounds normal; no masses,  no organomegaly Skin: Skin color, texture, turgor normal. No rashes or lesions Lymph nodes: Bulky right axillary and right inguinal adenopathy palpated. Neurologic: Grossly normal without any motor, sensory or deep tendon reflexes. Musculoskeletal: Left  lower leg prosthesis noted.  CBC    Component Value Date/Time   WBC 5.1 01/05/2017 0934   RBC 4.83 01/05/2017 0934   HGB 16.0 01/05/2017 0934   HCT 46.9 01/05/2017 0934   PLT 310 01/05/2017 0934   MCV 97.1 01/05/2017 0934   MCH 33.1 (H) 01/05/2017 0934   MCHC 34.1 01/05/2017 0934   RDW 14.0 01/05/2017 0934   LYMPHSABS 1,071 01/05/2017 0934   MONOABS 561 01/05/2017 0934   EOSABS 51 01/05/2017 0934   BASOSABS 51 01/05/2017 0934     Chemistry      Component Value Date/Time   NA 134 (L) 01/24/2018 0653   K 4.0 01/24/2018 0653   CL 102 01/24/2018 0653   CO2 24 01/24/2018 0653   BUN 6 01/24/2018 0653   CREATININE 0.63 01/24/2018 0653   CREATININE 0.63 (L) 01/05/2017 0934      Component Value Date/Time   CALCIUM 8.7 (L) 01/24/2018 0653   ALKPHOS 48 01/24/2018 0653   AST 18 01/24/2018 0653   ALT 12 (L) 01/24/2018 0653   BILITOT 0.5 01/24/2018 0653       Assessment and Plan:   84 year old man with the following:  1.  Metastatic squamous cell carcinoma arising from the skin with involvement of his right groin and right axilla.  His original diagnosis was in November 2018 after he presented with an abdominal  wound.  He underwent a surgical excision in January 2019 with negative margins.  No additional therapy received at the time.  He developed recurrent disease and June 2019 with right axillary and inguinal adenopathy.  The natural course of this disease was reviewed today with the patient and his family.  He has an incurable malignancy that has progressed with at least distant adenopathy and potentially other site of metastasis.  To complete the staging he will require CT scan of the chest abdomen and pelvis to complete his baseline work-up as well as give an idea about prognostication.  Despite the results of the scan, it is clear that he has an incurable disease and disease that could be palliated potentially.  Options of therapy would include observation and surveillance versus systemic treatment.  Systemic therapy would include chemotherapy versus immunotherapy.  His performance status is adequate and he is desiring treatment to achieve his goals which are maintaining reasonable quality of life.  He understands his disease cannot be cured but can be potentially palliated as long as the treatment is not too toxic.  After discussion today, it would be reasonable to try Libtayo as immune therapy attempt to palliate his disease.  Complication associated with this therapy include fatigue, tiredness, pruritus, skin rash as well as autoimmune related complications.  These would include pneumonitis, hepatitis, colitis among others.  The goal of therapy is palliative and attempt to maintain his quality of life and functionality as long as possible.  I anticipate starting this therapy in the near future.  2.  IV access: Peripheral veins will be used for the time being.  Port-A-Cath can be considered in the future.  3.  Antiemetics: Prescription for Compazine can be prescribed to him.  4.  Goals of care and prognosis: His performance status is excellent although his prognosis is guarded given the bulk of disease  as well as aggressive nature of squamous cell carcinoma.  He is willing to try initial therapy but transitioning to hospice may be a consideration in the future.  5.  Follow-up: Will be next week to start therapy  after completing his staging CT scans.  60  minutes was spent with the patient face-to-face today.  More than 50% of time was dedicated to patient counseling, education and coordination of his care.

## 2018-06-15 ENCOUNTER — Telehealth: Payer: Self-pay | Admitting: Oncology

## 2018-06-15 ENCOUNTER — Inpatient Hospital Stay: Payer: Medicare Other

## 2018-06-15 NOTE — Telephone Encounter (Signed)
Appointments partially scheduled.  In basket message sent to Dr Alen Blew regarding next infusion on 7/24 and no available infusion time around appointment @ 12:00 with him/ per 6/18 los

## 2018-06-16 ENCOUNTER — Other Ambulatory Visit: Payer: Self-pay | Admitting: Oncology

## 2018-06-16 ENCOUNTER — Telehealth: Payer: Self-pay | Admitting: Oncology

## 2018-06-16 ENCOUNTER — Telehealth: Payer: Self-pay

## 2018-06-16 DIAGNOSIS — C4492 Squamous cell carcinoma of skin, unspecified: Secondary | ICD-10-CM | POA: Insufficient documentation

## 2018-06-16 NOTE — Telephone Encounter (Signed)
Appointments scheduled and voicemail was left for patient explaining that he can pick up new scheduled at next visit per 6/18 los

## 2018-06-16 NOTE — Telephone Encounter (Signed)
Unable to move labs to 7:30 - per 6/21 sch msg.

## 2018-06-20 ENCOUNTER — Ambulatory Visit (HOSPITAL_COMMUNITY)
Admission: RE | Admit: 2018-06-20 | Discharge: 2018-06-20 | Disposition: A | Discharge status: Home / Self Care | Payer: Medicare Other | Source: Ambulatory Visit | Attending: Oncology | Admitting: Oncology

## 2018-06-20 ENCOUNTER — Encounter (HOSPITAL_COMMUNITY): Payer: Self-pay

## 2018-06-20 ENCOUNTER — Inpatient Hospital Stay: Payer: Medicare Other

## 2018-06-20 DIAGNOSIS — Z5112 Encounter for antineoplastic immunotherapy: Secondary | ICD-10-CM | POA: Diagnosis not present

## 2018-06-20 DIAGNOSIS — I2510 Atherosclerotic heart disease of native coronary artery without angina pectoris: Secondary | ICD-10-CM | POA: Diagnosis not present

## 2018-06-20 DIAGNOSIS — R918 Other nonspecific abnormal finding of lung field: Secondary | ICD-10-CM | POA: Diagnosis not present

## 2018-06-20 DIAGNOSIS — C44599 Other specified malignant neoplasm of skin of other part of trunk: Secondary | ICD-10-CM

## 2018-06-20 DIAGNOSIS — C4490 Unspecified malignant neoplasm of skin, unspecified: Secondary | ICD-10-CM | POA: Diagnosis not present

## 2018-06-20 DIAGNOSIS — C773 Secondary and unspecified malignant neoplasm of axilla and upper limb lymph nodes: Secondary | ICD-10-CM | POA: Diagnosis not present

## 2018-06-20 DIAGNOSIS — C44529 Squamous cell carcinoma of skin of other part of trunk: Secondary | ICD-10-CM | POA: Diagnosis not present

## 2018-06-20 DIAGNOSIS — C7989 Secondary malignant neoplasm of other specified sites: Secondary | ICD-10-CM | POA: Diagnosis not present

## 2018-06-20 DIAGNOSIS — C44509 Unspecified malignant neoplasm of skin of other part of trunk: Secondary | ICD-10-CM

## 2018-06-20 DIAGNOSIS — M899 Disorder of bone, unspecified: Secondary | ICD-10-CM | POA: Insufficient documentation

## 2018-06-20 DIAGNOSIS — I700 Atherosclerosis of aorta: Secondary | ICD-10-CM | POA: Insufficient documentation

## 2018-06-20 DIAGNOSIS — R590 Localized enlarged lymph nodes: Secondary | ICD-10-CM | POA: Diagnosis not present

## 2018-06-20 HISTORY — DX: Malignant neoplasm of connective and soft tissue, unspecified: C49.9

## 2018-06-20 LAB — CMP (CANCER CENTER ONLY)
ALT: <6 U/L (ref 0–44)
AST: 16 U/L (ref 15–41)
Albumin: 3 g/dL — ABNORMAL LOW (ref 3.5–5.0)
Alkaline Phosphatase: 55 U/L (ref 38–126)
Anion gap: 8 (ref 5–15)
BILIRUBIN TOTAL: 0.4 mg/dL (ref 0.3–1.2)
BUN: 7 mg/dL — ABNORMAL LOW (ref 8–23)
CO2: 25 mmol/L (ref 22–32)
Calcium: 9.2 mg/dL (ref 8.9–10.3)
Chloride: 101 mmol/L (ref 98–111)
Creatinine: 0.65 mg/dL (ref 0.61–1.24)
GFR, Est AFR Am: >60 mL/min (ref >60)
Glucose, Bld: 112 mg/dL — ABNORMAL HIGH (ref 70–99)
Potassium: 4.4 mmol/L (ref 3.5–5.1)
Sodium: 134 mmol/L — ABNORMAL LOW (ref 135–145)
TOTAL PROTEIN: 6.8 g/dL (ref 6.5–8.1)

## 2018-06-20 LAB — CBC WITH DIFFERENTIAL (CANCER CENTER ONLY)
BASOS ABS: 0 10*3/uL (ref 0.0–0.1)
Basophils Relative: 1 %
EOS ABS: 0.1 10*3/uL (ref 0.0–0.5)
EOS PCT: 2 %
HCT: 38.8 % (ref 38.4–49.9)
Hemoglobin: 13 g/dL (ref 13.0–17.1)
LYMPHS PCT: 8 %
Lymphs Abs: 0.6 10*3/uL — ABNORMAL LOW (ref 0.9–3.3)
MCH: 30.7 pg (ref 27.2–33.4)
MCHC: 33.6 g/dL (ref 32.0–36.0)
MCV: 91.4 fL (ref 79.3–98.0)
Monocytes Absolute: 0.8 10*3/uL (ref 0.1–0.9)
Monocytes Relative: 10 %
Neutro Abs: 6.4 10*3/uL (ref 1.5–6.5)
Neutrophils Relative %: 79 %
PLATELETS: 330 10*3/uL (ref 140–400)
RBC: 4.25 MIL/uL (ref 4.20–5.82)
RDW: 14.2 % (ref 11.0–14.6)
WBC: 8 10*3/uL (ref 4.0–10.3)

## 2018-06-20 MED ORDER — IOPAMIDOL (ISOVUE-300) INJECTION 61%
INTRAVENOUS | Status: AC
  Filled 2018-06-20: qty 100

## 2018-06-20 MED ORDER — IOPAMIDOL (ISOVUE-300) INJECTION 61%
100.0000 mL | Freq: Once | INTRAVENOUS | Status: AC | PRN
  Administered 2018-06-20: 100 mL via INTRAVENOUS

## 2018-06-21 ENCOUNTER — Other Ambulatory Visit: Payer: Self-pay | Admitting: Oncology

## 2018-06-21 NOTE — Progress Notes (Signed)
Results of the CT scan were reviewed with the patient and his wife over the phone.  No evidence of metastatic disease noted beyond his lymphadenopathy.  He is ready to proceed with systemic therapy as planned on 06/23/2018.

## 2018-06-23 ENCOUNTER — Inpatient Hospital Stay: Payer: Medicare Other

## 2018-06-23 VITALS — BP 139/63 | HR 49 | Temp 98.7°F | Resp 18

## 2018-06-23 DIAGNOSIS — C44529 Squamous cell carcinoma of skin of other part of trunk: Secondary | ICD-10-CM | POA: Diagnosis not present

## 2018-06-23 DIAGNOSIS — C4492 Squamous cell carcinoma of skin, unspecified: Secondary | ICD-10-CM

## 2018-06-23 DIAGNOSIS — C44509 Unspecified malignant neoplasm of skin of other part of trunk: Secondary | ICD-10-CM

## 2018-06-23 DIAGNOSIS — C44599 Other specified malignant neoplasm of skin of other part of trunk: Secondary | ICD-10-CM

## 2018-06-23 DIAGNOSIS — Z5112 Encounter for antineoplastic immunotherapy: Secondary | ICD-10-CM | POA: Diagnosis not present

## 2018-06-23 LAB — COMPREHENSIVE METABOLIC PANEL
ALT: 11 U/L (ref 0–44)
ANION GAP: 6 (ref 5–15)
AST: 17 U/L (ref 15–41)
Albumin: 3 g/dL — ABNORMAL LOW (ref 3.5–5.0)
Alkaline Phosphatase: 55 U/L (ref 38–126)
BILIRUBIN TOTAL: 0.4 mg/dL (ref 0.3–1.2)
BUN: 8 mg/dL (ref 8–23)
CO2: 25 mmol/L (ref 22–32)
Calcium: 8.8 mg/dL — ABNORMAL LOW (ref 8.9–10.3)
Chloride: 101 mmol/L (ref 98–111)
Creatinine, Ser: 0.49 mg/dL — ABNORMAL LOW (ref 0.61–1.24)
GFR calc Af Amer: >60 mL/min (ref >60)
Glucose, Bld: 95 mg/dL (ref 70–99)
Potassium: 4 mmol/L (ref 3.5–5.1)
Sodium: 132 mmol/L — ABNORMAL LOW (ref 135–145)
TOTAL PROTEIN: 6.7 g/dL (ref 6.5–8.1)

## 2018-06-23 LAB — CBC WITH DIFFERENTIAL (CANCER CENTER ONLY)
Basophils Absolute: 0.1 10*3/uL (ref 0.0–0.1)
Basophils Relative: 1 %
EOS ABS: 0.2 10*3/uL (ref 0.0–0.5)
Eosinophils Relative: 2 %
HEMATOCRIT: 39.1 % (ref 38.4–49.9)
Hemoglobin: 13.3 g/dL (ref 13.0–17.1)
LYMPHS ABS: 0.8 10*3/uL — AB (ref 0.9–3.3)
Lymphocytes Relative: 7 %
MCH: 30.7 pg (ref 27.2–33.4)
MCHC: 34 g/dL (ref 32.0–36.0)
MCV: 90.2 fL (ref 79.3–98.0)
MONOS PCT: 10 %
Monocytes Absolute: 1 10*3/uL — ABNORMAL HIGH (ref 0.1–0.9)
Neutro Abs: 8.6 10*3/uL — ABNORMAL HIGH (ref 1.5–6.5)
Neutrophils Relative %: 80 %
Platelet Count: 335 10*3/uL (ref 140–400)
RBC: 4.33 MIL/uL (ref 4.20–5.82)
RDW: 14.2 % (ref 11.0–14.6)
WBC: 10.6 10*3/uL — AB (ref 4.0–10.3)

## 2018-06-23 MED ORDER — SODIUM CHLORIDE 0.9 % IV SOLN
Freq: Once | INTRAVENOUS | Status: AC
  Administered 2018-06-23: 15:00:00 via INTRAVENOUS

## 2018-06-23 MED ORDER — SODIUM CHLORIDE 0.9 % IV SOLN
350.0000 mg | Freq: Once | INTRAVENOUS | Status: AC
  Administered 2018-06-23: 350 mg via INTRAVENOUS
  Filled 2018-06-23: qty 7

## 2018-06-23 NOTE — Patient Instructions (Signed)
[facility] Discharge Instructions for Patients Receiving Chemotherapy  Today you received the following chemotherapy agents Libtayo  To help prevent nausea and vomiting after your treatment, we encourage you to take your nausea medication as directed.    If you develop nausea and vomiting that is not controlled by your nausea medication, call the clinic.   BELOW ARE SYMPTOMS THAT SHOULD BE REPORTED IMMEDIATELY:  *FEVER GREATER THAN 100.5 F  *CHILLS WITH OR WITHOUT FEVER  NAUSEA AND VOMITING THAT IS NOT CONTROLLED WITH YOUR NAUSEA MEDICATION  *UNUSUAL SHORTNESS OF BREATH  *UNUSUAL BRUISING OR BLEEDING  TENDERNESS IN MOUTH AND THROAT WITH OR WITHOUT PRESENCE OF ULCERS  *URINARY PROBLEMS  *BOWEL PROBLEMS  UNUSUAL RASH Items with * indicate a potential emergency and should be followed up as soon as possible.  Feel free to call the clinic should you have any questions or concerns. The clinic phone number is [phone].  Please show the CHEMO ALERT CARD at check-in to the Emergency Department and triage nurse.   

## 2018-06-26 ENCOUNTER — Telehealth: Payer: Self-pay | Admitting: Oncology

## 2018-06-26 NOTE — Telephone Encounter (Signed)
Appointments rescheduled per 7/1 phone call from patient and In basket message sent to Dr Alen Blew about moving appointments and time for follow up appointments

## 2018-07-19 ENCOUNTER — Ambulatory Visit: Payer: Medicare Other

## 2018-07-19 ENCOUNTER — Other Ambulatory Visit: Payer: Medicare Other

## 2018-07-19 ENCOUNTER — Ambulatory Visit: Payer: Medicare Other | Admitting: Oncology

## 2018-07-24 ENCOUNTER — Other Ambulatory Visit: Payer: Medicare Other

## 2018-07-24 ENCOUNTER — Ambulatory Visit: Payer: Medicare Other

## 2018-07-25 ENCOUNTER — Telehealth: Payer: Self-pay | Admitting: Family Medicine

## 2018-07-25 MED ORDER — BETAMETHASONE DIPROPIONATE 0.05 % EX OINT: TOPICAL_OINTMENT | CUTANEOUS | 5 refills | Status: DC

## 2018-07-25 NOTE — Telephone Encounter (Signed)
[facility] Pt called for refills of Diprolene Ointment please send to [facility] on [facility]. They can be reached at [phone].

## 2018-07-26 ENCOUNTER — Inpatient Hospital Stay: Payer: Medicare Other

## 2018-07-26 ENCOUNTER — Inpatient Hospital Stay: Payer: Medicare Other | Attending: Oncology

## 2018-07-26 ENCOUNTER — Inpatient Hospital Stay (HOSPITAL_BASED_OUTPATIENT_CLINIC_OR_DEPARTMENT_OTHER): Payer: Medicare Other | Admitting: Oncology

## 2018-07-26 ENCOUNTER — Telehealth: Payer: Self-pay | Admitting: Oncology

## 2018-07-26 VITALS — BP 174/66 | HR 48 | Temp 98.8°F | Ht 68.0 in | Wt 149.7 lb

## 2018-07-26 DIAGNOSIS — Z5112 Encounter for antineoplastic immunotherapy: Secondary | ICD-10-CM | POA: Insufficient documentation

## 2018-07-26 DIAGNOSIS — C44599 Other specified malignant neoplasm of skin of other part of trunk: Secondary | ICD-10-CM

## 2018-07-26 DIAGNOSIS — C4492 Squamous cell carcinoma of skin, unspecified: Secondary | ICD-10-CM

## 2018-07-26 DIAGNOSIS — C44509 Unspecified malignant neoplasm of skin of other part of trunk: Secondary | ICD-10-CM

## 2018-07-26 DIAGNOSIS — C778 Secondary and unspecified malignant neoplasm of lymph nodes of multiple regions: Secondary | ICD-10-CM

## 2018-07-26 LAB — CBC WITH DIFFERENTIAL (CANCER CENTER ONLY)
BASOS ABS: 0 10*3/uL (ref 0.0–0.1)
Basophils Relative: 1 %
Eosinophils Absolute: 0.1 10*3/uL (ref 0.0–0.5)
Eosinophils Relative: 3 %
HEMATOCRIT: 39.7 % (ref 38.4–49.9)
Hemoglobin: 13.2 g/dL (ref 13.0–17.1)
LYMPHS PCT: 19 %
Lymphs Abs: 0.8 10*3/uL — ABNORMAL LOW (ref 0.9–3.3)
MCH: 30.9 pg (ref 27.2–33.4)
MCHC: 33.4 g/dL (ref 32.0–36.0)
MCV: 92.5 fL (ref 79.3–98.0)
MONO ABS: 0.5 10*3/uL (ref 0.1–0.9)
Monocytes Relative: 13 %
NEUTROS ABS: 2.7 10*3/uL (ref 1.5–6.5)
Neutrophils Relative %: 64 %
Platelet Count: 259 10*3/uL (ref 140–400)
RBC: 4.29 MIL/uL (ref 4.20–5.82)
RDW: 15.9 % — AB (ref 11.0–14.6)
WBC Count: 4.2 10*3/uL (ref 4.0–10.3)

## 2018-07-26 LAB — CMP (CANCER CENTER ONLY)
ALBUMIN: 3.3 g/dL — AB (ref 3.5–5.0)
ALT: 14 U/L (ref 0–44)
ANION GAP: 7 (ref 5–15)
AST: 20 U/L (ref 15–41)
Alkaline Phosphatase: 60 U/L (ref 38–126)
BILIRUBIN TOTAL: 0.6 mg/dL (ref 0.3–1.2)
BUN: 9 mg/dL (ref 8–23)
CO2: 25 mmol/L (ref 22–32)
Calcium: 8.7 mg/dL — ABNORMAL LOW (ref 8.9–10.3)
Chloride: 105 mmol/L (ref 98–111)
Creatinine: 0.66 mg/dL (ref 0.61–1.24)
GFR, Est AFR Am: >60 mL/min (ref >60)
GFR, Estimated: >60 mL/min (ref >60)
GLUCOSE: 104 mg/dL — AB (ref 70–99)
POTASSIUM: 4.1 mmol/L (ref 3.5–5.1)
SODIUM: 137 mmol/L (ref 135–145)
TOTAL PROTEIN: 6.9 g/dL (ref 6.5–8.1)

## 2018-07-26 MED ORDER — SODIUM CHLORIDE 0.9% FLUSH
3.0000 mL | INTRAVENOUS | Status: DC | PRN
  Filled 2018-07-26: qty 10

## 2018-07-26 MED ORDER — SODIUM CHLORIDE 0.9 % IV SOLN
350.0000 mg | Freq: Once | INTRAVENOUS | Status: AC
  Administered 2018-07-26: 350 mg via INTRAVENOUS
  Filled 2018-07-26: qty 7

## 2018-07-26 MED ORDER — SODIUM CHLORIDE 0.9 % IV SOLN
Freq: Once | INTRAVENOUS | Status: AC
  Administered 2018-07-26: 11:00:00 via INTRAVENOUS
  Filled 2018-07-26: qty 250

## 2018-07-26 NOTE — Patient Instructions (Signed)
[facility] Discharge Instructions for Patients Receiving Chemotherapy  Today you received the following chemotherapy agents: Cemiplimab-rwlc (Libtayo)  To help prevent nausea and vomiting after your treatment, we encourage you to take your nausea medication as prescribed.    If you develop nausea and vomiting that is not controlled by your nausea medication, call the clinic.   BELOW ARE SYMPTOMS THAT SHOULD BE REPORTED IMMEDIATELY:  *FEVER GREATER THAN 100.5 F  *CHILLS WITH OR WITHOUT FEVER  NAUSEA AND VOMITING THAT IS NOT CONTROLLED WITH YOUR NAUSEA MEDICATION  *UNUSUAL SHORTNESS OF BREATH  *UNUSUAL BRUISING OR BLEEDING  TENDERNESS IN MOUTH AND THROAT WITH OR WITHOUT PRESENCE OF ULCERS  *URINARY PROBLEMS  *BOWEL PROBLEMS  UNUSUAL RASH Items with * indicate a potential emergency and should be followed up as soon as possible.  Feel free to call the clinic should you have any questions or concerns. The clinic phone number is [phone].  Please show the [facility] at check-in to the Emergency Department and triage nurse.

## 2018-07-26 NOTE — Telephone Encounter (Signed)
Appointments scheduled AVS/Calendar printed per 7/31 los °

## 2018-07-26 NOTE — Progress Notes (Signed)
Hematology and Oncology Follow Up Visit  Jeffrey Hardin [id number] 1934/08/06 84 y.o. 07/26/2018 9:54 AM [name], MDLalonde, [name], MD   Principle Diagnosis: 84 year old man with a squamous cell carcinoma of the skin diagnosed in November 2018.  He developed stage IV disease with recurrent disease right axilla as well as inguinal adenopathy.   Prior Therapy: He underwent excision of a malignant lesion measuring 11 x 10 cm and layered closure completed on January 24, 2018 by Dr. Iran Planas.  The final pathology showed invasive squamous cell carcinoma with moderate differentiation measuring 8.8 cm with negative margins.  He did not receive any additional therapy.     Current therapy: Libtayo 350 mg started on 06/23/2018.   Interim History: Jeffrey Hardin presents today for a follow-up visit.  Since the last visit, he received the first treatment with immunotherapy without any complications.  He denies any infusion related complications, fatigue or tiredness.  He has also noticed improvement in his axillary and inguinal adenopathy.  He denies any pruritus or skin rash.  His appetite and performance status remains excellent.  He was able to spend 3 weeks at the beach without any issues.  He does not report any headaches, blurry vision, syncope or seizures. Does not report any fevers, chills or sweats.  Does not report any cough, wheezing or hemoptysis.  Does not report any chest pain, palpitation, orthopnea or leg edema.  Does not report any nausea, vomiting or abdominal pain.  Does not report any constipation or diarrhea.  Does not report any skeletal complaints.    Does not report frequency, urgency or hematuria.  Does not report any skin rashes or lesions. Does not report any heat or cold intolerance.  Does not report any lymphadenopathy or petechiae.  Does not report any anxiety or depression.  Remaining review of systems is negative.    Medications: I have reviewed the patient's current  medications.  Current Outpatient Medications  Medication Sig Dispense Refill  . amLODipine-benazepril (LOTREL) 10-20 MG capsule TAKE 1 CAPSULE DAILY 90 capsule 0  . aspirin EC 81 MG tablet Take 81 mg by mouth daily.    . betamethasone dipropionate (DIPROLENE) 0.05 % ointment USE AS DIRECTED 45 g 5  . calcium carbonate (TUMS - DOSED IN MG ELEMENTAL CALCIUM) 500 MG chewable tablet Chew 2 tablets by mouth daily as needed for indigestion or heartburn.    . Glucosamine HCl (GLUCOSAMINE PO) Take 1 tablet by mouth daily.    Marland Kitchen levothyroxine (SYNTHROID, LEVOTHROID) 100 MCG tablet TAKE 1 TABLET DAILY 90 tablet 0  . Multiple Vitamins-Minerals (MULTIVITAMIN WITH MINERALS) tablet Take 1 tablet by mouth daily.      . traMADol (ULTRAM) 50 MG tablet Take 1 tablet (50 mg total) by mouth every 12 (twelve) hours as needed. 20 tablet 0  . vitamin E 400 UNIT capsule Take 400 Units by mouth daily.     No current facility-administered medications for this visit.    Facility-Administered Medications Ordered in Other Visits  Medication Dose Route Frequency Provider Last Rate Last Dose  . influenza  inactive virus vaccine (FLUZONE/FLUARIX) injection 0.5 mL  0.5 mL Intramuscular Once [name], MD         Allergies: No Known Allergies  Past Medical History, Surgical history, Social history, and Family History were reviewed and updated.    Physical Exam: Blood pressure (!) 174/66, pulse (!) 48, temperature 98.8 F (37.1 C), temperature source Oral, height 5\' 8"  (1.727 m), weight 149 lb  11.2 oz (67.9 kg), head circumference 17" (43.2 cm), SpO2 98 %.   ECOG: 1 General appearance: alert and cooperative appeared without distress. Head: Normocephalic, without obvious abnormality Oropharynx: No oral thrush or ulcers. Eyes: No scleral icterus.  Pupils are equal and round reactive to light. Lymph nodes: Significant decrease in his axillary lymphadenopathy palpated.  He still has palpable right axillary  node. Heart:regular rate and rhythm, S1, S2 normal, no murmur, click, rub or gallop Lung:chest clear, no wheezing, rales, normal symmetric air entry Abdomin: soft, non-tender, without masses or organomegaly. Neurological: No motor, sensory deficits.  Intact deep tendon reflexes. Skin: No rashes or lesions.  No ecchymosis or petechiae. Musculoskeletal: No joint deformity or effusion. Psychiatric: Mood and affect are appropriate.    Lab Results: Lab Results  Component Value Date   WBC 10.6 (H) 06/23/2018   HGB 13.3 06/23/2018   HCT 39.1 06/23/2018   MCV 90.2 06/23/2018   PLT 335 06/23/2018     Chemistry      Component Value Date/Time   NA 132 (L) 06/23/2018 1345   K 4.0 06/23/2018 1345   CL 101 06/23/2018 1345   CO2 25 06/23/2018 1345   BUN 8 06/23/2018 1345   CREATININE 0.49 (L) 06/23/2018 1345   CREATININE 0.65 06/20/2018 0912   CREATININE 0.63 (L) 01/05/2017 0934      Component Value Date/Time   CALCIUM 8.8 (L) 06/23/2018 1345   ALKPHOS 55 06/23/2018 1345   AST 17 06/23/2018 1345   AST 16 06/20/2018 0912   ALT 11 06/23/2018 1345   ALT <6 06/20/2018 0912   BILITOT 0.4 06/23/2018 1345   BILITOT 0.4 06/20/2018 0912       Radiological Studies:  EXAM: CT CHEST, ABDOMEN, AND PELVIS WITH CONTRAST  TECHNIQUE: Multidetector CT imaging of the chest, abdomen and pelvis was performed following the standard protocol during bolus administration of intravenous contrast.  CONTRAST:  128mL ISOVUE-300 IOPAMIDOL (ISOVUE-300) INJECTION 61%  COMPARISON:  None.  FINDINGS: CT CHEST FINDINGS  Cardiovascular: Atherosclerotic calcification of the arterial vasculature, including coronary arteries and aortic valve. Heart size normal. No pericardial effusion.  Mediastinum/Nodes: Necrotic right axillary lymph nodes measure up to 5.6 x 5.7 cm. No pathologically enlarged mediastinal, hilar or left axillary lymph nodes. Esophagus is grossly unremarkable.  Lungs/Pleura:  Probable mild basilar subpleural scarring. A few scattered nodular densities measure up to 6 mm in the right lower lobe (series 6, image 117). Lungs are otherwise clear. No pleural fluid. Airway is unremarkable.  Musculoskeletal: Old rib fractures. Degenerative changes in the spine. A small lucent lesion is seen in the posterolateral right sixth rib (series 6, image 58).  CT ABDOMEN PELVIS FINDINGS  Hepatobiliary: Liver and gallbladder are unremarkable. No biliary ductal dilatation.  Pancreas: Negative.  Spleen: Negative.  Adrenals/Urinary Tract: Adrenal glands and right kidney are unremarkable. Subcentimeter low-attenuation lesion in the interpolar right kidney is too small to characterize. Exophytic low-attenuation lesion off the upper pole left kidney measures 4.5 cm and is likely a cyst. Kidneys are otherwise unremarkable. Ureters are decompressed. Bladder is grossly unremarkable.  Stomach/Bowel: Stomach, small bowel, appendix and colon are unremarkable.  Vascular/Lymphatic: Atherosclerotic calcification of the arterial vasculature without abdominal aortic aneurysm. Adenopathy is seen along the right external iliac chain and right obturator station. Index external right iliac necrotic appearing lymph node measures 2.8 x 3.0 cm. Right inguinal adenopathy measures up to 3.2 x 7.2 cm.  Reproductive: Prostate is minimally prominent.  Other: No free fluid. Mesenteries and peritoneum are  otherwise unremarkable.  Musculoskeletal: Left lower extremity amputation. Degenerative changes in the spine.  IMPRESSION: 1. Necrotic metastatic adenopathy in the right axilla, along the right external iliac chain and right groin. 2. Scattered small pulmonary nodular densities are nonspecific. Continued attention on follow-up exams is warranted. 3. Lucent lesion in the right sixth rib is nonspecific. Suggest attention on follow-up. 4. Aortic atherosclerosis (ICD10-170.0).  Coronary artery calcification.  Impression and Plan:  84 year old man with:  1.  Squamous cell carcinoma of the of the skin with metastatic disease to the axilla and the right external iliac chain.  This was diagnosed in June 2019.  He is currently receiving immunotherapy in the form of Libtayo that has been well-tolerated.  He is noticing significant clinical response with improvement in his inguinal as well as axillary adenopathy.  Risks and benefits of continuing this therapy long-term was reviewed today is agreeable to continue.  Imaging studies of the chest abdomen and pelvis obtained in June 2019 were personally reviewed and discussed with the patient.  The plan is to continue with this treatment every 3 to 4 weeks and repeat imaging studies in 3 months.  2.  IV access: He has declined Port-A-Cath at this time.  Peripheral veins will be continued to use at this time.  3.  Antiemetics: No nausea or vomiting reported at this time.  4.  Goals of care and prognosis: Treatment is palliative but is experiencing excellent response and aggressive therapy is warranted.  5.  Follow-up: Will be in 3 weeks for the next cycle of therapy.    25  minutes was spent with the patient face-to-face today.  More than 50% of time was dedicated to reviewing imaging studies, the natural course of this disease and future treatment plans.       [name], MD 7/31/20199:54 AM

## 2018-07-27 ENCOUNTER — Other Ambulatory Visit: Payer: Self-pay | Admitting: Family Medicine

## 2018-07-27 DIAGNOSIS — E039 Hypothyroidism, unspecified: Secondary | ICD-10-CM

## 2018-07-27 DIAGNOSIS — I119 Hypertensive heart disease without heart failure: Secondary | ICD-10-CM

## 2018-08-09 ENCOUNTER — Ambulatory Visit: Payer: Medicare Other

## 2018-08-09 ENCOUNTER — Other Ambulatory Visit: Payer: Medicare Other

## 2018-08-09 ENCOUNTER — Ambulatory Visit: Payer: Medicare Other | Admitting: Oncology

## 2018-08-15 ENCOUNTER — Inpatient Hospital Stay: Payer: Medicare Other | Attending: Oncology

## 2018-08-15 ENCOUNTER — Inpatient Hospital Stay (HOSPITAL_BASED_OUTPATIENT_CLINIC_OR_DEPARTMENT_OTHER): Payer: Medicare Other | Admitting: Oncology

## 2018-08-15 ENCOUNTER — Inpatient Hospital Stay: Payer: Medicare Other

## 2018-08-15 ENCOUNTER — Telehealth: Payer: Self-pay

## 2018-08-15 VITALS — BP 163/65 | HR 60 | Temp 98.6°F | Resp 18 | Ht 68.0 in | Wt 153.3 lb

## 2018-08-15 DIAGNOSIS — C4492 Squamous cell carcinoma of skin, unspecified: Secondary | ICD-10-CM | POA: Insufficient documentation

## 2018-08-15 DIAGNOSIS — C778 Secondary and unspecified malignant neoplasm of lymph nodes of multiple regions: Secondary | ICD-10-CM | POA: Diagnosis not present

## 2018-08-15 DIAGNOSIS — Z5112 Encounter for antineoplastic immunotherapy: Secondary | ICD-10-CM | POA: Diagnosis not present

## 2018-08-15 DIAGNOSIS — C44599 Other specified malignant neoplasm of skin of other part of trunk: Secondary | ICD-10-CM

## 2018-08-15 DIAGNOSIS — C44509 Unspecified malignant neoplasm of skin of other part of trunk: Secondary | ICD-10-CM

## 2018-08-15 LAB — CBC WITH DIFFERENTIAL (CANCER CENTER ONLY)
BASOS PCT: 1 %
Basophils Absolute: 0.1 10*3/uL (ref 0.0–0.1)
EOS ABS: 0.2 10*3/uL (ref 0.0–0.5)
EOS PCT: 4 %
HCT: 43.2 % (ref 38.4–49.9)
HEMOGLOBIN: 14.3 g/dL (ref 13.0–17.1)
Lymphocytes Relative: 18 %
Lymphs Abs: 0.8 10*3/uL — ABNORMAL LOW (ref 0.9–3.3)
MCH: 31.3 pg (ref 27.2–33.4)
MCHC: 33.1 g/dL (ref 32.0–36.0)
MCV: 94.6 fL (ref 79.3–98.0)
MONOS PCT: 11 %
Monocytes Absolute: 0.5 10*3/uL (ref 0.1–0.9)
NEUTROS PCT: 66 %
Neutro Abs: 2.9 10*3/uL (ref 1.5–6.5)
PLATELETS: 294 10*3/uL (ref 140–400)
RBC: 4.57 MIL/uL (ref 4.20–5.82)
RDW: 15.7 % — ABNORMAL HIGH (ref 11.0–14.6)
WBC: 4.4 10*3/uL (ref 4.0–10.3)

## 2018-08-15 LAB — CMP (CANCER CENTER ONLY)
ALBUMIN: 3.7 g/dL (ref 3.5–5.0)
ALK PHOS: 65 U/L (ref 38–126)
ALT: 16 U/L (ref 0–44)
ANION GAP: 9 (ref 5–15)
AST: 22 U/L (ref 15–41)
BUN: 11 mg/dL (ref 8–23)
CHLORIDE: 105 mmol/L (ref 98–111)
CO2: 25 mmol/L (ref 22–32)
Calcium: 9 mg/dL (ref 8.9–10.3)
Creatinine: 0.73 mg/dL (ref 0.61–1.24)
GFR, Estimated: >60 mL/min (ref >60)
GLUCOSE: 101 mg/dL — AB (ref 70–99)
POTASSIUM: 4.1 mmol/L (ref 3.5–5.1)
SODIUM: 139 mmol/L (ref 135–145)
Total Bilirubin: 0.8 mg/dL (ref 0.3–1.2)
Total Protein: 7.6 g/dL (ref 6.5–8.1)

## 2018-08-15 MED ORDER — SODIUM CHLORIDE 0.9 % IV SOLN
Freq: Once | INTRAVENOUS | Status: AC
  Administered 2018-08-15: 11:00:00 via INTRAVENOUS
  Filled 2018-08-15: qty 250

## 2018-08-15 MED ORDER — SODIUM CHLORIDE 0.9 % IV SOLN
350.0000 mg | Freq: Once | INTRAVENOUS | Status: AC
  Administered 2018-08-15: 350 mg via INTRAVENOUS
  Filled 2018-08-15: qty 7

## 2018-08-15 NOTE — Progress Notes (Signed)
Hematology and Oncology Follow Up Visit  Jeffrey Hardin [id number] February 05, 1934 84 y.o. 08/15/2018 9:29 AM Jeffrey Hardin, MDLalonde, Jeffrey Jarvis, MD   Principle Diagnosis: 84 year old man with a stage IV squamous cell carcinoma of the skin with axillary and inguinal adenopathy diagnosed in November 2018.     Prior Therapy: He underwent excision of a malignant lesion measuring 11 x 10 cm and layered closure completed on January 24, 2018 by Jeffrey Hardin.  The final pathology showed invasive squamous cell carcinoma with moderate differentiation measuring 8.8 cm with negative margins.  He did not receive any additional therapy.     Current therapy: Libtayo 350 mg started on 06/23/2018.  He is here for cycle 3 of therapy.   Interim History: Jeffrey Hardin is here for a follow-up visit.  Since the last visit, he continues to do very well without any recent complaints.  He has tolerated immunotherapy without any complications.  He denies any excessive fatigue or tiredness.  He denies any worsening pruritus.  He remains active and attends to activities of daily living.  He has noted subjective decline in his axillary masses.  His appetite and performance status remains excellent.  He does not report any headaches, blurry vision, syncope or seizures.  He denies any alteration mental status or confusion.  Does not report any fevers, chills or sweats.  Does not report any cough, wheezing or hemoptysis.  Does not report any chest pain, palpitation, orthopnea or leg edema.  Does not report any nausea, vomiting or abdominal pain.  Does not report any changes in bowel habits. Does not report any arthralgias or myalgias   Does not report frequency, urgency or hematuria.  Does not report any skin rashes or lesions.e.  Does not report any lymphadenopathy or petechiae.  Does not report any mood changes.  Remaining review of systems is negative.    Medications: I have reviewed the patient's current medications.  Current  Outpatient Medications  Medication Sig Dispense Refill  . amLODipine-benazepril (LOTREL) 10-20 MG capsule TAKE 1 CAPSULE DAILY 90 capsule 0  . aspirin EC 81 MG tablet Take 81 mg by mouth daily.    . betamethasone dipropionate (DIPROLENE) 0.05 % ointment USE AS DIRECTED 45 g 5  . calcium carbonate (TUMS - DOSED IN MG ELEMENTAL CALCIUM) 500 MG chewable tablet Chew 2 tablets by mouth daily as needed for indigestion or heartburn.    . Glucosamine HCl (GLUCOSAMINE PO) Take 1 tablet by mouth daily.    Marland Kitchen levothyroxine (SYNTHROID, LEVOTHROID) 100 MCG tablet TAKE 1 TABLET DAILY 90 tablet 4  . Multiple Vitamins-Minerals (MULTIVITAMIN WITH MINERALS) tablet Take 1 tablet by mouth daily.      . traMADol (ULTRAM) 50 MG tablet Take 1 tablet (50 mg total) by mouth every 12 (twelve) hours as needed. 20 tablet 0  . vitamin E 400 UNIT capsule Take 400 Units by mouth daily.     No current facility-administered medications for this visit.    Facility-Administered Medications Ordered in Other Visits  Medication Dose Route Frequency Provider Last Rate Last Dose  . influenza  inactive virus vaccine (FLUZONE/FLUARIX) injection 0.5 mL  0.5 mL Intramuscular Once Jeffrey Lung, MD         Allergies: No Known Allergies  Past Medical History, Surgical history, Social history, and Family History remain unchanged on review.    Physical Exam: Blood pressure (!) 163/65, pulse 60, temperature 98.6 F (37 C), temperature source Oral, resp. rate 18, height 5\' 8"  (1.727  m), weight 153 lb 4.8 oz (69.5 kg), SpO2 97 %.    ECOG: 1 General appearance: Well-appearing gentleman without distress. Head: Atraumatic without abnormalities. Oropharynx: Mucous membranes are moist and pink. Eyes: Pupils are equal and round reactive to light. Lymph nodes: Axillary adenopathy palpated to a lesser degree.  No enlargement lymphadenopathy in the inguinal supraclavicular or cervical regions. Heart:regular rate and rhythm.  No murmurs  or gallops. Hardin: Clear to auscultation without any rhonchi, wheezes or dullness to percussion. Abdomin: soft, non-tender, nondistended with good bowel sounds. Neurological: No deficits noted on exam motor, sensory and deep tendon reflexes. Skin: No ecchymosis or petechiae. Musculoskeletal: No clubbing or cyanosis. Psychiatric: No changes in his mood or affect.    Lab Results: Lab Results  Component Value Date   WBC 4.2 07/26/2018   HGB 13.2 07/26/2018   HCT 39.7 07/26/2018   MCV 92.5 07/26/2018   PLT 259 07/26/2018     Chemistry      Component Value Date/Time   NA 137 07/26/2018 0939   K 4.1 07/26/2018 0939   CL 105 07/26/2018 0939   CO2 25 07/26/2018 0939   BUN 9 07/26/2018 0939   CREATININE 0.66 07/26/2018 0939   CREATININE 0.63 (L) 01/05/2017 0934      Component Value Date/Time   CALCIUM 8.7 (L) 07/26/2018 0939   ALKPHOS 60 07/26/2018 0939   AST 20 07/26/2018 0939   ALT 14 07/26/2018 0939   BILITOT 0.6 07/26/2018 0939        Impression and Plan:  84 year old man with:  1.  Stage IV squamous cell carcinoma of the of the skin with disease to the axilla and the right external iliac chain diagnosed in June 2019.    He remains on Libtayo without complications.  He continues to show excellent benefit with reduction in his lymphadenopathy at this time.  The natural course of his disease as well as risks and benefits of continuing therapy was reviewed today.  After discussion today the plan is to continue with the same dose and schedule and repeat imaging studies in October 2019.    2.  IV access: Peripheral veins remain in use at this time.  Risks and benefits of Port-A-Cath insertion was reviewed today he will continue to use peripheral veins for the time being.  3.  Antiemetics: No issues reported with nausea or vomiting at this time.  4.  Immune mediated complications: Continue to educate him about the possibility of developing immune mediated complications I  would be in the form of thyroid disease, pneumonitis, colitis among others.  5.  Goals of care and prognosis: The goal remains palliative although he is responding reasonably well at this time.  6.  Follow-up: Will be in 3 weeks for his next cycle of immunotherapy.  25  minutes was spent with the patient face-to-face today.  More than 50% of time was dedicated discussing the natural course of his disease, reviewing long-term complications of immunotherapy and reviewing his imaging studies.      [name], MD 8/20/20199:29 AM

## 2018-08-15 NOTE — Telephone Encounter (Signed)
Printed avs and calender of upcoming appointment. Per 8/20 los 

## 2018-08-15 NOTE — Patient Instructions (Signed)
[facility] Discharge Instructions for Patients Receiving Chemotherapy  Today you received the following chemotherapy agents: Cemiplimab (Libtayo)   To help prevent nausea and vomiting after your treatment, we encourage you to take your nausea medication  as prescribed.    If you develop nausea and vomiting that is not controlled by your nausea medication, call the clinic.   BELOW ARE SYMPTOMS THAT SHOULD BE REPORTED IMMEDIATELY:  *FEVER GREATER THAN 100.5 F  *CHILLS WITH OR WITHOUT FEVER  NAUSEA AND VOMITING THAT IS NOT CONTROLLED WITH YOUR NAUSEA MEDICATION  *UNUSUAL SHORTNESS OF BREATH  *UNUSUAL BRUISING OR BLEEDING  TENDERNESS IN MOUTH AND THROAT WITH OR WITHOUT PRESENCE OF ULCERS  *URINARY PROBLEMS  *BOWEL PROBLEMS  UNUSUAL RASH Items with * indicate a potential emergency and should be followed up as soon as possible.  Feel free to call the clinic should you have any questions or concerns. The clinic phone number is [phone].  Please show the CHEMO ALERT CARD at check-in to the Emergency Department and triage nurse.   

## 2018-08-30 ENCOUNTER — Ambulatory Visit: Payer: Medicare Other

## 2018-08-30 ENCOUNTER — Other Ambulatory Visit: Payer: Medicare Other

## 2018-08-30 ENCOUNTER — Ambulatory Visit: Payer: Medicare Other | Admitting: Oncology

## 2018-09-05 ENCOUNTER — Inpatient Hospital Stay (HOSPITAL_BASED_OUTPATIENT_CLINIC_OR_DEPARTMENT_OTHER): Payer: Medicare Other | Admitting: Oncology

## 2018-09-05 ENCOUNTER — Inpatient Hospital Stay: Payer: Medicare Other

## 2018-09-05 ENCOUNTER — Telehealth: Payer: Self-pay

## 2018-09-05 ENCOUNTER — Inpatient Hospital Stay: Payer: Medicare Other | Attending: Oncology

## 2018-09-05 VITALS — BP 161/58 | HR 74 | Temp 98.3°F | Resp 18 | Ht 68.0 in | Wt 147.1 lb

## 2018-09-05 DIAGNOSIS — Z5112 Encounter for antineoplastic immunotherapy: Secondary | ICD-10-CM | POA: Insufficient documentation

## 2018-09-05 DIAGNOSIS — C778 Secondary and unspecified malignant neoplasm of lymph nodes of multiple regions: Secondary | ICD-10-CM

## 2018-09-05 DIAGNOSIS — C44509 Unspecified malignant neoplasm of skin of other part of trunk: Secondary | ICD-10-CM

## 2018-09-05 DIAGNOSIS — C4492 Squamous cell carcinoma of skin, unspecified: Secondary | ICD-10-CM | POA: Insufficient documentation

## 2018-09-05 DIAGNOSIS — C44599 Other specified malignant neoplasm of skin of other part of trunk: Secondary | ICD-10-CM

## 2018-09-05 LAB — CMP (CANCER CENTER ONLY)
ALBUMIN: 3.6 g/dL (ref 3.5–5.0)
ALT: 13 U/L (ref 0–44)
ANION GAP: 11 (ref 5–15)
AST: 20 U/L (ref 15–41)
Alkaline Phosphatase: 64 U/L (ref 38–126)
BILIRUBIN TOTAL: 0.7 mg/dL (ref 0.3–1.2)
BUN: 10 mg/dL (ref 8–23)
CALCIUM: 9.2 mg/dL (ref 8.9–10.3)
CO2: 23 mmol/L (ref 22–32)
CREATININE: 0.69 mg/dL (ref 0.61–1.24)
Chloride: 103 mmol/L (ref 98–111)
GFR, Est AFR Am: >60 mL/min (ref >60)
GFR, Estimated: >60 mL/min (ref >60)
GLUCOSE: 91 mg/dL (ref 70–99)
POTASSIUM: 4.2 mmol/L (ref 3.5–5.1)
SODIUM: 137 mmol/L (ref 135–145)
Total Protein: 7.3 g/dL (ref 6.5–8.1)

## 2018-09-05 LAB — CBC WITH DIFFERENTIAL (CANCER CENTER ONLY)
BASOS PCT: 2 %
Basophils Absolute: 0.1 10*3/uL (ref 0.0–0.1)
Eosinophils Absolute: 0.2 10*3/uL (ref 0.0–0.5)
Eosinophils Relative: 4 %
HEMATOCRIT: 45.8 % (ref 38.4–49.9)
Hemoglobin: 15.2 g/dL (ref 13.0–17.1)
Lymphocytes Relative: 16 %
Lymphs Abs: 0.7 10*3/uL — ABNORMAL LOW (ref 0.9–3.3)
MCH: 31.2 pg (ref 27.2–33.4)
MCHC: 33.3 g/dL (ref 32.0–36.0)
MCV: 93.6 fL (ref 79.3–98.0)
MONO ABS: 0.6 10*3/uL (ref 0.1–0.9)
MONOS PCT: 13 %
Neutro Abs: 2.8 10*3/uL (ref 1.5–6.5)
Neutrophils Relative %: 65 %
Platelet Count: 278 10*3/uL (ref 140–400)
RBC: 4.89 MIL/uL (ref 4.20–5.82)
RDW: 14.9 % — AB (ref 11.0–14.6)
WBC Count: 4.2 10*3/uL (ref 4.0–10.3)

## 2018-09-05 MED ORDER — SODIUM CHLORIDE 0.9 % IV SOLN
350.0000 mg | Freq: Once | INTRAVENOUS | Status: AC
  Administered 2018-09-05: 350 mg via INTRAVENOUS
  Filled 2018-09-05: qty 7

## 2018-09-05 MED ORDER — SODIUM CHLORIDE 0.9 % IV SOLN
Freq: Once | INTRAVENOUS | Status: AC
  Administered 2018-09-05: 11:00:00 via INTRAVENOUS
  Filled 2018-09-05: qty 250

## 2018-09-05 NOTE — Progress Notes (Signed)
Hematology and Oncology Follow Up Visit  Jeffrey Hardin [id number] 06-04-1934 84 y.o. 09/05/2018 10:18 AM [name], MDLalonde, [name], MD   Principle Diagnosis: 84 year old man with squamous cell carcinoma of the skin and subsequently developed stage IV disease with axillary and inguinal adenopathy diagnosed in November 2018.     Prior Therapy: He underwent excision of a malignant lesion measuring 11 x 10 cm and layered closure completed on January 24, 2018 by Dr. Iran Planas.  The final pathology showed invasive squamous cell carcinoma with moderate differentiation measuring 8.8 cm with negative margins.  He did not receive any additional therapy.     Current therapy:  Libtayo 350 mg started on 06/23/2018.  He is here for cycle 4 of therapy.   Interim History: Jeffrey Hardin presents today with his wife.  Since the last visit, he continues to tolerate immunotherapy without any complications.  He continues to notice improvement in his inguinal as well as axillary lymphadenopathy without any complications related to therapy.  He denies any excessive fatigue or tiredness.  He denies any pruritus.  His performance status and activity level remain excellent.  His appetite and quality of life is improved with this therapy.  He does not report any headaches, blurry vision, syncope or seizures.  He denies any dizziness or altered mental status.  Does not report any fevers, chills or sweats.  Does not report any cough, wheezing or hemoptysis.  Does not report any chest pain, palpitation, orthopnea or leg edema.  Does not report any nausea, vomiting or abdominal pain.  Does not report any diarrhea.  Hematochezia or melena.  Does not report any pain or pathological fractures.   Does not report frequency, urgency or hematuria.  Does not report any ecchymosis or petechiae.  Does not report any lesions.  Does not report any anxiety or depression.  Remaining review of systems is negative.    Medications: I  have reviewed the patient's current medications.  Current Outpatient Medications  Medication Sig Dispense Refill  . amLODipine-benazepril (LOTREL) 10-20 MG capsule TAKE 1 CAPSULE DAILY 90 capsule 0  . aspirin EC 81 MG tablet Take 81 mg by mouth daily.    . betamethasone dipropionate (DIPROLENE) 0.05 % ointment USE AS DIRECTED 45 g 5  . calcium carbonate (TUMS - DOSED IN MG ELEMENTAL CALCIUM) 500 MG chewable tablet Chew 2 tablets by mouth daily as needed for indigestion or heartburn.    . Glucosamine HCl (GLUCOSAMINE PO) Take 1 tablet by mouth daily.    Marland Kitchen levothyroxine (SYNTHROID, LEVOTHROID) 100 MCG tablet TAKE 1 TABLET DAILY 90 tablet 4  . Multiple Vitamins-Minerals (MULTIVITAMIN WITH MINERALS) tablet Take 1 tablet by mouth daily.      . traMADol (ULTRAM) 50 MG tablet Take 1 tablet (50 mg total) by mouth every 12 (twelve) hours as needed. 20 tablet 0  . vitamin E 400 UNIT capsule Take 400 Units by mouth daily.     No current facility-administered medications for this visit.    Facility-Administered Medications Ordered in Other Visits  Medication Dose Route Frequency Provider Last Rate Last Dose  . influenza  inactive virus vaccine (FLUZONE/FLUARIX) injection 0.5 mL  0.5 mL Intramuscular Once [name], MD         Allergies: No Known Allergies  Past Medical History, Surgical history, Social history, and Family History remain unchanged on review.    Physical Exam: Blood pressure (!) 161/58, pulse 74, temperature 98.3 F (36.8 C), temperature source Oral, resp. rate  18, height 5\' 8"  (1.727 m), weight 147 lb 1.6 oz (66.7 kg), SpO2 97 %.    ECOG: 1   General appearance: Comfortable appearing without any discomfort Head: Normocephalic without any trauma Oropharynx: Mucous membranes are moist and pink without any thrush or ulcers. Eyes: Pupils are equal and round reactive to light. Lymph nodes:  Decreased right axillary lymphadenopathy.  His inguinal adenopathy has  resolved. Heart:regular rate and rhythm.  S1 and S2 without leg edema. Lung: Clear without any rhonchi or wheezes.  No dullness to percussion. Abdomin: Soft, nontender, nondistended with good bowel sounds.  No hepatosplenomegaly. Musculoskeletal: No joint deformity or effusion.  Full range of motion noted. Neurological: No deficits noted on motor, sensory and deep tendon reflex exam. Skin: No petechial rash or dryness.  Appeared moist.  Psychiatric: Mood and affect appeared appropriate.      Lab Results: Lab Results  Component Value Date   WBC 4.2 09/05/2018   HGB 15.2 09/05/2018   HCT 45.8 09/05/2018   MCV 93.6 09/05/2018   PLT 278 09/05/2018     Chemistry      Component Value Date/Time   NA 139 08/15/2018 0920   K 4.1 08/15/2018 0920   CL 105 08/15/2018 0920   CO2 25 08/15/2018 0920   BUN 11 08/15/2018 0920   CREATININE 0.73 08/15/2018 0920   CREATININE 0.63 (L) 01/05/2017 0934      Component Value Date/Time   CALCIUM 9.0 08/15/2018 0920   ALKPHOS 65 08/15/2018 0920   AST 22 08/15/2018 0920   ALT 16 08/15/2018 0920   BILITOT 0.8 08/15/2018 0920        Impression and Plan:  84 year old man with:  1.  Squamous cell carcinoma from the skin with stage IV documented metastatic disease to the axilla and iliac chain diagnosed in June 2019.    He is currently on Libtayo and continues to tolerate therapy without complications.  The long-term complication associated with immunotherapy and risks and benefits of continuing was reviewed.  He continues to have excellent clinical benefit with visible decrease in his lymphadenopathy.  After discussion today, the plan is to continue with same dose and schedule and repeat his CT scan after October 8 infusion.    2.  IV access: No issues reported with using peripheral veins.  Port-A-Cath can be used in the future if needed 2.  3.  Antiemetics: Antiemetics are available to him at this time.  4.  Immune mediated  complications: No issues reported at this time.  I continue to educate him about long-term complications such as thyroid disease, colitis, pneumonitis of this.  5.  Goals of care and prognosis: Therapy remains palliative but his performance status is excellent.  6.  Follow-up: Will be in 3 weeks for his next cycle of therapy.  15  minutes was spent with the patient face-to-face today.  More than 50% of time was dedicated discussing long-term prognosis, treatment approach and coordinating his future plan of care.      [name], MD 9/10/201910:18 AM

## 2018-09-05 NOTE — Telephone Encounter (Signed)
Printed avs and calender of upcoming appointment/. Per 9/10 los 

## 2018-09-05 NOTE — Patient Instructions (Signed)
[facility] Discharge Instructions for Patients Receiving Chemotherapy  Today you received the following chemotherapy agents: Cemiplimab (Libtayo)   To help prevent nausea and vomiting after your treatment, we encourage you to take your nausea medication  as prescribed.    If you develop nausea and vomiting that is not controlled by your nausea medication, call the clinic.   BELOW ARE SYMPTOMS THAT SHOULD BE REPORTED IMMEDIATELY:  *FEVER GREATER THAN 100.5 F  *CHILLS WITH OR WITHOUT FEVER  NAUSEA AND VOMITING THAT IS NOT CONTROLLED WITH YOUR NAUSEA MEDICATION  *UNUSUAL SHORTNESS OF BREATH  *UNUSUAL BRUISING OR BLEEDING  TENDERNESS IN MOUTH AND THROAT WITH OR WITHOUT PRESENCE OF ULCERS  *URINARY PROBLEMS  *BOWEL PROBLEMS  UNUSUAL RASH Items with * indicate a potential emergency and should be followed up as soon as possible.  Feel free to call the clinic should you have any questions or concerns. The clinic phone number is [phone].  Please show the CHEMO ALERT CARD at check-in to the Emergency Department and triage nurse.   

## 2018-10-03 ENCOUNTER — Telehealth: Payer: Self-pay

## 2018-10-03 ENCOUNTER — Inpatient Hospital Stay: Payer: Medicare Other

## 2018-10-03 ENCOUNTER — Inpatient Hospital Stay (HOSPITAL_BASED_OUTPATIENT_CLINIC_OR_DEPARTMENT_OTHER): Payer: Medicare Other | Admitting: Oncology

## 2018-10-03 ENCOUNTER — Inpatient Hospital Stay: Payer: Medicare Other | Attending: Oncology

## 2018-10-03 VITALS — BP 158/66 | HR 51 | Temp 98.7°F | Resp 17 | Ht 68.0 in | Wt 148.8 lb

## 2018-10-03 DIAGNOSIS — C4492 Squamous cell carcinoma of skin, unspecified: Secondary | ICD-10-CM | POA: Insufficient documentation

## 2018-10-03 DIAGNOSIS — Z5112 Encounter for antineoplastic immunotherapy: Secondary | ICD-10-CM | POA: Diagnosis not present

## 2018-10-03 DIAGNOSIS — R599 Enlarged lymph nodes, unspecified: Secondary | ICD-10-CM

## 2018-10-03 DIAGNOSIS — C778 Secondary and unspecified malignant neoplasm of lymph nodes of multiple regions: Secondary | ICD-10-CM | POA: Insufficient documentation

## 2018-10-03 DIAGNOSIS — Z79899 Other long term (current) drug therapy: Secondary | ICD-10-CM | POA: Diagnosis not present

## 2018-10-03 DIAGNOSIS — C44599 Other specified malignant neoplasm of skin of other part of trunk: Secondary | ICD-10-CM

## 2018-10-03 DIAGNOSIS — C44509 Unspecified malignant neoplasm of skin of other part of trunk: Secondary | ICD-10-CM

## 2018-10-03 LAB — CMP (CANCER CENTER ONLY)
ALBUMIN: 3.6 g/dL (ref 3.5–5.0)
ALT: 14 U/L (ref 0–44)
ANION GAP: 11 (ref 5–15)
AST: 21 U/L (ref 15–41)
Alkaline Phosphatase: 62 U/L (ref 38–126)
BUN: 10 mg/dL (ref 8–23)
CHLORIDE: 100 mmol/L (ref 98–111)
CO2: 25 mmol/L (ref 22–32)
Calcium: 9.2 mg/dL (ref 8.9–10.3)
Creatinine: 0.71 mg/dL (ref 0.61–1.24)
GFR, Est AFR Am: >60 mL/min (ref >60)
GFR, Estimated: >60 mL/min (ref >60)
GLUCOSE: 103 mg/dL — AB (ref 70–99)
POTASSIUM: 4.1 mmol/L (ref 3.5–5.1)
SODIUM: 136 mmol/L (ref 135–145)
Total Bilirubin: 0.7 mg/dL (ref 0.3–1.2)
Total Protein: 7.3 g/dL (ref 6.5–8.1)

## 2018-10-03 LAB — CBC WITH DIFFERENTIAL (CANCER CENTER ONLY)
Basophils Absolute: 0 10*3/uL (ref 0.0–0.1)
Basophils Relative: 1 %
Eosinophils Absolute: 0.1 10*3/uL (ref 0.0–0.5)
Eosinophils Relative: 1 %
HEMATOCRIT: 43.9 % (ref 38.4–49.9)
HEMOGLOBIN: 15.2 g/dL (ref 13.0–17.0)
LYMPHS PCT: 10 %
Lymphs Abs: 0.9 10*3/uL (ref 0.9–3.3)
MCH: 31.4 pg (ref 27.2–33.4)
MCHC: 34.6 g/dL (ref 32.0–36.0)
MCV: 90.7 fL (ref 79.3–98.0)
MONO ABS: 0.9 10*3/uL (ref 0.1–0.9)
MONOS PCT: 11 %
NEUTROS ABS: 6.7 10*3/uL — AB (ref 1.5–6.5)
NEUTROS PCT: 77 %
Platelet Count: 254 10*3/uL (ref 150–400)
RBC: 4.84 MIL/uL (ref 4.20–5.82)
RDW: 14 % (ref 11.0–14.6)
WBC Count: 8.6 10*3/uL (ref 4.0–10.5)
nRBC: 0 % (ref 0.0–0.2)

## 2018-10-03 MED ORDER — SODIUM CHLORIDE 0.9 % IV SOLN
350.0000 mg | Freq: Once | INTRAVENOUS | Status: AC
  Administered 2018-10-03: 350 mg via INTRAVENOUS
  Filled 2018-10-03: qty 7

## 2018-10-03 MED ORDER — SODIUM CHLORIDE 0.9 % IV SOLN
Freq: Once | INTRAVENOUS | Status: AC
  Administered 2018-10-03: 10:00:00 via INTRAVENOUS
  Filled 2018-10-03: qty 250

## 2018-10-03 NOTE — Patient Instructions (Signed)
[facility] Discharge Instructions for Patients Receiving Chemotherapy  Today you received the following chemotherapy agents: Cemiplimab-rwlc (Libtayo)  To help prevent nausea and vomiting after your treatment, we encourage you to take your nausea medication as directed.   If you develop nausea and vomiting that is not controlled by your nausea medication, call the clinic.   BELOW ARE SYMPTOMS THAT SHOULD BE REPORTED IMMEDIATELY:  *FEVER GREATER THAN 100.5 F  *CHILLS WITH OR WITHOUT FEVER  NAUSEA AND VOMITING THAT IS NOT CONTROLLED WITH YOUR NAUSEA MEDICATION  *UNUSUAL SHORTNESS OF BREATH  *UNUSUAL BRUISING OR BLEEDING  TENDERNESS IN MOUTH AND THROAT WITH OR WITHOUT PRESENCE OF ULCERS  *URINARY PROBLEMS  *BOWEL PROBLEMS  UNUSUAL RASH Items with * indicate a potential emergency and should be followed up as soon as possible.  Feel free to call the clinic should you have any questions or concerns. The clinic phone number is [phone].  Please show the [facility] at check-in to the Emergency Department and triage nurse.

## 2018-10-03 NOTE — Telephone Encounter (Signed)
Printed avs and calender of upcoming appointment. Per 10/8 los.  

## 2018-10-03 NOTE — Progress Notes (Signed)
Hematology and Oncology Follow Up Visit  Jeffrey Hardin [id number] 14-Jul-1934 84 y.o. 10/03/2018 8:56 AM Jeffrey Hardin, MDLalonde, Jeffrey Jarvis, MD   Principle Diagnosis: 84 year old man with stage IV squamous cell carcinoma of the skin with axillary and inguinal adenopathy diagnosed in November 2018.     Prior Therapy: He underwent excision of a malignant lesion measuring 11 x 10 cm and layered closure completed on January 24, 2018 by Dr. Iran Hardin.  The final pathology showed invasive squamous cell carcinoma with moderate differentiation measuring 8.8 cm with negative margins.  He did not receive any additional therapy.     Current therapy:  Libtayo 350 mg started on 06/23/2018.  He is here for cycle 5 of therapy.   Interim History: Jeffrey Hardin returns today for repeat evaluation.  Since last visit, continues to feel well without any major complaints.  He tolerated therapy without any new issues.  He does report some mild fatigue and pruritus but for the most part no major complaints.  He continues to have excellent reduction in his axillary and inguinal adenopathy that is palpable.  He was able to travel on a cruise in the last 3 weeks and was able to participate in most activities without any decline.  He does not report any headaches, blurry vision, syncope or seizures.  He denies any syncope or lethargy.  Does not report any fevers, chills or sweats.  Does not report any cough, wheezing or hemoptysis.  Does not report any chest pain, palpitation, orthopnea or leg edema.  Does not report any nausea, vomiting or abdominal pain.  Does not report any changes in his bowel habits or abdominal distention.  He denies any mucus or bleeding in his stool. Does not report any arthralgias or myalgias.  Does not report frequency, urgency or hematuria.  Does not report any skin rashes or lesions.  Does not report any mood changes.  Remaining review of systems is negative.    Medications: I have reviewed the  patient's current medications.  Current Outpatient Medications  Medication Sig Dispense Refill  . amLODipine-benazepril (LOTREL) 10-20 MG capsule TAKE 1 CAPSULE DAILY 90 capsule 0  . aspirin EC 81 MG tablet Take 81 mg by mouth daily.    . betamethasone dipropionate (DIPROLENE) 0.05 % ointment USE AS DIRECTED 45 g 5  . calcium carbonate (TUMS - DOSED IN MG ELEMENTAL CALCIUM) 500 MG chewable tablet Chew 2 tablets by mouth daily as needed for indigestion or heartburn.    . Glucosamine HCl (GLUCOSAMINE PO) Take 1 tablet by mouth daily.    Marland Kitchen levothyroxine (SYNTHROID, LEVOTHROID) 100 MCG tablet TAKE 1 TABLET DAILY 90 tablet 4  . Multiple Vitamins-Minerals (MULTIVITAMIN WITH MINERALS) tablet Take 1 tablet by mouth daily.      . traMADol (ULTRAM) 50 MG tablet Take 1 tablet (50 mg total) by mouth every 12 (twelve) hours as needed. 20 tablet 0  . vitamin E 400 UNIT capsule Take 400 Units by mouth daily.     No current facility-administered medications for this visit.    Facility-Administered Medications Ordered in Other Visits  Medication Dose Route Frequency Provider Last Rate Last Dose  . influenza  inactive virus vaccine (FLUZONE/FLUARIX) injection 0.5 mL  0.5 mL Intramuscular Once Jeffrey Lung, MD         Allergies: No Known Allergies  Past Medical History, Surgical history, Social history, and Family History remain unchanged on review.    Physical Exam: Blood pressure (!) 158/66, pulse (!) 51,  temperature 98.7 F (37.1 C), temperature source Oral, resp. rate 17, height 5\' 8"  (1.727 m), weight 148 lb 12.8 oz (67.5 kg), SpO2 98 %.    ECOG: 1   General appearance: Alert, awake without any distress. Head: Atraumatic without abnormalities Oropharynx: Without any thrush or ulcers. Eyes: No scleral icterus. Lymph nodes: No lymphadenopathy noted in the cervical, supraclavicular, or axillary nodes Heart:regular rate and rhythm, without any murmurs or gallops.   Hardin: Clear to  auscultation without any rhonchi, wheezes or dullness to percussion. Abdomin: Soft, nontender without any shifting dullness or ascites. Musculoskeletal: No clubbing or cyanosis. Neurological: No motor or sensory deficits. Skin: No rashes or lesions.       Lab Results: Lab Results  Component Value Date   WBC 4.2 09/05/2018   HGB 15.2 09/05/2018   HCT 45.8 09/05/2018   MCV 93.6 09/05/2018   PLT 278 09/05/2018     Chemistry      Component Value Date/Time   NA 137 09/05/2018 0932   K 4.2 09/05/2018 0932   CL 103 09/05/2018 0932   CO2 23 09/05/2018 0932   BUN 10 09/05/2018 0932   CREATININE 0.69 09/05/2018 0932   CREATININE 0.63 (L) 01/05/2017 0934      Component Value Date/Time   CALCIUM 9.2 09/05/2018 0932   ALKPHOS 64 09/05/2018 0932   AST 20 09/05/2018 0932   ALT 13 09/05/2018 0932   BILITOT 0.7 09/05/2018 0932        Impression and Plan:  84 year old man with:  1.  Stage IV squamous cell carcinoma from the skin diagnosed in June 2019.  He has adenopathy in the axillary and inguinal areas.  He veins on Libtayo immunotherapy without any complications.  Risks and benefits of continuing this therapy long-term was reviewed today.  Potential complications were also discussed.  The natural course of his disease was also reviewed and treatment strategies were also discussed.  The plan is to continue with the same dose and schedule for the time being given his excellent tolerance and improvement in his overall disease status.  We will repeat imaging studies before the next visit to document response.  At that time we will determine the duration of therapy accordingly.  He is agreeable with this plan at this time.    2.  IV access: Peripheral veins remains in use without any issues.  3.  Antiemetics: No nausea or vomiting reported at this time.  Compazine is available to him.  4.  Immune mediated complications: None reported at this time.  To continue to educate him  about this possibility.  5.  Goals of care and prognosis: Treatment is palliative but long-term disease remission is a possibility he achieves a complete response.  Aggressive therapy is warranted.  6.  Follow-up: Will be in 3 weeks for his next cycle of therapy.  25  minutes was spent with the patient face-to-face today.  More than 50% of time was dedicated discussing his disease status, treatment options as well as complications related to therapy.      [name], MD 10/8/20198:56 AM

## 2018-10-09 ENCOUNTER — Other Ambulatory Visit: Payer: Self-pay | Admitting: Family Medicine

## 2018-10-09 DIAGNOSIS — I119 Hypertensive heart disease without heart failure: Secondary | ICD-10-CM

## 2018-10-16 ENCOUNTER — Ambulatory Visit (HOSPITAL_COMMUNITY)
Admission: RE | Admit: 2018-10-16 | Discharge: 2018-10-16 | Disposition: A | Discharge status: Home / Self Care | Payer: Medicare Other | Source: Ambulatory Visit | Attending: Oncology | Admitting: Oncology

## 2018-10-16 ENCOUNTER — Encounter (HOSPITAL_COMMUNITY): Payer: Self-pay

## 2018-10-16 DIAGNOSIS — N400 Enlarged prostate without lower urinary tract symptoms: Secondary | ICD-10-CM | POA: Insufficient documentation

## 2018-10-16 DIAGNOSIS — R918 Other nonspecific abnormal finding of lung field: Secondary | ICD-10-CM | POA: Insufficient documentation

## 2018-10-16 DIAGNOSIS — I700 Atherosclerosis of aorta: Secondary | ICD-10-CM | POA: Insufficient documentation

## 2018-10-16 DIAGNOSIS — I2510 Atherosclerotic heart disease of native coronary artery without angina pectoris: Secondary | ICD-10-CM | POA: Insufficient documentation

## 2018-10-16 DIAGNOSIS — R599 Enlarged lymph nodes, unspecified: Secondary | ICD-10-CM | POA: Diagnosis not present

## 2018-10-16 DIAGNOSIS — C4492 Squamous cell carcinoma of skin, unspecified: Secondary | ICD-10-CM | POA: Diagnosis not present

## 2018-10-16 MED ORDER — SODIUM CHLORIDE 0.9 % IJ SOLN
INTRAMUSCULAR | Status: AC
  Filled 2018-10-16: qty 50

## 2018-10-16 MED ORDER — IOHEXOL 300 MG/ML  SOLN
100.0000 mL | Freq: Once | INTRAMUSCULAR | Status: AC | PRN
  Administered 2018-10-16: 100 mL via INTRAVENOUS

## 2018-10-21 IMAGING — CT CT CHEST W/ CM
2 of 5 series · 11 of 36 positions shown, 13 images · IV contrast (OMNIPAQUE)
Comparison: 06/20/2018.

CLINICAL DATA: Stage IV squamous cell carcinoma of the skin.

EXAM:
CT CHEST, ABDOMEN, AND PELVIS WITH CONTRAST
TECHNIQUE: Multidetector CT imaging of the chest, abdomen and pelvis was
performed following the standard protocol during bolus
administration of intravenous contrast.
CONTRAST:  100mL OMNIPAQUE IOHEXOL 300 MG/ML  SOLN

[Series 2: cap with · axial · 0.84mm/px · z∈[-401,+124]mm · 8 of 136 slices shown, 10 images]
[im 16/136  mediastinal]
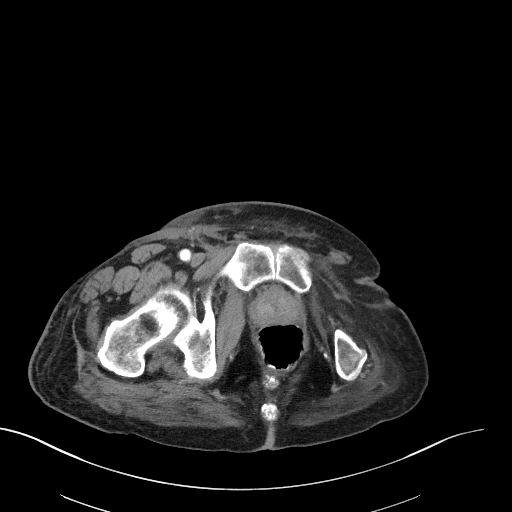
[im 16/136  lung]
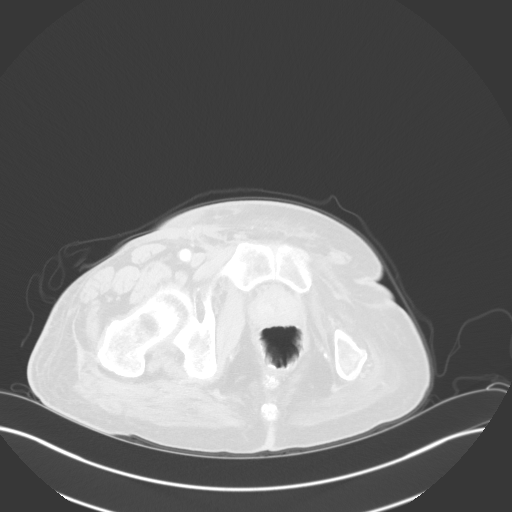
[im 31/136  lung]
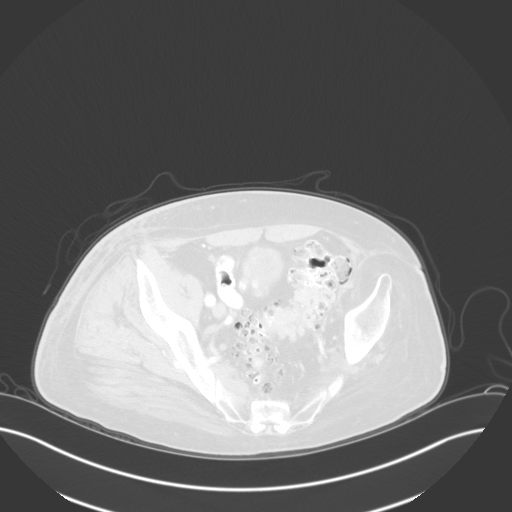
[im 46/136  lung]
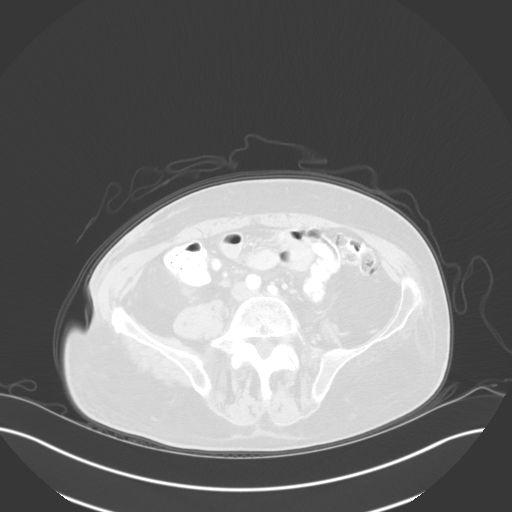
[im 61/136  lung]
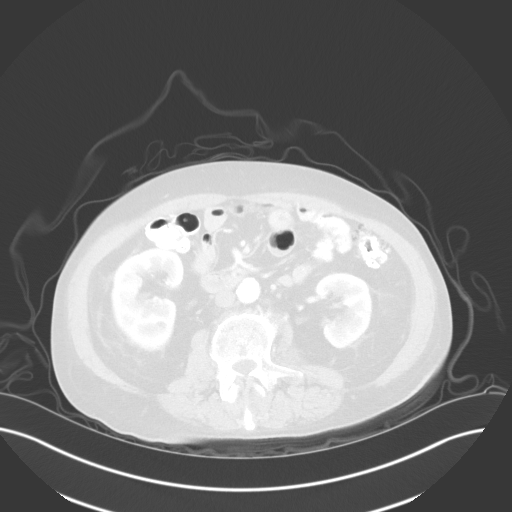
[im 76/136  mediastinal]
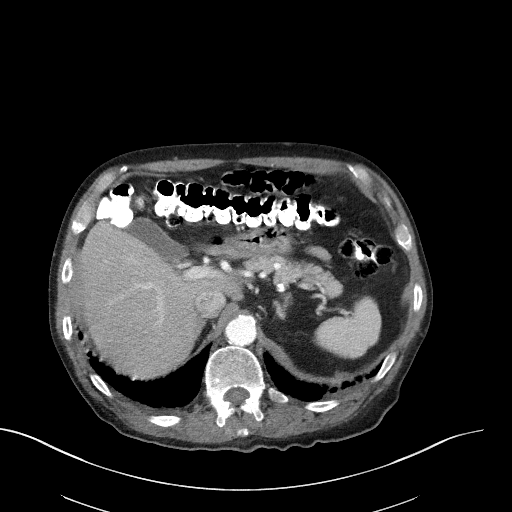
[im 76/136  lung]
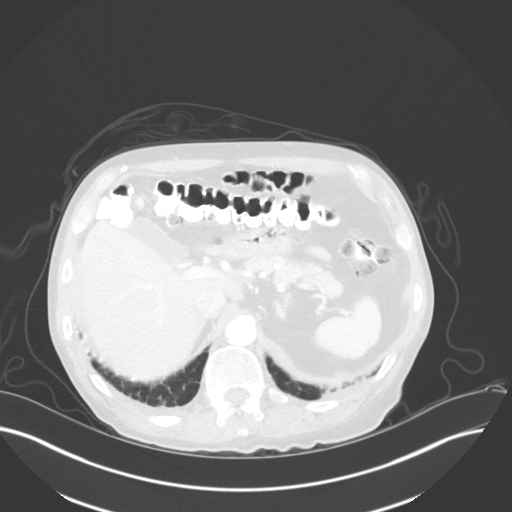
[im 91/136  lung]
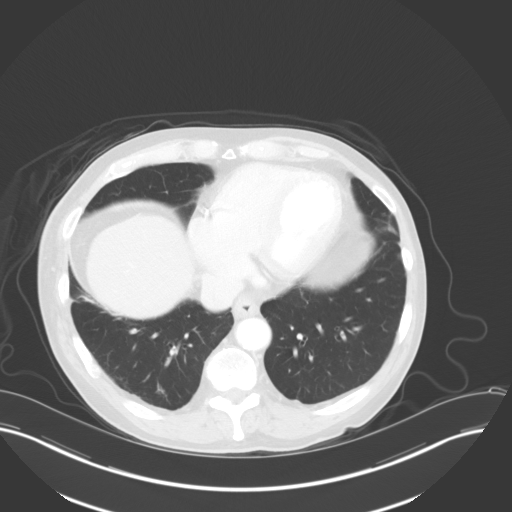
[im 106/136  lung]
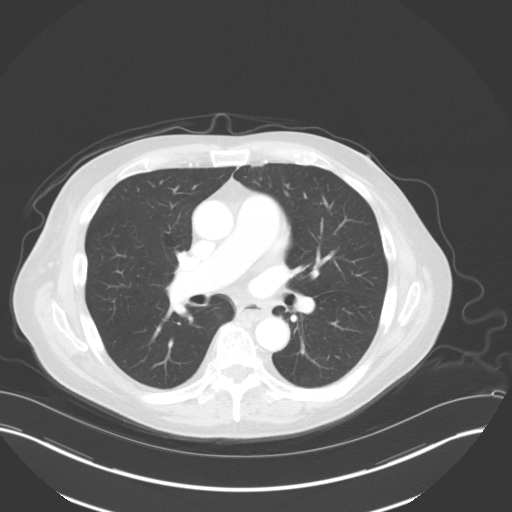
[im 121/136  lung]
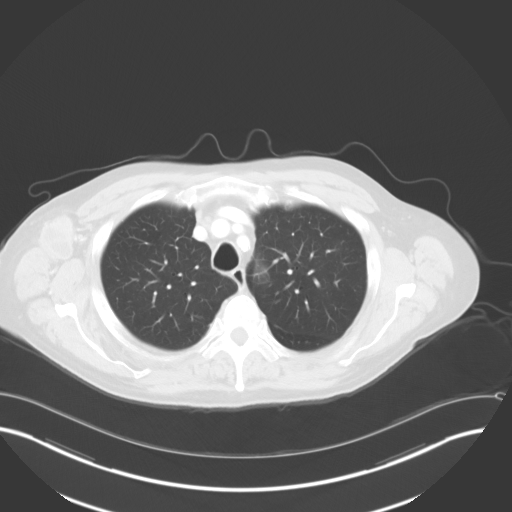

[Series 4: coronals · coronal · 0.88mm/px · 3 of 135 slices shown]
[im 27/135  lung]
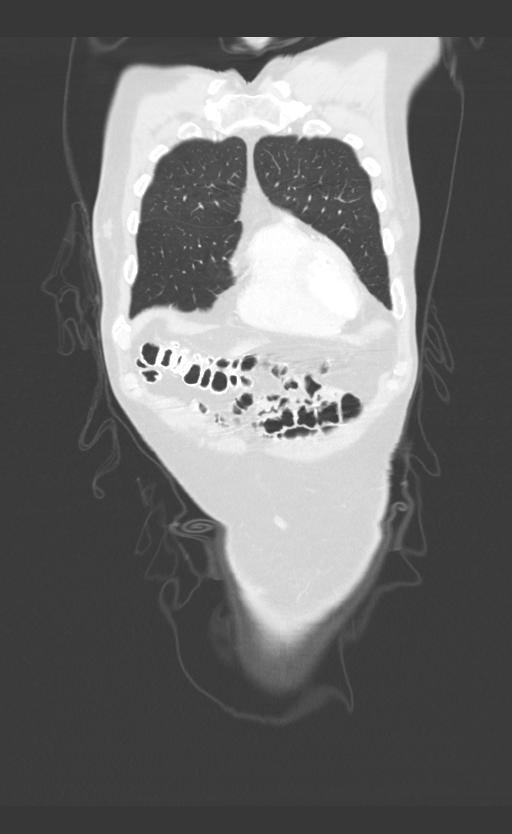
[im 54/135  lung]
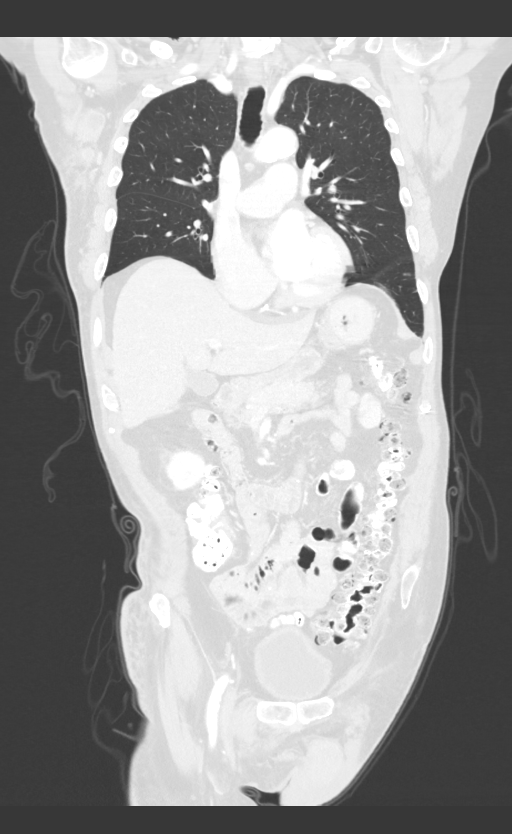
[im 81/135  lung]
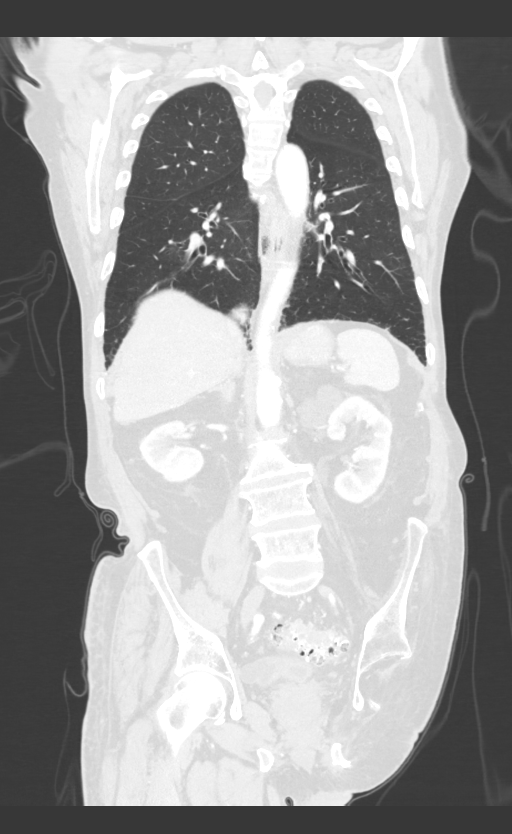

[11 of 36 positions shown; findings below may reference images not displayed]

FINDINGS: CT CHEST FINDINGS

Cardiovascular: Atherosclerotic calcification of the arterial
vasculature, including coronary arteries. Pulmonary arteries are
enlarged. Heart size normal. No pericardial effusion.

Mediastinum/Nodes: Necrotic right axillary lymph nodes measure up to
3.4 x 3.6 cm, previously 5.6 x 5.7 cm. Mediastinal lymph nodes are
not enlarged by CT size criteria. No hilar or left axillary
adenopathy. Esophagus is grossly unremarkable.

Lungs/Pleura: A few scattered basilar peripheral pulmonary nodules
measure 6 mm or less in size, as before. Mild septal thickening and
subpleural reticulation in the lung bases. No pleural fluid.

Musculoskeletal: Old rib fractures. Focal lucency in the right sixth
posterolateral rib (series 6, image 52) is unchanged. Mild
compression of the T3 superior endplate is unchanged.

CT ABDOMEN PELVIS FINDINGS

Hepatobiliary: Liver and gallbladder are unremarkable. No biliary
ductal dilatation.

Pancreas: Negative.

Spleen: Negative.

Adrenals/Urinary Tract: Adrenal glands are unremarkable.
Subcentimeter low-attenuation lesion in the right kidney is too
small to characterize. Exophytic cyst off the upper pole left kidney
measures 5 cm, as before. Duplicated right renal collecting system
which appears to join in the midportion of the right ureter. Ureters
are decompressed. Bladder diverticula bilaterally. Bladder is
otherwise grossly unremarkable.

Stomach/Bowel: Stomach, small bowel, appendix and colon are
unremarkable.

Vascular/Lymphatic: Atherosclerotic calcification of the arterial
vasculature without abdominal aortic aneurysm. No pathologically
enlarged lymph nodes.

Reproductive: Prostate is mildly enlarged.

Other: Postoperative changes in the right groin. Focal mildly
complex fluid anterior to the left iliac wing is unchanged. No free
fluid. Mesenteries and peritoneum are unremarkable.

Musculoskeletal: Left hindquarter amputation. Degenerative changes
in the spine.
IMPRESSION: 1. Interval decrease in size of right axillary adenopathy.
Previously seen right external iliac chain and right inguinal
adenopathy is no longer present.
2. A few scattered basilar peripheral pulmonary nodules are stable
in size.
3. Question mild basilar subpleural fibrosis.
4. Aortic atherosclerosis (SOATV-170.0). Coronary artery
calcification.
5. Enlarged pulmonary arteries, indicative of pulmonary arterial
hypertension.
6. Mildly enlarged prostate.

## 2018-10-24 ENCOUNTER — Telehealth: Payer: Self-pay | Admitting: Oncology

## 2018-10-24 ENCOUNTER — Other Ambulatory Visit: Payer: Self-pay | Admitting: Oncology

## 2018-10-24 ENCOUNTER — Inpatient Hospital Stay (HOSPITAL_BASED_OUTPATIENT_CLINIC_OR_DEPARTMENT_OTHER): Payer: Medicare Other | Admitting: Oncology

## 2018-10-24 ENCOUNTER — Inpatient Hospital Stay: Payer: Medicare Other

## 2018-10-24 VITALS — BP 161/67 | HR 56 | Temp 98.3°F | Resp 17 | Ht 68.0 in | Wt 149.3 lb

## 2018-10-24 DIAGNOSIS — C4492 Squamous cell carcinoma of skin, unspecified: Secondary | ICD-10-CM

## 2018-10-24 DIAGNOSIS — Z79899 Other long term (current) drug therapy: Secondary | ICD-10-CM

## 2018-10-24 DIAGNOSIS — Z5112 Encounter for antineoplastic immunotherapy: Secondary | ICD-10-CM | POA: Diagnosis not present

## 2018-10-24 DIAGNOSIS — C778 Secondary and unspecified malignant neoplasm of lymph nodes of multiple regions: Secondary | ICD-10-CM | POA: Diagnosis not present

## 2018-10-24 DIAGNOSIS — C44599 Other specified malignant neoplasm of skin of other part of trunk: Secondary | ICD-10-CM

## 2018-10-24 DIAGNOSIS — C44509 Unspecified malignant neoplasm of skin of other part of trunk: Secondary | ICD-10-CM

## 2018-10-24 LAB — CMP (CANCER CENTER ONLY)
ALK PHOS: 72 U/L (ref 38–126)
ALT: 11 U/L (ref 0–44)
AST: 20 U/L (ref 15–41)
Albumin: 3.3 g/dL — ABNORMAL LOW (ref 3.5–5.0)
Anion gap: 8 (ref 5–15)
BILIRUBIN TOTAL: 0.6 mg/dL (ref 0.3–1.2)
BUN: 12 mg/dL (ref 8–23)
CALCIUM: 9.1 mg/dL (ref 8.9–10.3)
CO2: 25 mmol/L (ref 22–32)
Chloride: 104 mmol/L (ref 98–111)
Creatinine: 0.82 mg/dL (ref 0.61–1.24)
Glucose, Bld: 101 mg/dL — ABNORMAL HIGH (ref 70–99)
Potassium: 4 mmol/L (ref 3.5–5.1)
Sodium: 137 mmol/L (ref 135–145)
Total Protein: 7.2 g/dL (ref 6.5–8.1)

## 2018-10-24 LAB — CBC WITH DIFFERENTIAL (CANCER CENTER ONLY)
Abs Immature Granulocytes: 0.02 10*3/uL (ref 0.00–0.07)
BASOS ABS: 0.1 10*3/uL (ref 0.0–0.1)
Basophils Relative: 1 %
EOS ABS: 0.2 10*3/uL (ref 0.0–0.5)
EOS PCT: 2 %
HEMATOCRIT: 42.2 % (ref 39.0–52.0)
Hemoglobin: 14.5 g/dL (ref 13.0–17.0)
Immature Granulocytes: 0 %
Lymphocytes Relative: 12 %
Lymphs Abs: 0.9 10*3/uL (ref 0.7–4.0)
MCH: 30.1 pg (ref 26.0–34.0)
MCHC: 34.4 g/dL (ref 30.0–36.0)
MCV: 87.7 fL (ref 80.0–100.0)
Monocytes Absolute: 0.9 10*3/uL (ref 0.1–1.0)
Monocytes Relative: 12 %
NRBC: 0 % (ref 0.0–0.2)
Neutro Abs: 5.6 10*3/uL (ref 1.7–7.7)
Neutrophils Relative %: 73 %
Platelet Count: 292 10*3/uL (ref 150–400)
RBC: 4.81 MIL/uL (ref 4.22–5.81)
RDW: 13.6 % (ref 11.5–15.5)
WBC: 7.6 10*3/uL (ref 4.0–10.5)

## 2018-10-24 LAB — TSH: TSH: 1.192 u[IU]/mL (ref 0.320–4.118)

## 2018-10-24 MED ORDER — SODIUM CHLORIDE 0.9 % IV SOLN
350.0000 mg | Freq: Once | INTRAVENOUS | Status: AC
  Administered 2018-10-24: 350 mg via INTRAVENOUS
  Filled 2018-10-24: qty 7

## 2018-10-24 MED ORDER — SODIUM CHLORIDE 0.9 % IV SOLN
Freq: Once | INTRAVENOUS | Status: AC
  Administered 2018-10-24: 11:00:00 via INTRAVENOUS
  Filled 2018-10-24: qty 250

## 2018-10-24 NOTE — Telephone Encounter (Signed)
Gave pt avs and calendar  °

## 2018-10-24 NOTE — Patient Instructions (Signed)
[facility] Discharge Instructions for Patients Receiving Chemotherapy  Today you received the following chemotherapy agents Libtayo  To help prevent nausea and vomiting after your treatment, we encourage you to take your nausea medication as directed.    If you develop nausea and vomiting that is not controlled by your nausea medication, call the clinic.   BELOW ARE SYMPTOMS THAT SHOULD BE REPORTED IMMEDIATELY:  *FEVER GREATER THAN 100.5 F  *CHILLS WITH OR WITHOUT FEVER  NAUSEA AND VOMITING THAT IS NOT CONTROLLED WITH YOUR NAUSEA MEDICATION  *UNUSUAL SHORTNESS OF BREATH  *UNUSUAL BRUISING OR BLEEDING  TENDERNESS IN MOUTH AND THROAT WITH OR WITHOUT PRESENCE OF ULCERS  *URINARY PROBLEMS  *BOWEL PROBLEMS  UNUSUAL RASH Items with * indicate a potential emergency and should be followed up as soon as possible.  Feel free to call the clinic should you have any questions or concerns. The clinic phone number is [phone].  Please show the CHEMO ALERT CARD at check-in to the Emergency Department and triage nurse.   

## 2018-10-24 NOTE — Telephone Encounter (Signed)
Pt requested his appts to be scheduled 1/3 due to plans on being out of town.

## 2018-10-24 NOTE — Progress Notes (Signed)
Hematology and Oncology Follow Up Visit  Jeffrey Hardin [id number] 05-14-1934 84 y.o. 10/24/2018 9:15 AM [name], MDLalonde, [name], MD   Principle Diagnosis: 84 year old man with stage IV squamous cell carcinoma diagnosed in November 2018.  He was found to have axillary and inguinal adenopathy arising from skin cancer primary.     Prior Therapy: He underwent excision of a malignant lesion measuring 11 x 10 cm and layered closure completed on January 24, 2018 by Dr. Iran Planas.  The final pathology showed invasive squamous cell carcinoma with moderate differentiation measuring 8.8 cm with negative margins.  He did not receive any additional therapy.     Current therapy:  Libtayo 350 mg started on 06/23/2018.  He completed 5 cycles of therapy.   Interim History: Mr. Jeffrey Hardin resents today for a follow-up.  Since the last visit, he reports no major changes or complaints.  He continues to attend to activities of daily living without any weakness or failure to thrive.  His appetite is excellent and denies any treatment related complications.  He denies any diarrhea or mucus in his stools.  He denies any difficulty breathing or cough.  He denies any infusion related complications.  His quality of life remain excellent.  He does not report any headaches, blurry vision, syncope or seizures.  He denies any alteration in mental status or dizziness.  Does not report any fevers, chills or sweats.  Does not report any cough, wheezing or hemoptysis.  Does not report any chest pain, palpitation, orthopnea or dyspnea on exertion. Does not report any nausea, vomiting or abdominal pain.  Does not report any constipation or diarrhea.  He denies any domino distention or early satiety. Does not report any bone pain or pathological fractures.  Does not report frequency, urgency or hematuria.  Does not report any skin rashes or lesions.  Does not report any anxiety or depression.  Remaining review of systems is  negative.    Medications: I have reviewed the patient's current medications.  Current Outpatient Medications  Medication Sig Dispense Refill  . amLODipine-benazepril (LOTREL) 10-20 MG capsule TAKE 1 CAPSULE DAILY 90 capsule 4  . aspirin EC 81 MG tablet Take 81 mg by mouth daily.    . betamethasone dipropionate (DIPROLENE) 0.05 % ointment USE AS DIRECTED 45 g 5  . calcium carbonate (TUMS - DOSED IN MG ELEMENTAL CALCIUM) 500 MG chewable tablet Chew 2 tablets by mouth daily as needed for indigestion or heartburn.    . Glucosamine HCl (GLUCOSAMINE PO) Take 1 tablet by mouth daily.    Marland Kitchen levothyroxine (SYNTHROID, LEVOTHROID) 100 MCG tablet TAKE 1 TABLET DAILY 90 tablet 4  . Multiple Vitamins-Minerals (MULTIVITAMIN WITH MINERALS) tablet Take 1 tablet by mouth daily.      . traMADol (ULTRAM) 50 MG tablet Take 1 tablet (50 mg total) by mouth every 12 (twelve) hours as needed. 20 tablet 0  . vitamin E 400 UNIT capsule Take 400 Units by mouth daily.     No current facility-administered medications for this visit.    Facility-Administered Medications Ordered in Other Visits  Medication Dose Route Frequency Provider Last Rate Last Dose  . influenza  inactive virus vaccine (FLUZONE/FLUARIX) injection 0.5 mL  0.5 mL Intramuscular Once [name], MD         Allergies: No Known Allergies  Past Medical History, Surgical history, Social history, and Family History remain unchanged on review.    Physical Exam:  Blood pressure (!) 161/67, pulse (!) 56,  temperature 98.3 F (36.8 C), temperature source Oral, resp. rate 17, height 5\' 8"  (1.727 m), weight 149 lb 4.8 oz (67.7 kg), SpO2 98 %.    ECOG: 1   General appearance: Comfortable appearing without any discomfort Head: Normocephalic without any trauma Oropharynx: Mucous membranes are moist and pink without any thrush or ulcers. Eyes: Pupils are equal and round reactive to light. Lymph nodes:  Decreased inguinal and axillary  lymphadenopathy. Heart:regular rate and rhythm.  S1 and S2 without leg edema. Lung: Clear without any rhonchi or wheezes.  No dullness to percussion. Abdomin: Soft, nontender, nondistended with good bowel sounds.  No hepatosplenomegaly. Musculoskeletal: No joint deformity or effusion.  Full range of motion noted. Neurological: No deficits noted on motor, sensory and deep tendon reflex exam. Skin: No petechial rash or dryness.  Appeared moist.        Lab Results: Lab Results  Component Value Date   WBC 8.6 10/03/2018   HGB 15.2 10/03/2018   HCT 43.9 10/03/2018   MCV 90.7 10/03/2018   PLT 254 10/03/2018     Chemistry      Component Value Date/Time   NA 136 10/03/2018 0832   K 4.1 10/03/2018 0832   CL 100 10/03/2018 0832   CO2 25 10/03/2018 0832   BUN 10 10/03/2018 0832   CREATININE 0.71 10/03/2018 0832   CREATININE 0.63 (L) 01/05/2017 0934      Component Value Date/Time   CALCIUM 9.2 10/03/2018 0832   ALKPHOS 62 10/03/2018 0832   AST 21 10/03/2018 0832   ALT 14 10/03/2018 0832   BILITOT 0.7 10/03/2018 0832     EXAM: CT CHEST, ABDOMEN, AND PELVIS WITH CONTRAST  TECHNIQUE: Multidetector CT imaging of the chest, abdomen and pelvis was performed following the standard protocol during bolus administration of intravenous contrast.  CONTRAST:  120mL OMNIPAQUE IOHEXOL 300 MG/ML  SOLN  COMPARISON:  06/20/2018.  FINDINGS: CT CHEST FINDINGS  Cardiovascular: Atherosclerotic calcification of the arterial vasculature, including coronary arteries. Pulmonary arteries are enlarged. Heart size normal. No pericardial effusion.  Mediastinum/Nodes: Necrotic right axillary lymph nodes measure up to 3.4 x 3.6 cm, previously 5.6 x 5.7 cm. Mediastinal lymph nodes are not enlarged by CT size criteria. No hilar or left axillary adenopathy. Esophagus is grossly unremarkable.  Lungs/Pleura: A few scattered basilar peripheral pulmonary nodules measure 6 mm or less in size, as  before. Mild septal thickening and subpleural reticulation in the lung bases. No pleural fluid.  Musculoskeletal: Old rib fractures. Focal lucency in the right sixth posterolateral rib (series 6, image 52) is unchanged. Mild compression of the T3 superior endplate is unchanged.  CT ABDOMEN PELVIS FINDINGS  Hepatobiliary: Liver and gallbladder are unremarkable. No biliary ductal dilatation.  Pancreas: Negative.  Spleen: Negative.  Adrenals/Urinary Tract: Adrenal glands are unremarkable. Subcentimeter low-attenuation lesion in the right kidney is too small to characterize. Exophytic cyst off the upper pole left kidney measures 5 cm, as before. Duplicated right renal collecting system which appears to join in the midportion of the right ureter. Ureters are decompressed. Bladder diverticula bilaterally. Bladder is otherwise grossly unremarkable.  Stomach/Bowel: Stomach, small bowel, appendix and colon are unremarkable.  Vascular/Lymphatic: Atherosclerotic calcification of the arterial vasculature without abdominal aortic aneurysm. No pathologically enlarged lymph nodes.  Reproductive: Prostate is mildly enlarged.  Other: Postoperative changes in the right groin. Focal mildly complex fluid anterior to the left iliac wing is unchanged. No free fluid. Mesenteries and peritoneum are unremarkable.  Musculoskeletal: Left hindquarter amputation. Degenerative changes in  the spine.  IMPRESSION: 1. Interval decrease in size of right axillary adenopathy. Previously seen right external iliac chain and right inguinal adenopathy is no longer present. 2. A few scattered basilar peripheral pulmonary nodules are stable in size. 3. Question mild basilar subpleural fibrosis. 4. Aortic atherosclerosis (ICD10-170.0). Coronary artery calcification. 5. Enlarged pulmonary arteries, indicative of pulmonary arterial hypertension. 6. Mildly enlarged prostate.   Impression and  Plan:  84 year old man with:  1.  Ssquamous cell carcinoma with documented metastatic disease to the axillary and inguinal lymph nodes diagnosed in June 2019.  He he is currently on Libtayo as tolerated therapy without any new issues or complaints.  CT scan on 10/16/2018 was personally reviewed and these were discussed with the patient and his family.  The scans showed positive response to therapy.  He has excellent response to therapy with resolution of his inguinal adenopathy and reduction of his right axillary lymphadenopathy.  Risks and benefits of continuing this therapy for the time being was reviewed today and he is agreeable to continue.   2.  IV access: The idea of returning Port-A-Cath was discussed with him again today but for the time being.  Peripheral veins have been adequate at this time would like to continue doing so.  3.  Antiemetics: Compazine has been prescribed for him.  No nausea or vomiting reported.  4.  Immune related complications: Continue to educate him about the potential complication associated with this therapy.  These were include thyroid disease, colitis, pneumonitis, arthritis among others.  He has no issues at this time.  5.  Goals of care and prognosis: Therapy remains palliative but his performance status is excellent and aggressive therapy is warranted.  6.  Follow-up: Will be in 3 weeks for his next cycle of therapy.  25  minutes was spent with the patient face-to-face today.  More than 50% of time was dedicated to reviewing his disease status, imaging studies and coordinating plan of care.     [name], MD 10/29/20199:15 AM

## 2018-11-14 ENCOUNTER — Inpatient Hospital Stay: Payer: Medicare Other

## 2018-11-14 ENCOUNTER — Telehealth: Payer: Self-pay

## 2018-11-14 ENCOUNTER — Inpatient Hospital Stay: Payer: Medicare Other | Attending: Oncology | Admitting: Oncology

## 2018-11-14 VITALS — BP 166/71 | HR 61 | Temp 98.2°F | Resp 17 | Ht 68.0 in | Wt 149.7 lb

## 2018-11-14 DIAGNOSIS — Z5112 Encounter for antineoplastic immunotherapy: Secondary | ICD-10-CM | POA: Diagnosis not present

## 2018-11-14 DIAGNOSIS — C4492 Squamous cell carcinoma of skin, unspecified: Secondary | ICD-10-CM

## 2018-11-14 DIAGNOSIS — C778 Secondary and unspecified malignant neoplasm of lymph nodes of multiple regions: Secondary | ICD-10-CM | POA: Insufficient documentation

## 2018-11-14 DIAGNOSIS — C44599 Other specified malignant neoplasm of skin of other part of trunk: Secondary | ICD-10-CM

## 2018-11-14 DIAGNOSIS — C44509 Unspecified malignant neoplasm of skin of other part of trunk: Secondary | ICD-10-CM

## 2018-11-14 LAB — CMP (CANCER CENTER ONLY)
ALK PHOS: 68 U/L (ref 38–126)
ALT: 12 U/L (ref 0–44)
ANION GAP: 10 (ref 5–15)
AST: 21 U/L (ref 15–41)
Albumin: 3.5 g/dL (ref 3.5–5.0)
BILIRUBIN TOTAL: 0.7 mg/dL (ref 0.3–1.2)
BUN: 14 mg/dL (ref 8–23)
CALCIUM: 9.8 mg/dL (ref 8.9–10.3)
CO2: 21 mmol/L — AB (ref 22–32)
CREATININE: 0.86 mg/dL (ref 0.61–1.24)
Chloride: 108 mmol/L (ref 98–111)
Glucose, Bld: 104 mg/dL — ABNORMAL HIGH (ref 70–99)
Potassium: 3.8 mmol/L (ref 3.5–5.1)
Sodium: 139 mmol/L (ref 135–145)
TOTAL PROTEIN: 7.4 g/dL (ref 6.5–8.1)

## 2018-11-14 LAB — CBC WITH DIFFERENTIAL (CANCER CENTER ONLY)
Abs Immature Granulocytes: 0.01 10*3/uL (ref 0.00–0.07)
BASOS PCT: 1 %
Basophils Absolute: 0.1 10*3/uL (ref 0.0–0.1)
EOS ABS: 0.2 10*3/uL (ref 0.0–0.5)
EOS PCT: 4 %
HEMATOCRIT: 43.7 % (ref 39.0–52.0)
Hemoglobin: 14.8 g/dL (ref 13.0–17.0)
IMMATURE GRANULOCYTES: 0 %
LYMPHS ABS: 0.8 10*3/uL (ref 0.7–4.0)
Lymphocytes Relative: 13 %
MCH: 30.1 pg (ref 26.0–34.0)
MCHC: 33.9 g/dL (ref 30.0–36.0)
MCV: 88.8 fL (ref 80.0–100.0)
Monocytes Absolute: 0.8 10*3/uL (ref 0.1–1.0)
Monocytes Relative: 12 %
NEUTROS PCT: 70 %
Neutro Abs: 4.4 10*3/uL (ref 1.7–7.7)
PLATELETS: 306 10*3/uL (ref 150–400)
RBC: 4.92 MIL/uL (ref 4.22–5.81)
RDW: 14.7 % (ref 11.5–15.5)
WBC Count: 6.3 10*3/uL (ref 4.0–10.5)
nRBC: 0 % (ref 0.0–0.2)

## 2018-11-14 MED ORDER — SODIUM CHLORIDE 0.9 % IV SOLN
Freq: Once | INTRAVENOUS | Status: AC
  Administered 2018-11-14: 10:00:00 via INTRAVENOUS
  Filled 2018-11-14: qty 250

## 2018-11-14 MED ORDER — SODIUM CHLORIDE 0.9 % IV SOLN
350.0000 mg | Freq: Once | INTRAVENOUS | Status: AC
  Administered 2018-11-14: 350 mg via INTRAVENOUS
  Filled 2018-11-14: qty 7

## 2018-11-14 NOTE — Patient Instructions (Signed)
[place] Discharge Instructions for Patients Receiving Chemotherapy  Today you received the following chemotherapy agents Cemiplimab (KYPROLIS).  To help prevent nausea and vomiting after your treatment, we encourage you to take your nausea medication as prescribed.   If you develop nausea and vomiting that is not controlled by your nausea medication, call the clinic.   BELOW ARE SYMPTOMS THAT SHOULD BE REPORTED IMMEDIATELY:  *FEVER GREATER THAN 100.5 F  *CHILLS WITH OR WITHOUT FEVER  NAUSEA AND VOMITING THAT IS NOT CONTROLLED WITH YOUR NAUSEA MEDICATION  *UNUSUAL SHORTNESS OF BREATH  *UNUSUAL BRUISING OR BLEEDING  TENDERNESS IN MOUTH AND THROAT WITH OR WITHOUT PRESENCE OF ULCERS  *URINARY PROBLEMS  *BOWEL PROBLEMS  UNUSUAL RASH Items with * indicate a potential emergency and should be followed up as soon as possible.  Feel free to call the clinic should you have any questions or concerns. The clinic phone number is [phone].  Please show the [place] at check-in to the Emergency Department and triage nurse.

## 2018-11-14 NOTE — Progress Notes (Signed)
Hematology and Oncology Follow Up Visit  Jeffrey Hardin [id number] 1934/06/20 84 y.o. 11/14/2018 8:56 AM [name], MDLalonde, [name], MD   Principle Diagnosis: 84 year old man with squamous cell carcinoma of the skin with documented metastatic disease to axillary and inguinal adenopathy indicating stage IV diagnosed in November 2018. .     Prior Therapy: He underwent excision of a malignant lesion measuring 11 x 10 cm and layered closure completed on January 24, 2018 by Dr. Iran Planas.  The final pathology showed invasive squamous cell carcinoma with moderate differentiation measuring 8.8 cm with negative margins.  He did not receive any additional therapy.     Current therapy:  Libtayo 350 mg started on 06/23/2018.  He is status post 6 cycles of therapy is here for cycle 7.   Interim History: Mr. Jeffrey Hardin is here for repeat evaluation.  Since the last visit, he reports no major complaints in his health.  He continues to tolerate therapy without any recent complaints.  He denies any excessive fatigue, tiredness or respiratory complaints.  He does not report any nausea or infusion related complications.  He remains active and continues to attend to activities of daily living without any decline.  His appetite and weight remains stable.  He does not report any headaches, blurry vision, syncope or seizures.  He denies any confusion or lethargy.  Does not report any fevers, chills or sweats.  Does not report any cough, wheezing or hemoptysis.  Does not report any chest pain, palpitation, orthopnea or dyspnea on exertion. Does not report any nausea, vomiting or distention. Does not report any changes in his bowel habits.  He denies any bleeding or clotting tendency. Does not report any arthralgias or myalgias.  Does not report frequency, urgency or hematuria.  Does not report any ecchymosis or petechiae.  Does not report any mood changes.  Remaining review of systems is negative.     Medications: I have reviewed the patient's current medications.  Current Outpatient Medications  Medication Sig Dispense Refill  . amLODipine-benazepril (LOTREL) 10-20 MG capsule TAKE 1 CAPSULE DAILY 90 capsule 4  . aspirin EC 81 MG tablet Take 81 mg by mouth daily.    . betamethasone dipropionate (DIPROLENE) 0.05 % ointment USE AS DIRECTED 45 g 5  . calcium carbonate (TUMS - DOSED IN MG ELEMENTAL CALCIUM) 500 MG chewable tablet Chew 2 tablets by mouth daily as needed for indigestion or heartburn.    . Glucosamine HCl (GLUCOSAMINE PO) Take 1 tablet by mouth daily.    Marland Kitchen levothyroxine (SYNTHROID, LEVOTHROID) 100 MCG tablet TAKE 1 TABLET DAILY 90 tablet 4  . Multiple Vitamins-Minerals (MULTIVITAMIN WITH MINERALS) tablet Take 1 tablet by mouth daily.      . traMADol (ULTRAM) 50 MG tablet Take 1 tablet (50 mg total) by mouth every 12 (twelve) hours as needed. 20 tablet 0  . vitamin E 400 UNIT capsule Take 400 Units by mouth daily.     No current facility-administered medications for this visit.    Facility-Administered Medications Ordered in Other Visits  Medication Dose Route Frequency Provider Last Rate Last Dose  . influenza  inactive virus vaccine (FLUZONE/FLUARIX) injection 0.5 mL  0.5 mL Intramuscular Once [name], MD         Allergies: No Known Allergies  Past Medical History, Surgical history, Social history, and Family History remain unchanged on review.    Physical Exam:  Blood pressure (!) 166/71, pulse 61, temperature 98.2 F (36.8 C), temperature source Oral,  resp. rate 17, height 5\' 8"  (1.727 m), weight 149 lb 11.2 oz (67.9 kg), SpO2 98 %.     ECOG: 1   General appearance: Alert, awake without any distress. Head: Atraumatic without abnormalities Oropharynx: Without any thrush or ulcers. Eyes: No scleral icterus. Lymph nodes:  Decrease in his axillary but still palpable right lymphadenopathy.  His right inguinal adenopathy is no longer  palpated. Heart:regular rate and rhythm, without any murmurs or gallops.    Right leg edema noted. Lung: Clear to auscultation without any rhonchi, wheezes or dullness to percussion. Abdomin: Soft, nontender without any shifting dullness or ascites. Musculoskeletal: Left leg prosthesis noted. Neurological: No motor or sensory deficits. Skin: No rashes or lesions. Psychiatric: Mood and affect appeared normal.        Lab Results: Lab Results  Component Value Date   WBC 7.6 10/24/2018   HGB 14.5 10/24/2018   HCT 42.2 10/24/2018   MCV 87.7 10/24/2018   PLT 292 10/24/2018     Chemistry      Component Value Date/Time   NA 137 10/24/2018 0856   K 4.0 10/24/2018 0856   CL 104 10/24/2018 0856   CO2 25 10/24/2018 0856   BUN 12 10/24/2018 0856   CREATININE 0.82 10/24/2018 0856   CREATININE 0.63 (L) 01/05/2017 0934      Component Value Date/Time   CALCIUM 9.1 10/24/2018 0856   ALKPHOS 72 10/24/2018 0856   AST 20 10/24/2018 0856   ALT 11 10/24/2018 0856   BILITOT 0.6 10/24/2018 0856      IMPRESSION: 1. Interval decrease in size of right axillary adenopathy. Previously seen right external iliac chain and right inguinal adenopathy is no longer present. 2. A few scattered basilar peripheral pulmonary nodules are stable in size. 3. Question mild basilar subpleural fibrosis. 4. Aortic atherosclerosis (ICD10-170.0). Coronary artery calcification. 5. Enlarged pulmonary arteries, indicative of pulmonary arterial hypertension. 6. Mildly enlarged prostate.   Impression and Plan:  84 year old man with:  1.  Stage IV squamous cell carcinoma of the skin with axillary and inguinal adenopathy.  He remains on Libtayo without any major complications.  He had excellent response to therapy based on his CT scan October 2019 which was reviewed again.  Risks and benefits of continuing this therapy was discussed today.  I recommended continuing therapy and repeat CT scan in 3 months.   He is agreeable with this plan.   2.  IV access: Peripheral veins remains adequate at this time.  3.  Antiemetics: No nausea or vomiting reported at this time.  Antiemetics are available to him.  4.  Immune related complications: No major issues reported at this time.  Long-term complications associated with immunotherapy will continue to be monitored.  These were include thyroid disease, pneumonitis, colitis, arthritis among others.  5.  Goals of care and prognosis: Therapy remains palliative although his performance status is excellent and aggressive therapy is warranted.  6.  Follow-up: Will be in 3 weeks for his next cycle of therapy.  25  minutes was spent with the patient face-to-face today.  More than 50% of time was spent on updating his disease status, treatment options and addressing complications related to therapy.     [name], MD 11/19/20198:56 AM

## 2018-11-14 NOTE — Telephone Encounter (Signed)
Printed avs calender of upcoming appointment. Per 11/19 los

## 2018-12-05 ENCOUNTER — Telehealth: Payer: Self-pay | Admitting: Oncology

## 2018-12-05 ENCOUNTER — Inpatient Hospital Stay: Payer: Medicare Other | Attending: Oncology

## 2018-12-05 ENCOUNTER — Inpatient Hospital Stay: Payer: Medicare Other

## 2018-12-05 ENCOUNTER — Inpatient Hospital Stay (HOSPITAL_BASED_OUTPATIENT_CLINIC_OR_DEPARTMENT_OTHER): Payer: Medicare Other | Admitting: Oncology

## 2018-12-05 VITALS — BP 148/67 | HR 60 | Temp 97.8°F | Resp 17 | Ht 68.0 in | Wt 145.4 lb

## 2018-12-05 DIAGNOSIS — C4492 Squamous cell carcinoma of skin, unspecified: Secondary | ICD-10-CM | POA: Insufficient documentation

## 2018-12-05 DIAGNOSIS — C778 Secondary and unspecified malignant neoplasm of lymph nodes of multiple regions: Secondary | ICD-10-CM | POA: Insufficient documentation

## 2018-12-05 DIAGNOSIS — Z5112 Encounter for antineoplastic immunotherapy: Secondary | ICD-10-CM | POA: Diagnosis not present

## 2018-12-05 DIAGNOSIS — C44509 Unspecified malignant neoplasm of skin of other part of trunk: Secondary | ICD-10-CM

## 2018-12-05 DIAGNOSIS — Z79899 Other long term (current) drug therapy: Secondary | ICD-10-CM | POA: Diagnosis not present

## 2018-12-05 DIAGNOSIS — C44599 Other specified malignant neoplasm of skin of other part of trunk: Secondary | ICD-10-CM

## 2018-12-05 LAB — CBC WITH DIFFERENTIAL (CANCER CENTER ONLY)
Abs Immature Granulocytes: 0.03 10*3/uL (ref 0.00–0.07)
BASOS ABS: 0.1 10*3/uL (ref 0.0–0.1)
BASOS PCT: 1 %
Eosinophils Absolute: 0.2 10*3/uL (ref 0.0–0.5)
Eosinophils Relative: 3 %
HCT: 41.6 % (ref 39.0–52.0)
Hemoglobin: 14.1 g/dL (ref 13.0–17.0)
IMMATURE GRANULOCYTES: 0 %
LYMPHS ABS: 0.7 10*3/uL (ref 0.7–4.0)
Lymphocytes Relative: 8 %
MCH: 30.5 pg (ref 26.0–34.0)
MCHC: 33.9 g/dL (ref 30.0–36.0)
MCV: 89.8 fL (ref 80.0–100.0)
Monocytes Absolute: 1 10*3/uL (ref 0.1–1.0)
Monocytes Relative: 11 %
NEUTROS PCT: 77 %
NRBC: 0 % (ref 0.0–0.2)
Neutro Abs: 6.9 10*3/uL (ref 1.7–7.7)
PLATELETS: 310 10*3/uL (ref 150–400)
RBC: 4.63 MIL/uL (ref 4.22–5.81)
RDW: 15.5 % (ref 11.5–15.5)
WBC Count: 8.9 10*3/uL (ref 4.0–10.5)

## 2018-12-05 LAB — CMP (CANCER CENTER ONLY)
ALBUMIN: 3.5 g/dL (ref 3.5–5.0)
ALT: 16 U/L (ref 0–44)
AST: 34 U/L (ref 15–41)
Alkaline Phosphatase: 69 U/L (ref 38–126)
Anion gap: 10 (ref 5–15)
BILIRUBIN TOTAL: 0.6 mg/dL (ref 0.3–1.2)
BUN: 15 mg/dL (ref 8–23)
CO2: 19 mmol/L — AB (ref 22–32)
Calcium: 9.7 mg/dL (ref 8.9–10.3)
Chloride: 109 mmol/L (ref 98–111)
Creatinine: 0.82 mg/dL (ref 0.61–1.24)
GFR, Est AFR Am: >60 mL/min (ref >60)
Glucose, Bld: 112 mg/dL — ABNORMAL HIGH (ref 70–99)
POTASSIUM: 3.6 mmol/L (ref 3.5–5.1)
Sodium: 138 mmol/L (ref 135–145)
TOTAL PROTEIN: 7.5 g/dL (ref 6.5–8.1)

## 2018-12-05 MED ORDER — SODIUM CHLORIDE 0.9 % IV SOLN
350.0000 mg | Freq: Once | INTRAVENOUS | Status: AC
  Administered 2018-12-05: 350 mg via INTRAVENOUS
  Filled 2018-12-05: qty 7

## 2018-12-05 MED ORDER — SODIUM CHLORIDE 0.9 % IV SOLN
Freq: Once | INTRAVENOUS | Status: AC
  Administered 2018-12-05: 10:00:00 via INTRAVENOUS
  Filled 2018-12-05: qty 250

## 2018-12-05 NOTE — Telephone Encounter (Signed)
Printed calendar and avs. °

## 2018-12-05 NOTE — Patient Instructions (Signed)
[facility] Discharge Instructions for Patients Receiving Chemotherapy  Today you received the following chemotherapy agents Libtayo  To help prevent nausea and vomiting after your treatment, we encourage you to take your nausea medication as directed.    If you develop nausea and vomiting that is not controlled by your nausea medication, call the clinic.   BELOW ARE SYMPTOMS THAT SHOULD BE REPORTED IMMEDIATELY:  *FEVER GREATER THAN 100.5 F  *CHILLS WITH OR WITHOUT FEVER  NAUSEA AND VOMITING THAT IS NOT CONTROLLED WITH YOUR NAUSEA MEDICATION  *UNUSUAL SHORTNESS OF BREATH  *UNUSUAL BRUISING OR BLEEDING  TENDERNESS IN MOUTH AND THROAT WITH OR WITHOUT PRESENCE OF ULCERS  *URINARY PROBLEMS  *BOWEL PROBLEMS  UNUSUAL RASH Items with * indicate a potential emergency and should be followed up as soon as possible.  Feel free to call the clinic should you have any questions or concerns. The clinic phone number is [phone].  Please show the CHEMO ALERT CARD at check-in to the Emergency Department and triage nurse.   

## 2018-12-05 NOTE — Progress Notes (Signed)
Hematology and Oncology Follow Up Visit  Jeffrey Hardin [id number] 07-13-1934 84 y.o. 12/05/2018 8:57 AM [name], MDLalonde, Jeffrey Jarvis, MD   Principle Diagnosis: 84 year old man with stage IV squamous cell carcinoma with axillary and inguinal adenopathy diagnosed in November 2018.  He was originally found to have squamous cell carcinoma of the skin.   Prior Therapy: He underwent excision of a malignant lesion measuring 11 x 10 cm and layered closure completed on January 24, 2018 by Dr. Iran Planas.  The final pathology showed invasive squamous cell carcinoma with moderate differentiation measuring 8.8 cm with negative margins.  He did not receive any additional therapy.     Current therapy:  Libtayo 350 mg every 3 weeks started on 06/23/2018.  He is here for cycle 8 of therapy.   Interim History: Jeffrey Hardin returns today for a follow-up.  Since last visit, he reports no major changes or complaints.  He has tolerated therapy without any new issues.  He does report some mild mouth dryness but no skin irritation otherwise.  He denies any excessive fatigue or tiredness.  He remains active and attends to activities of daily living.  His appetite is excellent and has not reported any changes.  His weight is slightly down but overall his quality of life is maintained.  He does not report any headaches, blurry vision, syncope or seizures.  He denies any alteration in mental status or dizziness.  Does not report any fevers, chills or sweats.  Does not report any cough, wheezing or hemoptysis.  Does not report any chest pain, palpitation, orthopnea or dyspnea on exertion. Does not report any nausea, vomiting or abdominal pain. Does not report any constipation or diarrhea.  He denies any bleeding or clotting tendency. Does not report any pathological fracture or deformities.  Does not report frequency, urgency or hematuria.  Does not report any bleeding or clotting tendency.  Denies any skin rashes or  lesions.  Does not report any anxiety or depression.  Remaining review of systems is negative.    Medications: I have reviewed the patient's current medications.  Current Outpatient Medications  Medication Sig Dispense Refill  . amLODipine-benazepril (LOTREL) 10-20 MG capsule TAKE 1 CAPSULE DAILY 90 capsule 4  . aspirin EC 81 MG tablet Take 81 mg by mouth daily.    . betamethasone dipropionate (DIPROLENE) 0.05 % ointment USE AS DIRECTED 45 g 5  . calcium carbonate (TUMS - DOSED IN MG ELEMENTAL CALCIUM) 500 MG chewable tablet Chew 2 tablets by mouth daily as needed for indigestion or heartburn.    . Glucosamine HCl (GLUCOSAMINE PO) Take 1 tablet by mouth daily.    Marland Kitchen levothyroxine (SYNTHROID, LEVOTHROID) 100 MCG tablet TAKE 1 TABLET DAILY 90 tablet 4  . Multiple Vitamins-Minerals (MULTIVITAMIN WITH MINERALS) tablet Take 1 tablet by mouth daily.      . traMADol (ULTRAM) 50 MG tablet Take 1 tablet (50 mg total) by mouth every 12 (twelve) hours as needed. 20 tablet 0  . vitamin E 400 UNIT capsule Take 400 Units by mouth daily.     No current facility-administered medications for this visit.    Facility-Administered Medications Ordered in Other Visits  Medication Dose Route Frequency Provider Last Rate Last Dose  . influenza  inactive virus vaccine (FLUZONE/FLUARIX) injection 0.5 mL  0.5 mL Intramuscular Once [name], MD         Allergies: No Known Allergies  Past Medical History, Surgical history, Social history, and Family History remain unchanged  on review.    Physical Exam:  Blood pressure (!) 148/67, pulse 60, temperature 97.8 F (36.6 C), temperature source Oral, resp. rate 17, height 5\' 8"  (1.727 m), weight 145 lb 6.4 oz (66 kg), SpO2 99 %.     ECOG: 1   General appearance: Comfortable appearing without any discomfort Head: Normocephalic without any trauma Oropharynx: Mucous membranes are moist and pink without any thrush or ulcers. Eyes: Pupils are equal and  round reactive to light. Lymph nodes:  His right inguinal adenopathy has resolved.  His right axillary lymph node remains slightly palpable. Heart:regular rate and rhythm.  S1 and S2 without leg edema. Lung: Clear without any rhonchi or wheezes.  No dullness to percussion. Abdomin: Soft, nontender, nondistended with good bowel sounds.  No hepatosplenomegaly. Musculoskeletal: No joint deformity or effusion.  Full range of motion noted. Neurological: No deficits noted on motor, sensory and deep tendon reflex exam. Skin: No petechial rash or dryness.  Appeared moist.         Lab Results: Lab Results  Component Value Date   WBC 8.9 12/05/2018   HGB 14.1 12/05/2018   HCT 41.6 12/05/2018   MCV 89.8 12/05/2018   PLT 310 12/05/2018     Chemistry      Component Value Date/Time   NA 139 11/14/2018 0848   K 3.8 11/14/2018 0848   CL 108 11/14/2018 0848   CO2 21 (L) 11/14/2018 0848   BUN 14 11/14/2018 0848   CREATININE 0.86 11/14/2018 0848   CREATININE 0.63 (L) 01/05/2017 0934      Component Value Date/Time   CALCIUM 9.8 11/14/2018 0848   ALKPHOS 68 11/14/2018 0848   AST 21 11/14/2018 0848   ALT 12 11/14/2018 0848   BILITOT 0.7 11/14/2018 0848         Impression and Plan:  84 year old man with:  1.  Squamous cell carcinoma of the skin with metastatic disease to axillary and inguinal adenopathy.  This was documented in June 2019.     He continues to tolerate Libtayo immunotherapy without any issues.  Imaging studies in October 2019 showed positive response to therapy without any residual issues or complications.  Risks and benefits of continuing this therapy for the time being was discussed.  Long-term complication associated with this treatment as well as alternative therapies were reviewed.  At this time is agreeable to continue and will repeat imaging studies in January 2020.   2.  IV access: No issues reported with using his peripheral veins.  3.  Antiemetics:  Compazine is available to him without any nausea or vomiting.  4.  Immune related complications: Long-term complication associated with immunotherapy were reiterated.  These include pneumonitis, colitis, arthritis and thyroid disease.  We will continue to monitor these moving forward.  5.  Goals of care and prognosis: Therapy remains palliative but his performance status is excellent and aggressive therapy is warranted.  6.  Follow-up: In 3 weeks for reevaluation and next cycle of therapy.  25  minutes was spent with the patient face-to-face today.  More than 50% of time was spent on assessing the natural course of his disease, treatment options and coordinating plan of care.     [name], MD 12/10/20198:57 AM

## 2018-12-26 ENCOUNTER — Inpatient Hospital Stay: Payer: Medicare Other

## 2018-12-26 ENCOUNTER — Telehealth: Payer: Self-pay | Admitting: Oncology

## 2018-12-26 ENCOUNTER — Inpatient Hospital Stay (HOSPITAL_BASED_OUTPATIENT_CLINIC_OR_DEPARTMENT_OTHER): Payer: Medicare Other | Admitting: Oncology

## 2018-12-26 VITALS — BP 146/60 | HR 52 | Temp 97.9°F | Resp 18 | Ht 68.0 in | Wt 142.6 lb

## 2018-12-26 DIAGNOSIS — C44509 Unspecified malignant neoplasm of skin of other part of trunk: Secondary | ICD-10-CM

## 2018-12-26 DIAGNOSIS — C778 Secondary and unspecified malignant neoplasm of lymph nodes of multiple regions: Secondary | ICD-10-CM

## 2018-12-26 DIAGNOSIS — C4492 Squamous cell carcinoma of skin, unspecified: Secondary | ICD-10-CM

## 2018-12-26 DIAGNOSIS — Z5112 Encounter for antineoplastic immunotherapy: Secondary | ICD-10-CM | POA: Diagnosis not present

## 2018-12-26 DIAGNOSIS — Z79899 Other long term (current) drug therapy: Secondary | ICD-10-CM | POA: Diagnosis not present

## 2018-12-26 DIAGNOSIS — C44599 Other specified malignant neoplasm of skin of other part of trunk: Secondary | ICD-10-CM

## 2018-12-26 LAB — CMP (CANCER CENTER ONLY)
ALBUMIN: 3.1 g/dL — AB (ref 3.5–5.0)
ALT: 13 U/L (ref 0–44)
AST: 21 U/L (ref 15–41)
Alkaline Phosphatase: 71 U/L (ref 38–126)
Anion gap: 9 (ref 5–15)
BUN: 11 mg/dL (ref 8–23)
CHLORIDE: 109 mmol/L (ref 98–111)
CO2: 19 mmol/L — AB (ref 22–32)
Calcium: 10 mg/dL (ref 8.9–10.3)
Creatinine: 0.74 mg/dL (ref 0.61–1.24)
GFR, Est AFR Am: >60 mL/min (ref >60)
GFR, Estimated: >60 mL/min (ref >60)
Glucose, Bld: 102 mg/dL — ABNORMAL HIGH (ref 70–99)
Potassium: 3.7 mmol/L (ref 3.5–5.1)
SODIUM: 137 mmol/L (ref 135–145)
Total Bilirubin: 0.5 mg/dL (ref 0.3–1.2)
Total Protein: 7.1 g/dL (ref 6.5–8.1)

## 2018-12-26 LAB — CBC WITH DIFFERENTIAL (CANCER CENTER ONLY)
Abs Immature Granulocytes: 0.01 10*3/uL (ref 0.00–0.07)
BASOS PCT: 1 %
Basophils Absolute: 0.1 10*3/uL (ref 0.0–0.1)
Eosinophils Absolute: 0.3 10*3/uL (ref 0.0–0.5)
Eosinophils Relative: 4 %
HCT: 39.2 % (ref 39.0–52.0)
Hemoglobin: 13.1 g/dL (ref 13.0–17.0)
Immature Granulocytes: 0 %
Lymphocytes Relative: 11 %
Lymphs Abs: 0.8 10*3/uL (ref 0.7–4.0)
MCH: 30.8 pg (ref 26.0–34.0)
MCHC: 33.4 g/dL (ref 30.0–36.0)
MCV: 92.2 fL (ref 80.0–100.0)
Monocytes Absolute: 0.8 10*3/uL (ref 0.1–1.0)
Monocytes Relative: 10 %
NRBC: 0 % (ref 0.0–0.2)
Neutro Abs: 5.6 10*3/uL (ref 1.7–7.7)
Neutrophils Relative %: 74 %
Platelet Count: 186 10*3/uL (ref 150–400)
RBC: 4.25 MIL/uL (ref 4.22–5.81)
RDW: 14.7 % (ref 11.5–15.5)
WBC Count: 7.5 10*3/uL (ref 4.0–10.5)

## 2018-12-26 LAB — TSH: TSH: 2.382 u[IU]/mL (ref 0.320–4.118)

## 2018-12-26 MED ORDER — SODIUM CHLORIDE 0.9 % IV SOLN
Freq: Once | INTRAVENOUS | Status: AC
  Administered 2018-12-26: 10:00:00 via INTRAVENOUS
  Filled 2018-12-26: qty 250

## 2018-12-26 MED ORDER — SODIUM CHLORIDE 0.9 % IV SOLN
350.0000 mg | Freq: Once | INTRAVENOUS | Status: AC
  Administered 2018-12-26: 350 mg via INTRAVENOUS
  Filled 2018-12-26: qty 7

## 2018-12-26 NOTE — Telephone Encounter (Signed)
Printed calendar and avs. °

## 2018-12-26 NOTE — Progress Notes (Signed)
Hematology and Oncology Follow Up Visit  Jeffrey Hardin [id number] [birth year] 84 y.o. 12/26/2018 9:31 AM Jeffrey Hardin, MDLalonde, [name], MD   Principle Diagnosis: 84 year old man with squamous cell carcinoma of the skin with metastatic disease to axillary and inguinal adenopathy noted in November 2018.     Prior Therapy: He underwent excision of a malignant lesion measuring 11 x 10 cm and layered closure completed on January 24, 2018 by Dr. Iran Planas.  The final pathology showed invasive squamous cell carcinoma with moderate differentiation measuring 8.8 cm with negative margins.  He did not receive any additional therapy.     Current therapy:  Libtayo 350 mg every 3 weeks started on 06/23/2018.  He is here for cycle 9 of therapy.   Interim History: [name] presents today for a follow-up.  Since the last visit, he reports no major changes in his health.  He continues to tolerate immunotherapy without any recent complications.  He denies any nausea, fatigue or changes in his bowel habits.  He continues to be active and attends to activities of daily living.  He continues to report stabilization of his axillary and inguinal adenopathy.  He eats well and continues to enjoy excellent quality of life.  He does not report any headaches, blurry vision, syncope or seizures.  He denies any confusion or lethargy.  Does not report any fevers, chills or sweats.  Does not report any cough, wheezing or hemoptysis.  Does not report any chest pain, palpitation, orthopnea or dyspnea on exertion. Does not report any nausea, vomiting or distention. Does not report any medic easy or melena.  He denies any ecchymosis or petechiae.  Does not report frequency, urgency or hematuria.  Does not report any bone pain or arthralgias.  Denies any changes in his mood.  Does not report any heat or cold intolerance.  Remaining review of systems is negative.    Medications: I have reviewed the patient's current  medications.  Current Outpatient Medications  Medication Sig Dispense Refill  . amLODipine-benazepril (LOTREL) 10-20 MG capsule TAKE 1 CAPSULE DAILY 90 capsule 4  . aspirin EC 81 MG tablet Take 81 mg by mouth daily.    . betamethasone dipropionate (DIPROLENE) 0.05 % ointment USE AS DIRECTED 45 g 5  . calcium carbonate (TUMS - DOSED IN MG ELEMENTAL CALCIUM) 500 MG chewable tablet Chew 2 tablets by mouth daily as needed for indigestion or heartburn.    . Glucosamine HCl (GLUCOSAMINE PO) Take 1 tablet by mouth daily.    Marland Kitchen levothyroxine (SYNTHROID, LEVOTHROID) 100 MCG tablet TAKE 1 TABLET DAILY 90 tablet 4  . Multiple Vitamins-Minerals (MULTIVITAMIN WITH MINERALS) tablet Take 1 tablet by mouth daily.      . traMADol (ULTRAM) 50 MG tablet Take 1 tablet (50 mg total) by mouth every 12 (twelve) hours as needed. 20 tablet 0  . vitamin E 400 UNIT capsule Take 400 Units by mouth daily.     No current facility-administered medications for this visit.    Facility-Administered Medications Ordered in Other Visits  Medication Dose Route Frequency Provider Last Rate Last Dose  . influenza  inactive virus vaccine (FLUZONE/FLUARIX) injection 0.5 mL  0.5 mL Intramuscular Once Jeffrey Lung, MD         Allergies: No Known Allergies  Past Medical History, Surgical history, Social history, and Family History remain unchanged on review.    Physical Exam:  Blood pressure (!) 146/60, pulse (!) 52, temperature 97.9 F (36.6 C), temperature source Oral,  resp. rate 18, height 5\' 8"  (1.727 m), weight 142 lb 9.6 oz (64.7 kg), SpO2 99 %.     ECOG: 1   General appearance: Alert, awake without any distress. Head: Atraumatic without abnormalities Oropharynx: Without any thrush or ulcers. Eyes: No scleral icterus. Lymph nodes:  Small palpable axillary lymph node noted on the right but unchanged.  No other adenopathy noted. Heart:regular rate and rhythm, without any murmurs or gallops.   Hardin: Clear to  auscultation without any rhonchi, wheezes or dullness to percussion. Abdomin: Soft, nontender without any shifting dullness or ascites. Musculoskeletal: No clubbing or cyanosis. Neurological: No motor or sensory deficits. Skin: No rashes or lesions.          Lab Results: Lab Results  Component Value Date   WBC 7.5 12/26/2018   HGB 13.1 12/26/2018   HCT 39.2 12/26/2018   MCV 92.2 12/26/2018   PLT 186 12/26/2018     Chemistry      Component Value Date/Time   NA 138 12/05/2018 0842   K 3.6 12/05/2018 0842   CL 109 12/05/2018 0842   CO2 19 (L) 12/05/2018 0842   BUN 15 12/05/2018 0842   CREATININE 0.82 12/05/2018 0842   CREATININE 0.63 (L) 01/05/2017 0934      Component Value Date/Time   CALCIUM 9.7 12/05/2018 0842   ALKPHOS 69 12/05/2018 0842   AST 34 12/05/2018 0842   ALT 16 12/05/2018 0842   BILITOT 0.6 12/05/2018 0842         Impression and Plan:  84 year old man with:  1.  Stage IV squamous cell carcinoma of the skin with metastatic adenopathy diagnosed in June 2019.    He continues to tolerate immunotherapy without any complications at this time.  Risks and benefits of continuing this therapy long-term was reviewed today.  Long-term complications associated with immunotherapy in general was discussed.  The plan is to continue with this therapy and repeat imaging studies in the next 1 to 2 months.  He would like to defer that option unless absolutely needed.  He is tolerating therapy well and prefers to continue rather than stop at this time.   2.  IV access: He will receive therapy via peripheral veins.  No need for Port-A-Cath at this time.  3.  Antiemetics: No issues reported with nausea or vomiting at this time.  4.  Immune related complications: I continue to reiterate immune related complications which include arthritis, colitis as well as pneumonitis.  He is not exhibiting any of these complaints.  5.  Goals of care and prognosis: Aggressive  therapy is warranted with his excellent performance status.  6.  Follow-up: In 3 weeks for his next treatment.  25  minutes was spent with the patient face-to-face today.  More than 50% of time was dedicated to reviewing his laboratory data, imaging studies and reviewing future treatment plan.     [name], MD 12/31/20199:31 AM

## 2018-12-26 NOTE — Patient Instructions (Signed)
[facility] Discharge Instructions for Patients Receiving Chemotherapy  Today you received the following chemotherapy agents Libtayo  To help prevent nausea and vomiting after your treatment, we encourage you to take your nausea medication as directed.    If you develop nausea and vomiting that is not controlled by your nausea medication, call the clinic.   BELOW ARE SYMPTOMS THAT SHOULD BE REPORTED IMMEDIATELY:  *FEVER GREATER THAN 100.5 F  *CHILLS WITH OR WITHOUT FEVER  NAUSEA AND VOMITING THAT IS NOT CONTROLLED WITH YOUR NAUSEA MEDICATION  *UNUSUAL SHORTNESS OF BREATH  *UNUSUAL BRUISING OR BLEEDING  TENDERNESS IN MOUTH AND THROAT WITH OR WITHOUT PRESENCE OF ULCERS  *URINARY PROBLEMS  *BOWEL PROBLEMS  UNUSUAL RASH Items with * indicate a potential emergency and should be followed up as soon as possible.  Feel free to call the clinic should you have any questions or concerns. The clinic phone number is [phone].  Please show the CHEMO ALERT CARD at check-in to the Emergency Department and triage nurse.   

## 2018-12-29 ENCOUNTER — Other Ambulatory Visit: Payer: Medicare Other

## 2018-12-29 ENCOUNTER — Ambulatory Visit: Payer: Medicare Other

## 2018-12-29 ENCOUNTER — Ambulatory Visit: Payer: Medicare Other | Admitting: Oncology

## 2019-01-16 ENCOUNTER — Telehealth: Payer: Self-pay | Admitting: Oncology

## 2019-01-16 ENCOUNTER — Inpatient Hospital Stay (HOSPITAL_BASED_OUTPATIENT_CLINIC_OR_DEPARTMENT_OTHER): Payer: Medicare Other | Admitting: Oncology

## 2019-01-16 ENCOUNTER — Inpatient Hospital Stay: Payer: Medicare Other | Attending: Oncology

## 2019-01-16 ENCOUNTER — Inpatient Hospital Stay: Payer: Medicare Other

## 2019-01-16 VITALS — BP 146/56 | HR 53 | Temp 97.7°F | Resp 17 | Wt 139.6 lb

## 2019-01-16 DIAGNOSIS — C4492 Squamous cell carcinoma of skin, unspecified: Secondary | ICD-10-CM

## 2019-01-16 DIAGNOSIS — C44509 Unspecified malignant neoplasm of skin of other part of trunk: Secondary | ICD-10-CM

## 2019-01-16 DIAGNOSIS — R634 Abnormal weight loss: Secondary | ICD-10-CM

## 2019-01-16 DIAGNOSIS — Z79899 Other long term (current) drug therapy: Secondary | ICD-10-CM | POA: Insufficient documentation

## 2019-01-16 DIAGNOSIS — R530 Neoplastic (malignant) related fatigue: Secondary | ICD-10-CM

## 2019-01-16 DIAGNOSIS — C44599 Other specified malignant neoplasm of skin of other part of trunk: Secondary | ICD-10-CM

## 2019-01-16 DIAGNOSIS — R630 Anorexia: Secondary | ICD-10-CM

## 2019-01-16 DIAGNOSIS — Z5112 Encounter for antineoplastic immunotherapy: Secondary | ICD-10-CM | POA: Diagnosis not present

## 2019-01-16 DIAGNOSIS — C778 Secondary and unspecified malignant neoplasm of lymph nodes of multiple regions: Secondary | ICD-10-CM | POA: Insufficient documentation

## 2019-01-16 DIAGNOSIS — R599 Enlarged lymph nodes, unspecified: Secondary | ICD-10-CM

## 2019-01-16 LAB — CMP (CANCER CENTER ONLY)
ALT: 11 U/L (ref 0–44)
AST: 19 U/L (ref 15–41)
Albumin: 3.2 g/dL — ABNORMAL LOW (ref 3.5–5.0)
Alkaline Phosphatase: 80 U/L (ref 38–126)
Anion gap: 12 (ref 5–15)
BUN: 11 mg/dL (ref 8–23)
CO2: 17 mmol/L — ABNORMAL LOW (ref 22–32)
Calcium: 10.4 mg/dL — ABNORMAL HIGH (ref 8.9–10.3)
Chloride: 107 mmol/L (ref 98–111)
Creatinine: 0.72 mg/dL (ref 0.61–1.24)
GFR, Estimated: >60 mL/min (ref >60)
Glucose, Bld: 87 mg/dL (ref 70–99)
POTASSIUM: 3.7 mmol/L (ref 3.5–5.1)
Sodium: 136 mmol/L (ref 135–145)
Total Bilirubin: 0.5 mg/dL (ref 0.3–1.2)
Total Protein: 7.1 g/dL (ref 6.5–8.1)

## 2019-01-16 LAB — CBC WITH DIFFERENTIAL (CANCER CENTER ONLY)
ABS IMMATURE GRANULOCYTES: 0.02 10*3/uL (ref 0.00–0.07)
Basophils Absolute: 0.1 10*3/uL (ref 0.0–0.1)
Basophils Relative: 1 %
Eosinophils Absolute: 0.3 10*3/uL (ref 0.0–0.5)
Eosinophils Relative: 4 %
HCT: 40.8 % (ref 39.0–52.0)
Hemoglobin: 13.8 g/dL (ref 13.0–17.0)
Immature Granulocytes: 0 %
Lymphocytes Relative: 9 %
Lymphs Abs: 0.7 10*3/uL (ref 0.7–4.0)
MCH: 31.4 pg (ref 26.0–34.0)
MCHC: 33.8 g/dL (ref 30.0–36.0)
MCV: 92.7 fL (ref 80.0–100.0)
Monocytes Absolute: 0.7 10*3/uL (ref 0.1–1.0)
Monocytes Relative: 8 %
NEUTROS ABS: 6.8 10*3/uL (ref 1.7–7.7)
NRBC: 0 % (ref 0.0–0.2)
Neutrophils Relative %: 78 %
Platelet Count: 332 10*3/uL (ref 150–400)
RBC: 4.4 MIL/uL (ref 4.22–5.81)
RDW: 13.6 % (ref 11.5–15.5)
WBC Count: 8.6 10*3/uL (ref 4.0–10.5)

## 2019-01-16 LAB — TSH: TSH: 1.067 u[IU]/mL (ref 0.320–4.118)

## 2019-01-16 MED ORDER — SODIUM CHLORIDE 0.9 % IV SOLN
Freq: Once | INTRAVENOUS | Status: AC
  Administered 2019-01-16: 10:00:00 via INTRAVENOUS
  Filled 2019-01-16: qty 250

## 2019-01-16 MED ORDER — SODIUM CHLORIDE 0.9 % IV SOLN
350.0000 mg | Freq: Once | INTRAVENOUS | Status: AC
  Administered 2019-01-16: 350 mg via INTRAVENOUS
  Filled 2019-01-16: qty 7

## 2019-01-16 NOTE — Progress Notes (Signed)
Hematology and Oncology Follow Up Visit  Jeffrey Hardin [id number] 1934/06/08 85 y.o. 01/16/2019 8:53 AM [name], MDLalonde, [name], MD   Principle Diagnosis: 85 year old man with advanced squamous cell carcinoma of the skin with disease involving axillary and inguinal adenopathy diagnosed in November 2018.     Prior Therapy: He underwent excision of a malignant lesion measuring 11 x 10 cm and layered closure completed on January 24, 2018 by Dr. Iran Planas.  The final pathology showed invasive squamous cell carcinoma with moderate differentiation measuring 8.8 cm with negative margins.  He did not receive any additional therapy.     Current therapy:  Libtayo 350 mg every 3 weeks started on 06/23/2018.  He is here for cycle 10 of therapy.   Interim History: Jeffrey Hardin returns today for repeat evaluation.  Since the last visit, he started experiencing more side effects associated with this therapy.  He had reported some mild anorexia and fatigue he has also reported some mild weight loss.  He denies any worsening adenopathy but did report some drainage out of his right axillary lymph node.  No enlargements noted.  He continues to attend activities of daily living without any decline however.  He denies any respiratory symptoms.  He does report constipation but no diarrhea.  He does not report any headaches, blurry vision, syncope or seizures.  He denies any changes in his mentation or lethargy.  Does not report any fevers, chills or sweats.  Does not report any cough, wheezing or hemoptysis.  Does not report any chest pain, palpitation, orthopnea or dyspnea on exertion. Does not report any nausea, vomiting or early satiety.  Does not report any hematochezia or melena.  He denies any bleeding or clotting tendency.  Does not report frequency, urgency or hematuria.  Does not report any arthralgias or myalgias.  Denies any anxiety or depression. Remaining review of systems is negative.     Medications: I have reviewed the patient's current medications.  Current Outpatient Medications  Medication Sig Dispense Refill  . amLODipine-benazepril (LOTREL) 10-20 MG capsule TAKE 1 CAPSULE DAILY 90 capsule 4  . aspirin EC 81 MG tablet Take 81 mg by mouth daily.    . betamethasone dipropionate (DIPROLENE) 0.05 % ointment USE AS DIRECTED 45 g 5  . calcium carbonate (TUMS - DOSED IN MG ELEMENTAL CALCIUM) 500 MG chewable tablet Chew 2 tablets by mouth daily as needed for indigestion or heartburn.    . Glucosamine HCl (GLUCOSAMINE PO) Take 1 tablet by mouth daily.    Marland Kitchen levothyroxine (SYNTHROID, LEVOTHROID) 100 MCG tablet TAKE 1 TABLET DAILY 90 tablet 4  . Multiple Vitamins-Minerals (MULTIVITAMIN WITH MINERALS) tablet Take 1 tablet by mouth daily.      . traMADol (ULTRAM) 50 MG tablet Take 1 tablet (50 mg total) by mouth every 12 (twelve) hours as needed. 20 tablet 0  . vitamin E 400 UNIT capsule Take 400 Units by mouth daily.     No current facility-administered medications for this visit.    Facility-Administered Medications Ordered in Other Visits  Medication Dose Route Frequency Provider Last Rate Last Dose  . influenza  inactive virus vaccine (FLUZONE/FLUARIX) injection 0.5 mL  0.5 mL Intramuscular Once [name], MD         Allergies: No Known Allergies  Past Medical History, Surgical history, Social history, and Family History remain unchanged on review.    Physical Exam:  Blood pressure (!) 146/56, pulse (!) 53, temperature 97.7 F (36.5 C), temperature  source Oral, resp. rate 17, weight 139 lb 9 oz (63.3 kg), SpO2 100 %.     ECOG: 1   General appearance: Comfortable appearing without any discomfort Head: Normocephalic without any trauma Oropharynx: Mucous membranes are moist and pink without any thrush or ulcers. Eyes: Pupils are equal and round reactive to light. Lymph nodes:  Palpable right axillary lymph node with mild erythema drainage.  No other  palpable adenopathy noted. Heart:regular rate and rhythm.  S1 and S2 without leg edema. Lung: Clear without any rhonchi or wheezes.  No dullness to percussion. Abdomin: Soft, nontender, nondistended with good bowel sounds.  No hepatosplenomegaly. Musculoskeletal: No joint deformity or effusion.  Full range of motion noted. Neurological: No deficits noted on motor, sensory and deep tendon reflex exam. Skin: No petechial rash or dryness.  Appeared moist.            Lab Results: Lab Results  Component Value Date   WBC 8.6 01/16/2019   HGB 13.8 01/16/2019   HCT 40.8 01/16/2019   MCV 92.7 01/16/2019   PLT 332 01/16/2019     Chemistry      Component Value Date/Time   NA 137 12/26/2018 0900   K 3.7 12/26/2018 0900   CL 109 12/26/2018 0900   CO2 19 (L) 12/26/2018 0900   BUN 11 12/26/2018 0900   CREATININE 0.74 12/26/2018 0900   CREATININE 0.63 (L) 01/05/2017 0934      Component Value Date/Time   CALCIUM 10.0 12/26/2018 0900   ALKPHOS 71 12/26/2018 0900   AST 21 12/26/2018 0900   ALT 13 12/26/2018 0900   BILITOT 0.5 12/26/2018 0900         Impression and Plan:  85 year old man with:  1.  Squamous cell carcinoma of the skin with lymphadenopathy diagnosed in June 2019.    He continues to be on Libtayo 350 mg with a reasonable response clinically.  He is developing more side effects including anorexia and weight loss.  The natural course of this disease was reviewed and treatment options were discussed today.  I have recommended proceeding with treatment today and repeat imaging studies in 3 weeks.  I favor a treatment holiday after that depending on the results of the CT scan.  2.  IV access: Peripheral veins remain adequate at this time.  No need for Port-A-Cath.  3.  Antiemetics: No issues with nausea and vomiting at this time.  4.  Immune related complications: Long-term complications associated with immunotherapy were reiterated.  His issues including colitis,  pneumonitis and others.  Does report some fatigue and anorexia but otherwise no other complications.  5.  Goals of care and prognosis: Therapy remains palliative at this time and his disease is incurable.  Aggressive therapy is warranted given his excellent performance status.  6.  Follow-up: In 3 weeks to follow his status.   25  minutes was spent with the patient face-to-face today.  More than 50% of time was dedicated to discussing his disease status, treatment options and coordinating plan of care.     [name], MD 1/21/20208:53 AM

## 2019-01-16 NOTE — Telephone Encounter (Signed)
Scheduled appts 01/21 los.  Patient aware of CT scan appt, gave patient instructions and contrast,  Printed calendar and avs.

## 2019-01-16 NOTE — Patient Instructions (Signed)
[facility] Discharge Instructions for Patients Receiving Chemotherapy  Today you received the following chemotherapy agents Libtayo  To help prevent nausea and vomiting after your treatment, we encourage you to take your nausea medication as directed.    If you develop nausea and vomiting that is not controlled by your nausea medication, call the clinic.   BELOW ARE SYMPTOMS THAT SHOULD BE REPORTED IMMEDIATELY:  *FEVER GREATER THAN 100.5 F  *CHILLS WITH OR WITHOUT FEVER  NAUSEA AND VOMITING THAT IS NOT CONTROLLED WITH YOUR NAUSEA MEDICATION  *UNUSUAL SHORTNESS OF BREATH  *UNUSUAL BRUISING OR BLEEDING  TENDERNESS IN MOUTH AND THROAT WITH OR WITHOUT PRESENCE OF ULCERS  *URINARY PROBLEMS  *BOWEL PROBLEMS  UNUSUAL RASH Items with * indicate a potential emergency and should be followed up as soon as possible.  Feel free to call the clinic should you have any questions or concerns. The clinic phone number is [phone].  Please show the CHEMO ALERT CARD at check-in to the Emergency Department and triage nurse.   

## 2019-02-02 ENCOUNTER — Encounter (HOSPITAL_COMMUNITY): Payer: Self-pay

## 2019-02-02 ENCOUNTER — Ambulatory Visit (HOSPITAL_COMMUNITY)
Admission: RE | Admit: 2019-02-02 | Discharge: 2019-02-02 | Disposition: A | Discharge status: Home / Self Care | Payer: Medicare Other | Source: Ambulatory Visit | Attending: Oncology | Admitting: Oncology

## 2019-02-02 DIAGNOSIS — C773 Secondary and unspecified malignant neoplasm of axilla and upper limb lymph nodes: Secondary | ICD-10-CM | POA: Diagnosis not present

## 2019-02-02 DIAGNOSIS — R599 Enlarged lymph nodes, unspecified: Secondary | ICD-10-CM | POA: Diagnosis not present

## 2019-02-02 DIAGNOSIS — C4492 Squamous cell carcinoma of skin, unspecified: Secondary | ICD-10-CM | POA: Diagnosis not present

## 2019-02-02 MED ORDER — IOHEXOL 300 MG/ML  SOLN
100.0000 mL | Freq: Once | INTRAMUSCULAR | Status: AC | PRN
  Administered 2019-02-02: 100 mL via INTRAVENOUS

## 2019-02-02 MED ORDER — SODIUM CHLORIDE (PF) 0.9 % IJ SOLN
INTRAMUSCULAR | Status: AC
  Filled 2019-02-02: qty 50

## 2019-02-06 ENCOUNTER — Inpatient Hospital Stay: Payer: Medicare Other

## 2019-02-06 ENCOUNTER — Telehealth: Payer: Self-pay

## 2019-02-06 ENCOUNTER — Inpatient Hospital Stay (HOSPITAL_BASED_OUTPATIENT_CLINIC_OR_DEPARTMENT_OTHER): Payer: Medicare Other | Admitting: Oncology

## 2019-02-06 ENCOUNTER — Inpatient Hospital Stay: Payer: Medicare Other | Attending: Oncology

## 2019-02-06 VITALS — BP 155/65 | HR 57 | Temp 98.5°F | Resp 18 | Ht 68.0 in | Wt 145.4 lb

## 2019-02-06 DIAGNOSIS — E079 Disorder of thyroid, unspecified: Secondary | ICD-10-CM | POA: Diagnosis not present

## 2019-02-06 DIAGNOSIS — C4492 Squamous cell carcinoma of skin, unspecified: Secondary | ICD-10-CM

## 2019-02-06 DIAGNOSIS — C773 Secondary and unspecified malignant neoplasm of axilla and upper limb lymph nodes: Secondary | ICD-10-CM

## 2019-02-06 DIAGNOSIS — Z7982 Long term (current) use of aspirin: Secondary | ICD-10-CM

## 2019-02-06 DIAGNOSIS — Z5112 Encounter for antineoplastic immunotherapy: Secondary | ICD-10-CM | POA: Diagnosis not present

## 2019-02-06 DIAGNOSIS — Z79899 Other long term (current) drug therapy: Secondary | ICD-10-CM

## 2019-02-06 DIAGNOSIS — C44509 Unspecified malignant neoplasm of skin of other part of trunk: Secondary | ICD-10-CM

## 2019-02-06 DIAGNOSIS — C44599 Other specified malignant neoplasm of skin of other part of trunk: Secondary | ICD-10-CM

## 2019-02-06 LAB — CBC WITH DIFFERENTIAL (CANCER CENTER ONLY)
Abs Immature Granulocytes: 0.03 10*3/uL (ref 0.00–0.07)
Basophils Absolute: 0 10*3/uL (ref 0.0–0.1)
Basophils Relative: 0 %
Eosinophils Absolute: 0.2 10*3/uL (ref 0.0–0.5)
Eosinophils Relative: 2 %
HCT: 40.2 % (ref 39.0–52.0)
Hemoglobin: 13.5 g/dL (ref 13.0–17.0)
Immature Granulocytes: 0 %
Lymphocytes Relative: 9 %
Lymphs Abs: 0.9 10*3/uL (ref 0.7–4.0)
MCH: 31.2 pg (ref 26.0–34.0)
MCHC: 33.6 g/dL (ref 30.0–36.0)
MCV: 92.8 fL (ref 80.0–100.0)
Monocytes Absolute: 0.8 10*3/uL (ref 0.1–1.0)
Monocytes Relative: 9 %
NEUTROS PCT: 80 %
Neutro Abs: 7.7 10*3/uL (ref 1.7–7.7)
Platelet Count: 303 10*3/uL (ref 150–400)
RBC: 4.33 MIL/uL (ref 4.22–5.81)
RDW: 13.3 % (ref 11.5–15.5)
WBC Count: 9.6 10*3/uL (ref 4.0–10.5)
nRBC: 0 % (ref 0.0–0.2)

## 2019-02-06 LAB — CMP (CANCER CENTER ONLY)
ALBUMIN: 3 g/dL — AB (ref 3.5–5.0)
ALT: 9 U/L (ref 0–44)
AST: 18 U/L (ref 15–41)
Alkaline Phosphatase: 68 U/L (ref 38–126)
Anion gap: 7 (ref 5–15)
BILIRUBIN TOTAL: 0.7 mg/dL (ref 0.3–1.2)
BUN: 11 mg/dL (ref 8–23)
CO2: 20 mmol/L — ABNORMAL LOW (ref 22–32)
Calcium: 10 mg/dL (ref 8.9–10.3)
Chloride: 110 mmol/L (ref 98–111)
Creatinine: 0.74 mg/dL (ref 0.61–1.24)
GFR, Est AFR Am: >60 mL/min (ref >60)
GFR, Estimated: >60 mL/min (ref >60)
GLUCOSE: 109 mg/dL — AB (ref 70–99)
Potassium: 3.5 mmol/L (ref 3.5–5.1)
Sodium: 137 mmol/L (ref 135–145)
Total Protein: 6.9 g/dL (ref 6.5–8.1)

## 2019-02-06 MED ORDER — SODIUM CHLORIDE 0.9 % IV SOLN
Freq: Once | INTRAVENOUS | Status: AC
  Administered 2019-02-06: 11:00:00 via INTRAVENOUS
  Filled 2019-02-06: qty 250

## 2019-02-06 MED ORDER — SODIUM CHLORIDE 0.9 % IV SOLN
350.0000 mg | Freq: Once | INTRAVENOUS | Status: AC
  Administered 2019-02-06: 350 mg via INTRAVENOUS
  Filled 2019-02-06: qty 7

## 2019-02-06 NOTE — Progress Notes (Signed)
Hematology and Oncology Follow Up Visit  Jeffrey Hardin [id number] 10-20-1934 85 y.o. 02/06/2019 9:40 AM Jeffrey Hardin, MDLalonde, Jeffrey Jarvis, MD   Principle Diagnosis: 85 year old man with squamous cell carcinoma of the skin presented with advanced in November 2018.  He has axillary and inguinal adenopathy.   Prior Therapy: He underwent excision of a malignant lesion measuring 11 x 10 cm and layered closure completed on January 24, 2018 by Dr. Iran Hardin.  The final pathology showed invasive squamous cell carcinoma with moderate differentiation measuring 8.8 cm with negative margins.      Current therapy:  Libtayo 350 mg every 3 weeks started on 06/23/2018.  He is status post 10 cycles of therapy.   Interim History: Jeffrey Hardin is here for a follow-up visit.  Since the last visit, he reports no major changes in his health.  He continues to tolerate this therapy without any recent complaints.  His appetite has improved and his weight is up at this time.  He is ability to taste food has improved since the last visit.  He denies any recent hospitalizations or illnesses.  He denies any changes in his bowel habits.  His performance status and quality of life remain excellent.  He is reporting more discomfort in his right arm and enlargement in his tumor under the right axilla.  Patient denied any alteration mental status, neuropathy, confusion or dizziness.  Denies any headaches or lethargy.  Denies any night sweats, weight loss or changes in appetite.  Denied orthopnea, dyspnea on exertion or chest discomfort.  Denies shortness of breath, difficulty breathing hemoptysis or cough.  Denies any abdominal distention, nausea, early satiety or dyspepsia.  Denies any hematuria, frequency, dysuria or nocturia.  Denies any skin irritation, dryness or rash.  Denies any ecchymosis or petechiae.  Denies any lymphadenopathy or clotting.  Denies any heat or cold intolerance.  Denies any anxiety or depression.   Remaining review of system is negative.    Medications: I have reviewed the patient's current medications.  Current Outpatient Medications  Medication Sig Dispense Refill  . amLODipine-benazepril (LOTREL) 10-20 MG capsule TAKE 1 CAPSULE DAILY 90 capsule 4  . aspirin EC 81 MG tablet Take 81 mg by mouth daily.    . betamethasone dipropionate (DIPROLENE) 0.05 % ointment USE AS DIRECTED 45 g 5  . calcium carbonate (TUMS - DOSED IN MG ELEMENTAL CALCIUM) 500 MG chewable tablet Chew 2 tablets by mouth daily as needed for indigestion or heartburn.    . Glucosamine HCl (GLUCOSAMINE PO) Take 1 tablet by mouth daily.    Marland Kitchen levothyroxine (SYNTHROID, LEVOTHROID) 100 MCG tablet TAKE 1 TABLET DAILY 90 tablet 4  . Multiple Vitamins-Minerals (MULTIVITAMIN WITH MINERALS) tablet Take 1 tablet by mouth daily.      . vitamin E 400 UNIT capsule Take 400 Units by mouth daily.     No current facility-administered medications for this visit.    Facility-Administered Medications Ordered in Other Visits  Medication Dose Route Frequency Provider Last Rate Last Dose  . influenza  inactive virus vaccine (FLUZONE/FLUARIX) injection 0.5 mL  0.5 mL Intramuscular Once Jeffrey Lung, MD         Allergies: No Known Allergies  Past Medical History, Surgical history, Social history, and Family History remain unchanged on review.    Physical Exam:  Blood pressure (!) 155/65, pulse (!) 57, temperature 98.5 F (36.9 C), temperature source Oral, resp. rate 18, height 5\' 8"  (1.727 m), weight 145 lb 6.4 oz (66 kg),  SpO2 100 %.     ECOG: 1   General appearance: Alert, awake without any distress. Head: Atraumatic without abnormalities Oropharynx: Without any thrush or ulcers. Eyes: No scleral icterus. Lymph nodes: Enlarging right axillary mass palpated. Heart:regular rate and rhythm, without any murmurs or gallops.   Hardin: Clear to auscultation without any rhonchi, wheezes or dullness to percussion. Abdomin:  Soft, nontender without any shifting dullness or ascites. Musculoskeletal: No clubbing or cyanosis. Neurological: No motor or sensory deficits. Skin: No rashes or lesions.            Lab Results: Lab Results  Component Value Date   WBC 8.6 01/16/2019   HGB 13.8 01/16/2019   HCT 40.8 01/16/2019   MCV 92.7 01/16/2019   PLT 332 01/16/2019     Chemistry      Component Value Date/Time   NA 136 01/16/2019 0821   K 3.7 01/16/2019 0821   CL 107 01/16/2019 0821   CO2 17 (L) 01/16/2019 0821   BUN 11 01/16/2019 0821   CREATININE 0.72 01/16/2019 0821   CREATININE 0.63 (L) 01/05/2017 0934      Component Value Date/Time   CALCIUM 10.4 (H) 01/16/2019 0821   ALKPHOS 80 01/16/2019 0821   AST 19 01/16/2019 0821   ALT 11 01/16/2019 0821   BILITOT 0.5 01/16/2019 0821      EXAM: CT CHEST, ABDOMEN, AND PELVIS WITH CONTRAST  TECHNIQUE: Multidetector CT imaging of the chest, abdomen and pelvis was performed following the standard protocol during bolus administration of intravenous contrast.  CONTRAST:  147mL OMNIPAQUE IOHEXOL 300 MG/ML  SOLN  COMPARISON:  10/16/2018  FINDINGS: CT CHEST FINDINGS  Cardiovascular: The heart is normal in size. No pericardial effusion.  No evidence of thoracic aortic aneurysm. Atherosclerotic calcifications of the aortic arch.  Coronary atherosclerosis the LAD and right coronary artery.  Mediastinum/Nodes: No suspicious mediastinal, hilar, or left axillary lymphadenopathy.  Large right axillary node measuring 6.6 cm short axis (series 2/image 19), previously 3.4 cm.  Lungs/Pleura: No suspicious pulmonary nodules. Stable nodular scarring in the bilateral lower lobes (series 4/images 113-114).  Mild linear scarring in the right lower lobe.  Mild subpleural reticulation in the bilateral lower lobes.  No focal consolidation.  No pleural effusion or pneumothorax.  Musculoskeletal: Mild degenerative changes of the  thoracic spine.  CT ABDOMEN PELVIS FINDINGS  Hepatobiliary: Liver is within normal limits.  Gallbladder is unremarkable. No intrahepatic or extrahepatic ductal dilatation.  Pancreas: Within normal limits.  Spleen: Within normal limits.  Adrenals/Urinary Tract: Adrenal glands are within normal limits.  Medial left upper pole renal cysts measuring up to 5.2 cm (series 2/image 66), unchanged. Right kidney is within normal limits. No hydronephrosis.  Thick-walled bladder with trabeculation and a right posterolateral bladder diverticulum.  Stomach/Bowel: Stomach is notable for a tiny hiatal hernia.  No evidence of bowel obstruction.  Normal appendix (series 2/image 86).  Extensive left colonic diverticulosis with mucosal hypertrophy involving the sigmoid colon. No evidence of diverticulitis.  Vascular/Lymphatic: No evidence of abdominal aortic aneurysm.  Atherosclerotic calcifications of the abdominal aorta and branch vessels.  No suspicious abdominopelvic lymphadenopathy.  Reproductive: Prostate is grossly unremarkable.  Other: No abdominopelvic ascites.  Postsurgical changes in the right inguinal region.  Musculoskeletal: Status post left lower extremity amputation at the hip.  Visualized osseous structures are otherwise within normal limits.  IMPRESSION: Large right axillary nodal metastasis measuring 6.6 cm short axis, progressive.  No evidence of metastatic disease in the abdomen/pelvis. Postsurgical changes in the right  inguinal region.  Additional stable ancillary findings as above.    Impression and Plan:  85 year old man with:  1.  Advanced cutaneous squamous cell carcinoma diagnosed in June 2019.  He presented with inguinal and axillary adenopathy.  He is status post 10 cycles of Libtayo 350 mg with excellent response to therapy and improvement in his clinical status.  Imaging studies obtained on February 02, 2019 showed  overall complete response to therapy in the right abdomen and pelvis including external iliac lymph nodes.  He continues to have right axillary lymph node that has persisted and slightly increased in size.  Treatment options were reviewed today which include continuing systemic therapy versus evaluation for possible surgical therapy and the axillary lymph node dissection given his isolated disease.  After discussion today, he is agreeable to proceed with a surgical evaluation in the immediate future.  In the meantime we will continue with Libtayo.  We will refer him to Dr. Barry Dienes for evaluation.    2.  IV access: Peripheral veins continues to be in use without any reported issues.  3.  Antiemetics: No nausea or vomiting reported at this time.  4.  Immune related complications: These complications were reiterated today which include arthritis, pneumonitis, colitis and thyroid disease.  5.  Goals of care and prognosis: He had an excellent response to therapy treatment mostly palliative at this time.  His performance status remain excellent and aggressive therapy is warranted.  6.  Follow-up: In March 2020 for repeat evaluation.  25  minutes was spent with the patient face-to-face today.  More than 50% of time was dedicated to reviewing imaging studies, laboratory data, treatment options and assessing complications related therapy.     [name], MD 2/11/20209:40 AM

## 2019-02-06 NOTE — Patient Instructions (Signed)
[facility] Discharge Instructions for Patients Receiving Chemotherapy  Today you received the following chemotherapy agents Libtayo  To help prevent nausea and vomiting after your treatment, we encourage you to take your nausea medication as directed.    If you develop nausea and vomiting that is not controlled by your nausea medication, call the clinic.   BELOW ARE SYMPTOMS THAT SHOULD BE REPORTED IMMEDIATELY:  *FEVER GREATER THAN 100.5 F  *CHILLS WITH OR WITHOUT FEVER  NAUSEA AND VOMITING THAT IS NOT CONTROLLED WITH YOUR NAUSEA MEDICATION  *UNUSUAL SHORTNESS OF BREATH  *UNUSUAL BRUISING OR BLEEDING  TENDERNESS IN MOUTH AND THROAT WITH OR WITHOUT PRESENCE OF ULCERS  *URINARY PROBLEMS  *BOWEL PROBLEMS  UNUSUAL RASH Items with * indicate a potential emergency and should be followed up as soon as possible.  Feel free to call the clinic should you have any questions or concerns. The clinic phone number is [phone].  Please show the CHEMO ALERT CARD at check-in to the Emergency Department and triage nurse.   

## 2019-02-06 NOTE — Telephone Encounter (Signed)
Printed avs and calender of upcoming appointment. Per 2/11 los (ref. Entered)

## 2019-02-07 ENCOUNTER — Telehealth: Payer: Self-pay

## 2019-02-07 IMAGING — CT CT ABD-PELV W/ CM
2 of 5 series · 12 of 36 positions shown, 15 images · IV contrast (OMNIPAQUE)
Comparison: 10/16/2018

CLINICAL DATA: Advanced squamous cell carcinoma of the skin with
axillary and inguinal lymphadenopathy

EXAM:
CT CHEST, ABDOMEN, AND PELVIS WITH CONTRAST
TECHNIQUE: Multidetector CT imaging of the chest, abdomen and pelvis was
performed following the standard protocol during bolus
administration of intravenous contrast.
CONTRAST:  100mL OMNIPAQUE IOHEXOL 300 MG/ML  SOLN

[Series 2: cap with · axial · 0.73mm/px · z∈[-612,-57]mm · 9 of 139 slices shown, 12 images]
[im 14/139  mediastinal]
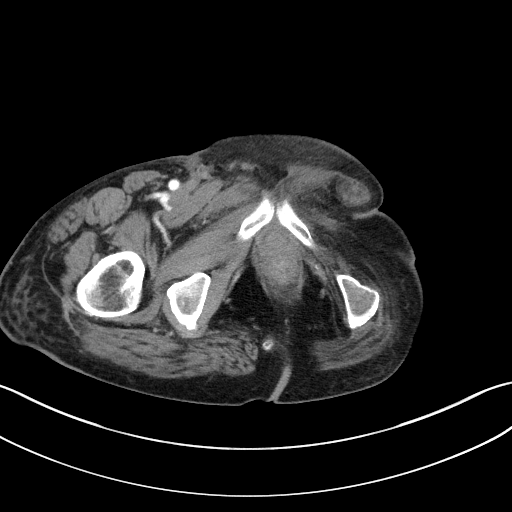
[im 14/139  lung]
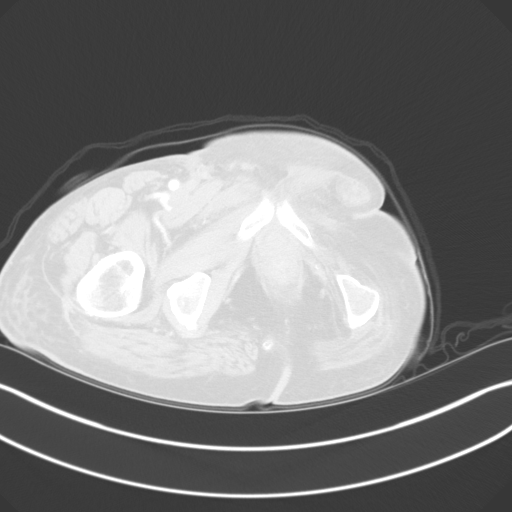
[im 28/139  lung]
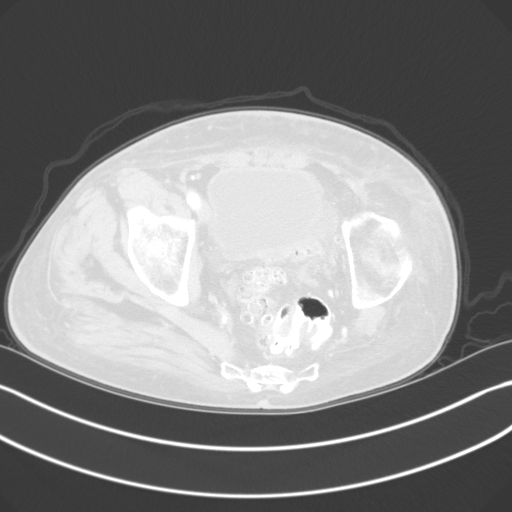
[im 42/139  lung]
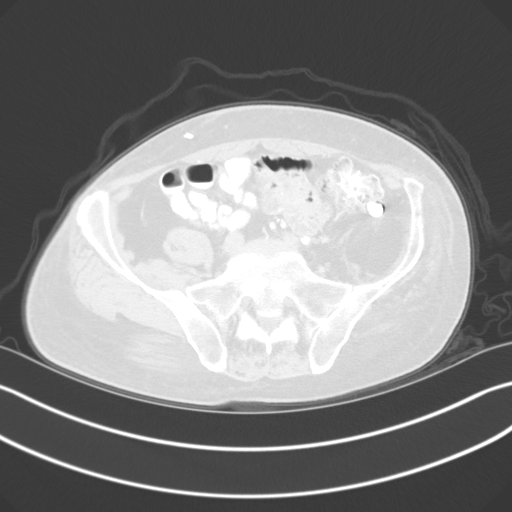
[im 56/139  lung]
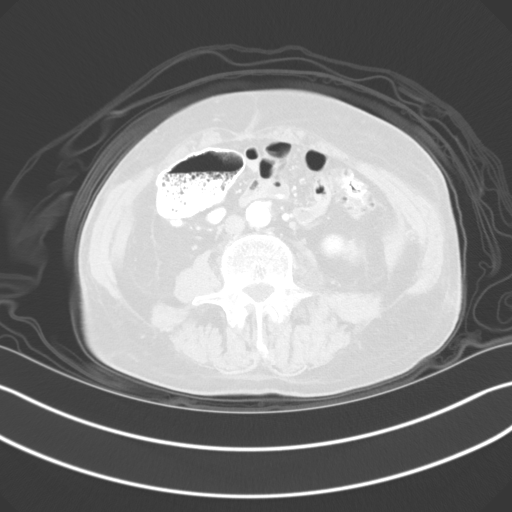
[im 70/139  mediastinal]
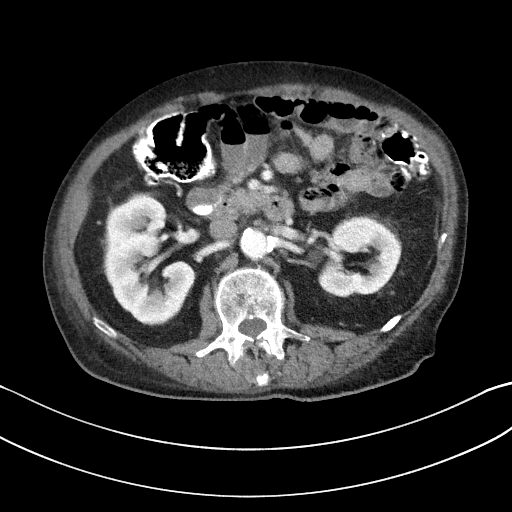
[im 70/139  lung]
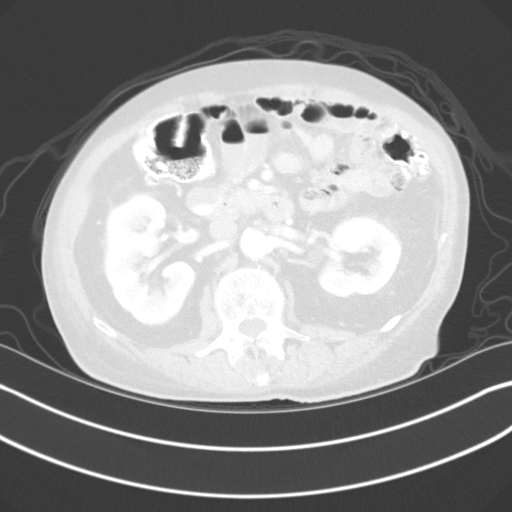
[im 83/139  lung]
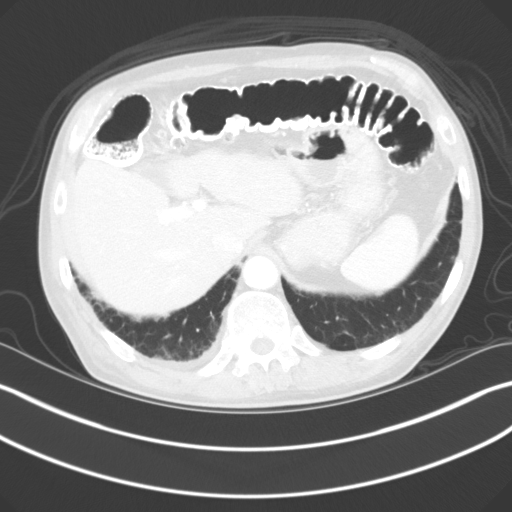
[im 97/139  lung]
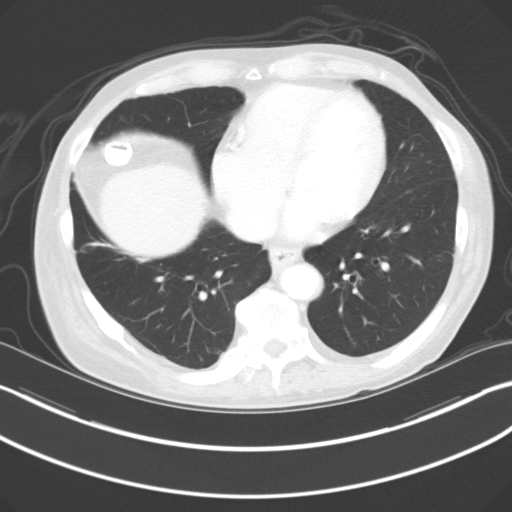
[im 111/139  lung]
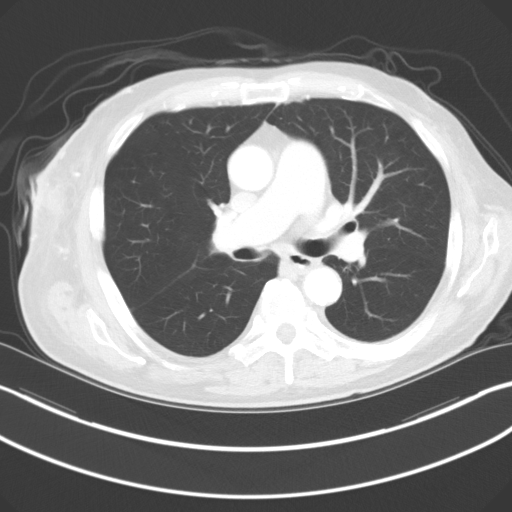
[im 125/139  mediastinal]
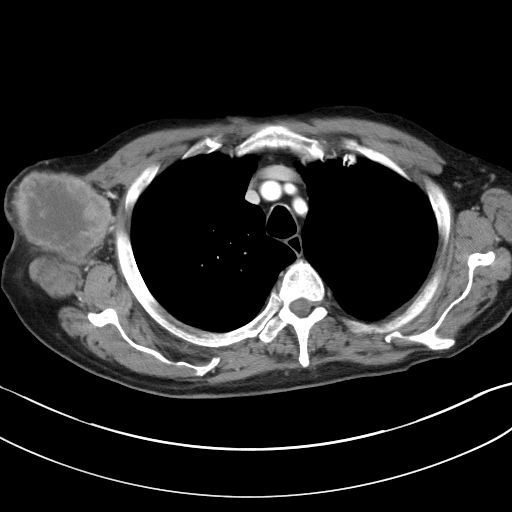
[im 125/139  lung]
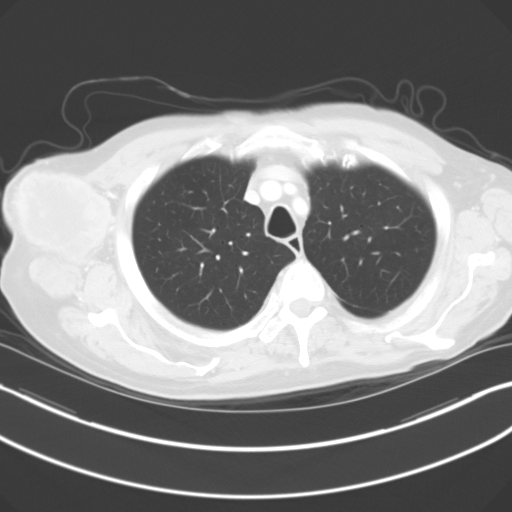

[Series 5: coronals · coronal · 0.78mm/px · 3 of 167 slices shown]
[im 34/167  lung]
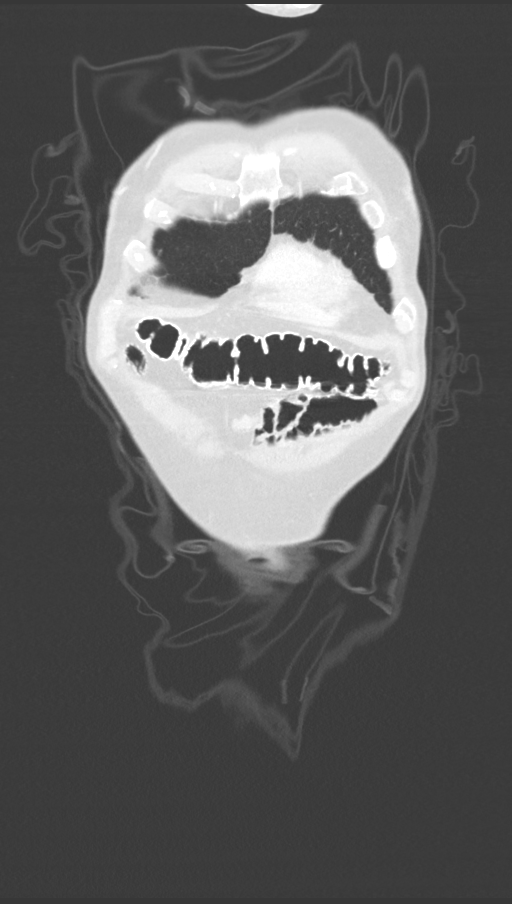
[im 67/167  lung]
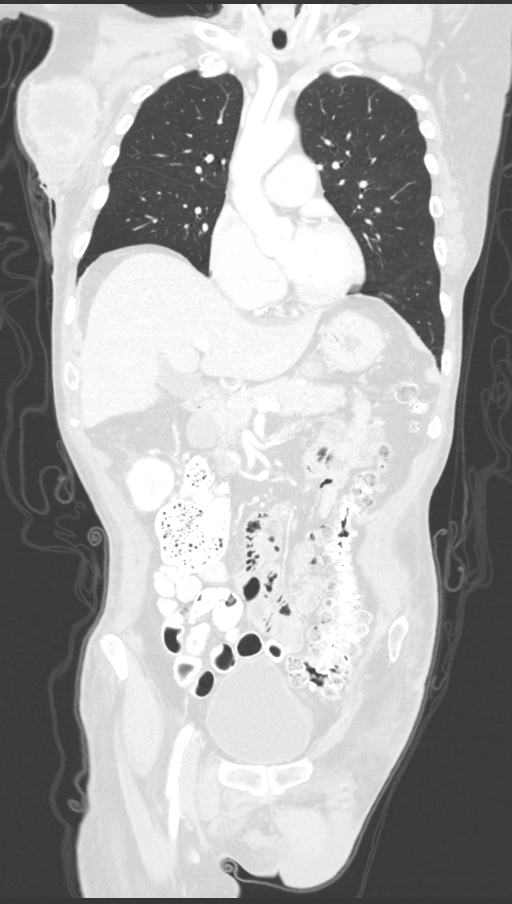
[im 100/167  lung]
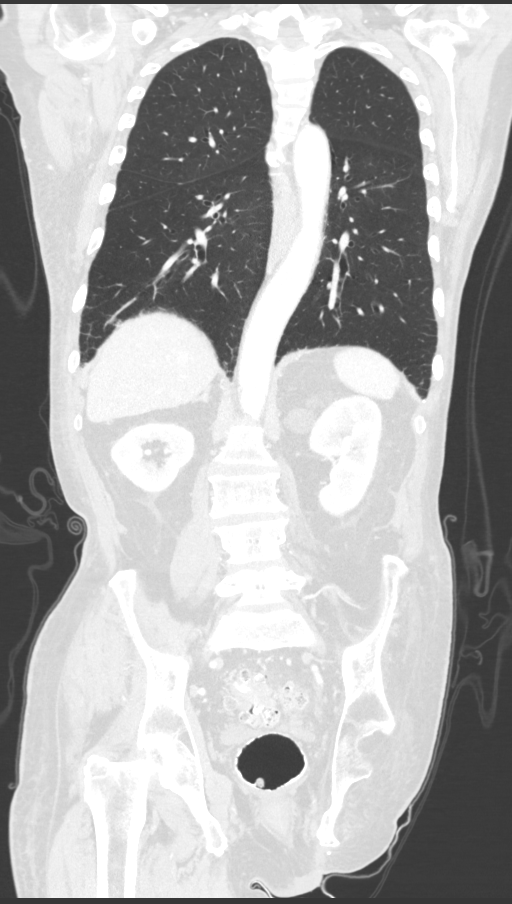

[12 of 36 positions shown; findings below may reference images not displayed]

FINDINGS: CT CHEST FINDINGS

Cardiovascular: The heart is normal in size. No pericardial
effusion.

No evidence of thoracic aortic aneurysm. Atherosclerotic
calcifications of the aortic arch.

Coronary atherosclerosis the LAD and right coronary artery.

Mediastinum/Nodes: No suspicious mediastinal, hilar, or left
axillary lymphadenopathy.

Large right axillary node measuring 6.6 cm short axis (series
2/image 19), previously 3.4 cm.

Lungs/Pleura: No suspicious pulmonary nodules. Stable nodular
scarring in the bilateral lower lobes (series 4/images 113-114).

Mild linear scarring in the right lower lobe.

Mild subpleural reticulation in the bilateral lower lobes.

No focal consolidation.

No pleural effusion or pneumothorax.

Musculoskeletal: Mild degenerative changes of the thoracic spine.

CT ABDOMEN PELVIS FINDINGS

Hepatobiliary: Liver is within normal limits.

Gallbladder is unremarkable. No intrahepatic or extrahepatic ductal
dilatation.

Pancreas: Within normal limits.

Spleen: Within normal limits.

Adrenals/Urinary Tract: Adrenal glands are within normal limits.

Medial left upper pole renal cysts measuring up to 5.2 cm (series
2/image 66), unchanged. Right kidney is within normal limits. No
hydronephrosis.

Thick-walled bladder with trabeculation and a right posterolateral
bladder diverticulum.

Stomach/Bowel: Stomach is notable for a tiny hiatal hernia.

No evidence of bowel obstruction.

Normal appendix (series 2/image 86).

Extensive left colonic diverticulosis with mucosal hypertrophy
involving the sigmoid colon. No evidence of diverticulitis.

Vascular/Lymphatic: No evidence of abdominal aortic aneurysm.

Atherosclerotic calcifications of the abdominal aorta and branch
vessels.

No suspicious abdominopelvic lymphadenopathy.

Reproductive: Prostate is grossly unremarkable.

Other: No abdominopelvic ascites.

Postsurgical changes in the right inguinal region.

Musculoskeletal: Status post left lower extremity amputation at the
hip.

Visualized osseous structures are otherwise within normal limits.
IMPRESSION: Large right axillary nodal metastasis measuring 6.6 cm short axis,
progressive.

No evidence of metastatic disease in the abdomen/pelvis.
Postsurgical changes in the right inguinal region.

Additional stable ancillary findings as above.

## 2019-02-07 NOTE — Telephone Encounter (Signed)
Per 2/11 referral sent

## 2019-03-05 ENCOUNTER — Other Ambulatory Visit: Payer: Self-pay | Admitting: General Surgery

## 2019-03-05 DIAGNOSIS — C779 Secondary and unspecified malignant neoplasm of lymph node, unspecified: Secondary | ICD-10-CM | POA: Diagnosis not present

## 2019-03-06 ENCOUNTER — Inpatient Hospital Stay: Payer: Medicare Other | Attending: Oncology

## 2019-03-06 ENCOUNTER — Telehealth: Payer: Self-pay | Admitting: Oncology

## 2019-03-06 ENCOUNTER — Inpatient Hospital Stay (HOSPITAL_BASED_OUTPATIENT_CLINIC_OR_DEPARTMENT_OTHER): Payer: Medicare Other | Admitting: Oncology

## 2019-03-06 ENCOUNTER — Inpatient Hospital Stay: Payer: Medicare Other

## 2019-03-06 VITALS — BP 153/54 | HR 54 | Temp 98.4°F | Resp 18 | Ht 68.0 in | Wt 141.6 lb

## 2019-03-06 DIAGNOSIS — Z79899 Other long term (current) drug therapy: Secondary | ICD-10-CM | POA: Insufficient documentation

## 2019-03-06 DIAGNOSIS — C4492 Squamous cell carcinoma of skin, unspecified: Secondary | ICD-10-CM

## 2019-03-06 DIAGNOSIS — C778 Secondary and unspecified malignant neoplasm of lymph nodes of multiple regions: Secondary | ICD-10-CM | POA: Insufficient documentation

## 2019-03-06 DIAGNOSIS — Z5112 Encounter for antineoplastic immunotherapy: Secondary | ICD-10-CM | POA: Insufficient documentation

## 2019-03-06 DIAGNOSIS — C44509 Unspecified malignant neoplasm of skin of other part of trunk: Secondary | ICD-10-CM

## 2019-03-06 DIAGNOSIS — C44599 Other specified malignant neoplasm of skin of other part of trunk: Secondary | ICD-10-CM

## 2019-03-06 LAB — CBC WITH DIFFERENTIAL (CANCER CENTER ONLY)
Abs Immature Granulocytes: 0.07 10*3/uL (ref 0.00–0.07)
Basophils Absolute: 0.1 10*3/uL (ref 0.0–0.1)
Basophils Relative: 0 %
Eosinophils Absolute: 0.1 10*3/uL (ref 0.0–0.5)
Eosinophils Relative: 1 %
HCT: 38.4 % — ABNORMAL LOW (ref 39.0–52.0)
Hemoglobin: 12.7 g/dL — ABNORMAL LOW (ref 13.0–17.0)
Immature Granulocytes: 1 %
Lymphocytes Relative: 4 %
Lymphs Abs: 0.6 10*3/uL — ABNORMAL LOW (ref 0.7–4.0)
MCH: 30.7 pg (ref 26.0–34.0)
MCHC: 33.1 g/dL (ref 30.0–36.0)
MCV: 92.8 fL (ref 80.0–100.0)
Monocytes Absolute: 0.9 10*3/uL (ref 0.1–1.0)
Monocytes Relative: 7 %
Neutro Abs: 12.3 10*3/uL — ABNORMAL HIGH (ref 1.7–7.7)
Neutrophils Relative %: 87 %
Platelet Count: 329 10*3/uL (ref 150–400)
RBC: 4.14 MIL/uL — AB (ref 4.22–5.81)
RDW: 13.6 % (ref 11.5–15.5)
WBC Count: 14.1 10*3/uL — ABNORMAL HIGH (ref 4.0–10.5)
nRBC: 0 % (ref 0.0–0.2)

## 2019-03-06 LAB — CMP (CANCER CENTER ONLY)
ALT: 10 U/L (ref 0–44)
AST: 18 U/L (ref 15–41)
Albumin: 2.7 g/dL — ABNORMAL LOW (ref 3.5–5.0)
Alkaline Phosphatase: 63 U/L (ref 38–126)
Anion gap: 10 (ref 5–15)
BUN: 10 mg/dL (ref 8–23)
CHLORIDE: 106 mmol/L (ref 98–111)
CO2: 22 mmol/L (ref 22–32)
Calcium: 10.3 mg/dL (ref 8.9–10.3)
Creatinine: 0.72 mg/dL (ref 0.61–1.24)
GFR, Est AFR Am: >60 mL/min (ref >60)
GFR, Estimated: >60 mL/min (ref >60)
Glucose, Bld: 100 mg/dL — ABNORMAL HIGH (ref 70–99)
Potassium: 3.5 mmol/L (ref 3.5–5.1)
SODIUM: 138 mmol/L (ref 135–145)
Total Bilirubin: 0.4 mg/dL (ref 0.3–1.2)
Total Protein: 6.8 g/dL (ref 6.5–8.1)

## 2019-03-06 LAB — TSH: TSH: 3.803 u[IU]/mL (ref 0.320–4.118)

## 2019-03-06 MED ORDER — SODIUM CHLORIDE 0.9 % IV SOLN
Freq: Once | INTRAVENOUS | Status: AC
  Administered 2019-03-06: 11:00:00 via INTRAVENOUS
  Filled 2019-03-06: qty 250

## 2019-03-06 MED ORDER — SODIUM CHLORIDE 0.9 % IV SOLN
350.0000 mg | Freq: Once | INTRAVENOUS | Status: AC
  Administered 2019-03-06: 350 mg via INTRAVENOUS
  Filled 2019-03-06: qty 7

## 2019-03-06 NOTE — Patient Instructions (Signed)
[facility] Discharge Instructions for Patients Receiving Chemotherapy  Today you received the following chemotherapy agents Libtayo  To help prevent nausea and vomiting after your treatment, we encourage you to take your nausea medication as directed.    If you develop nausea and vomiting that is not controlled by your nausea medication, call the clinic.   BELOW ARE SYMPTOMS THAT SHOULD BE REPORTED IMMEDIATELY:  *FEVER GREATER THAN 100.5 F  *CHILLS WITH OR WITHOUT FEVER  NAUSEA AND VOMITING THAT IS NOT CONTROLLED WITH YOUR NAUSEA MEDICATION  *UNUSUAL SHORTNESS OF BREATH  *UNUSUAL BRUISING OR BLEEDING  TENDERNESS IN MOUTH AND THROAT WITH OR WITHOUT PRESENCE OF ULCERS  *URINARY PROBLEMS  *BOWEL PROBLEMS  UNUSUAL RASH Items with * indicate a potential emergency and should be followed up as soon as possible.  Feel free to call the clinic should you have any questions or concerns. The clinic phone number is [phone].  Please show the CHEMO ALERT CARD at check-in to the Emergency Department and triage nurse.   

## 2019-03-06 NOTE — Progress Notes (Signed)
Hematology and Oncology Follow Up Visit  Jeffrey Hardin [id number] 31-Mar-1934 85 y.o. 03/06/2019 10:24 AM [name], MDLalonde, [name], MD   Principle Diagnosis: 85 year old man with advanced squamous cell carcinoma of the skin with metastatic disease to the axilla and inguinal adenopathy diagnosed in November 2018.     Prior Therapy: He underwent excision of a malignant lesion measuring 11 x 10 cm and layered closure completed on January 24, 2018 by Dr. Iran Planas.  The final pathology showed invasive squamous cell carcinoma with moderate differentiation measuring 8.8 cm with negative margins.      Current therapy:  Libtayo 350 mg every 3 weeks started on 06/23/2018.  He completed 11 cycles of therapy in February 2020.   Interim History: Jeffrey Hardin presents today for a repeat evaluation.  Since last visit, he reports no major changes in his health.  He has tolerated Libtayo without any recent complaints.  He denies any recent hospitalization or illnesses.  He denies any diarrhea or dyspnea on exertion.  He does report occasional constipation.  His performance status and quality of life remains unchanged.  He was able to travel on a cruise to the [place] without any difficulties.  He was evaluated by Dr. Barry Dienes for surgical evaluation of his axillary lymphadenopathy.  Patient denied headaches, blurry vision, syncope or seizures.  Denies any fevers, chills or sweats.  Denied chest pain, palpitation, orthopnea or leg edema.  Denied cough, wheezing or hemoptysis.  Denied nausea, vomiting or abdominal pain.  Denies any constipation or diarrhea.  Denies any frequency urgency or hesitancy.  Denies any arthralgias or myalgias.  Denies any skin rashes or lesions.  Denies any bleeding or clotting tendency.  Denies any easy bruising.  Denies any hair or nail changes.  Denies any anxiety or depression.  Remaining review of system is negative.     Medications: I have reviewed the patient's current  medications.  Current Outpatient Medications  Medication Sig Dispense Refill  . amLODipine-benazepril (LOTREL) 10-20 MG capsule TAKE 1 CAPSULE DAILY 90 capsule 4  . aspirin EC 81 MG tablet Take 81 mg by mouth daily.    . betamethasone dipropionate (DIPROLENE) 0.05 % ointment USE AS DIRECTED 45 g 5  . calcium carbonate (TUMS - DOSED IN MG ELEMENTAL CALCIUM) 500 MG chewable tablet Chew 2 tablets by mouth daily as needed for indigestion or heartburn.    . Glucosamine HCl (GLUCOSAMINE PO) Take 1 tablet by mouth daily.    Marland Kitchen levothyroxine (SYNTHROID, LEVOTHROID) 100 MCG tablet TAKE 1 TABLET DAILY 90 tablet 4  . Multiple Vitamins-Minerals (MULTIVITAMIN WITH MINERALS) tablet Take 1 tablet by mouth daily.      . vitamin E 400 UNIT capsule Take 400 Units by mouth daily.     No current facility-administered medications for this visit.    Facility-Administered Medications Ordered in Other Visits  Medication Dose Route Frequency Provider Last Rate Last Dose  . influenza  inactive virus vaccine (FLUZONE/FLUARIX) injection 0.5 mL  0.5 mL Intramuscular Once [name], MD         Allergies: No Known Allergies  Past Medical History, Surgical history, Social history, and Family History remain unchanged on review.    Physical Exam:      ECOG: 1  General appearance: Comfortable appearing without any discomfort Head: Normocephalic without any trauma Oropharynx: Mucous membranes are moist and pink without any thrush or ulcers. Eyes: Pupils are equal and round reactive to light. Lymph nodes:  Palpable right axillary lymph  nodes.  No additional lymphadenopathy noted. Heart:regular rate and rhythm.  S1 and S2.  Right leg edema noted. Lung: Clear without any rhonchi or wheezes.  No dullness to percussion. Abdomin: Soft, nontender, nondistended with good bowel sounds.  No hepatosplenomegaly. Musculoskeletal: No joint deformity or effusion.  Full range of motion noted. Neurological: No deficits  noted on motor, sensory and deep tendon reflex exam. Skin: No petechial rash or dryness.  Appeared moist.              Lab Results: Lab Results  Component Value Date   WBC 9.6 02/06/2019   HGB 13.5 02/06/2019   HCT 40.2 02/06/2019   MCV 92.8 02/06/2019   PLT 303 02/06/2019     Chemistry      Component Value Date/Time   NA 137 02/06/2019 0930   K 3.5 02/06/2019 0930   CL 110 02/06/2019 0930   CO2 20 (L) 02/06/2019 0930   BUN 11 02/06/2019 0930   CREATININE 0.74 02/06/2019 0930   CREATININE 0.63 (L) 01/05/2017 0934      Component Value Date/Time   CALCIUM 10.0 02/06/2019 0930   ALKPHOS 68 02/06/2019 0930   AST 18 02/06/2019 0930   ALT 9 02/06/2019 0930   BILITOT 0.7 02/06/2019 0930       Atherosclerotic calcifications of the abdominal aorta and branch vessels.  No suspicious abdominopelvic lymphadenopathy.  Reproductive: Prostate is grossly unremarkable.  Other: No abdominopelvic ascites.  Postsurgical changes in the right inguinal region.  Musculoskeletal: Status post left lower extremity amputation at the hip.  Visualized osseous structures are otherwise within normal limits.  IMPRESSION: Large right axillary nodal metastasis measuring 6.6 cm short axis, progressive.  No evidence of metastatic disease in the abdomen/pelvis. Postsurgical changes in the right inguinal region.  Additional stable ancillary findings as above.    Impression and Plan:  85 year old man with:  1.  Metastatic squamous cell carcinoma with involvement in axillary and inguinal adenopathy.     He has been receiving immunotherapy with excellent response although he has progressed in the right axillary region based on imaging studies in February 2020.  He has no other signs of metastatic disease in any other areas.  He has been referred to Dr. Barry Dienes and is scheduled to have axillary lymph node surgery by the end of the month.  Risks and benefits of continuing  Libtayo was discussed today.  He is agreeable to proceed with therapy today and this medication will be held until he is fully healed from his operation.    2.  IV access: No issues with peripheral veins noted at this time.  No need for a Port-A-Cath insertion.  3.  Antiemetics: Antiemetics are available to him at this time.  4.  Immune related complications: I continue to reiterate complications related to this therapy include colitis, pneumonitis and arthritis.  He has no complications noted at this time.  5.  Goals of care and prognosis: Therapy remains palliative although aggressive therapy is warranted given his excellent performance status.  6.  Follow-up: In 6 weeks to assess his recovery from surgery.  25  minutes was spent with the patient face-to-face today.  More than 50% of time was spent on reviewing his disease status, reviewing imaging studies and answering questions regarding future plan of care.     [name], MD 3/10/202010:24 AM

## 2019-03-06 NOTE — Progress Notes (Signed)
Ok to proceed with treatment with elevated Calcium level today per MD (per RN).  Hardie Pulley, PharmD, BCPS, BCOP

## 2019-03-06 NOTE — Telephone Encounter (Signed)
Scheduled appt per 3/10 los. ° °Printed calendar and avs. °

## 2019-03-12 ENCOUNTER — Other Ambulatory Visit: Payer: Self-pay

## 2019-03-12 ENCOUNTER — Telehealth: Payer: Self-pay | Admitting: Family Medicine

## 2019-03-12 NOTE — Telephone Encounter (Signed)
Work this out

## 2019-03-12 NOTE — Telephone Encounter (Signed)
Pt's wife called and states that pt is going to have surgery to remove a mass under his arm pit next  Wednesday. She states at that point he will be unable to use his crutches. They recently rented a battery scooter and it work very well.he is requesting orders to be able to get one of these scooters.  He will needs this in order to not be completely bedridden after surgery. Please advise pt or Hilda at [phone] or [phone].

## 2019-03-12 NOTE — Telephone Encounter (Signed)
Pt advised of having DME power wheel chair ordered by the [place]. Jeffrey Hardin

## 2019-03-14 NOTE — Pre-Procedure Instructions (Signed)
Aldred Mase Rubano  03/14/2019      [facility] - [place], [place] - [address] [place] [address] Phone: [phone] Fax: [phone]  [place] [address] [place]), [place] - [address] [place]) [place] [address] Phone: [phone] Fax: [phone]    Your procedure is scheduled on March 21, 2019.  Report to [facility] Admitting at 830 AM.  Call this number if you have problems the morning of surgery:  [phone]   Remember:  Do not eat after midnight.  You may drink clear liquids until 0730.  Clear liquids allowed are:                    Water, Juice (non-citric and without pulp), Clear Tea, Black Coffee only and Gatorade    Take these medicines the morning of surgery with A SIP OF WATER  Levothyroxine (synthroid)  Follow your surgeon's instructions on when to hold/resume aspirin.  If no instructions were given call the office to determine how they would like to you take aspirin  7 days prior to surgery STOP taking any NSAIDS: Aleve, Naproxen, Ibuprofen, Motrin, Advil, Goody's, BC's, all herbal medications, fish oil, and all vitamins    Do not wear jewelry  Do not wear lotions, powders, or colognes, or deodorant.  Men may shave face and neck.  Do not bring valuables to the hospital.  [facility] is not responsible for any belongings or valuables.  Contacts, dentures or bridgework may not be worn into surgery.  Leave your suitcase in the car.  After surgery it may be brought to your room.  For patients admitted to the hospital, discharge time will be determined by your treatment team.  Patients discharged the day of surgery will not be allowed to drive home.    [facility] Preparing For Surgery  Before surgery, you can play an important role. Because skin is not sterile, your skin needs to be as free of germs as possible. You can reduce the number of germs on your  skin by washing with CHG (chlorahexidine gluconate) Soap before surgery.  CHG is an antiseptic cleaner which kills germs and bonds with the skin to continue killing germs even after washing.    Oral Hygiene is also important to reduce your risk of infection.  Remember - BRUSH YOUR TEETH THE MORNING OF SURGERY WITH YOUR REGULAR TOOTHPASTE  Please do not use if you have an allergy to CHG or antibacterial soaps. If your skin becomes reddened/irritated stop using the CHG.  Do not shave (including legs and underarms) for at least 48 hours prior to first CHG shower. It is OK to shave your face.  Please follow these instructions carefully.   1. Shower the NIGHT BEFORE SURGERY and the MORNING OF SURGERY with CHG.   2. If you chose to wash your hair, wash your hair first as usual with your normal shampoo.  3. After you shampoo, rinse your hair and body thoroughly to remove the shampoo.  4. Use CHG as you would any other liquid soap. You can apply CHG directly to the skin and wash gently with a scrungie or a clean washcloth.   5. Apply the CHG Soap to your body ONLY FROM THE NECK DOWN.  Do not use on open wounds or open sores. Avoid contact with your eyes, ears, mouth and genitals (private parts). Wash Face and  genitals (private parts)  with your normal soap.  6. Wash thoroughly, paying special attention to the area where your surgery will be performed.  7. Thoroughly rinse your body with warm water from the neck down.  8. DO NOT shower/wash with your normal soap after using and rinsing off the CHG Soap.  9. Pat yourself dry with a CLEAN TOWEL.  10. Wear CLEAN PAJAMAS to bed the night before surgery, wear comfortable clothes the morning of surgery  11. Place CLEAN SHEETS on your bed the night of your first shower and DO NOT SLEEP WITH PETS.   Day of Surgery:  Do not apply any deodorants/lotions.  Please wear clean clothes to the hospital/surgery center.   Remember to brush your teeth WITH  YOUR REGULAR TOOTHPASTE.   Please read over the following fact sheets that you were given.

## 2019-03-14 NOTE — Progress Notes (Addendum)
PCP - Jill Alexanders, MD Cardiologist - pt denies  Chest x-ray - pt denies - CT scan  02/02/2019 EKG - 03/15/2019  Stress Test - pt denies ECHO - pt denies  Cardiac Cath - pt denies  Sleep Study - pt denies CPAP - n/a  Fasting Blood Sugar - n/a Checks Blood Sugar _____ times a day-n/a  Blood Thinner Instructions: n/a Aspirin Instructions: Aspirin 81 mg- hold beginning Saturday 03/17/2019  Anesthesia review: Yes EKG-junctional rhythm, known h/o A. fib  Patient denies shortness of breath, fever, cough and chest pain at PAT appointment  Patient verbalized understanding of instructions that were given to them at the PAT appointment. Patient was also instructed that they will need to review over the PAT instructions again at home before surgery.   Coronavirus Screening  Have you experienced the following symptoms:  Cough yes/no: No Fever (>100.60F)  yes/no: No Runny nose yes/no: No Sore throat yes/no: No Difficulty breathing/shortness of breath  yes/no: No  Have you or a family member traveled in the last 14 days and where? yes/no: No  Reminded patient and family member that hospital visitation restrictions are in effect and the importance of the restrictions.

## 2019-03-15 ENCOUNTER — Encounter (HOSPITAL_COMMUNITY): Payer: Self-pay

## 2019-03-15 ENCOUNTER — Other Ambulatory Visit: Payer: Self-pay

## 2019-03-15 ENCOUNTER — Encounter (HOSPITAL_COMMUNITY)
Admission: RE | Admit: 2019-03-15 | Discharge: 2019-03-15 | Disposition: A | Discharge status: Home / Self Care | Payer: Medicare Other | Source: Ambulatory Visit | Attending: General Surgery | Admitting: General Surgery

## 2019-03-15 DIAGNOSIS — Z01818 Encounter for other preprocedural examination: Secondary | ICD-10-CM | POA: Diagnosis not present

## 2019-03-15 LAB — COMPREHENSIVE METABOLIC PANEL
ALBUMIN: 2.7 g/dL — AB (ref 3.5–5.0)
ALT: 11 U/L (ref 0–44)
AST: 20 U/L (ref 15–41)
Alkaline Phosphatase: 58 U/L (ref 38–126)
Anion gap: 5 (ref 5–15)
BILIRUBIN TOTAL: 0.4 mg/dL (ref 0.3–1.2)
BUN: 11 mg/dL (ref 8–23)
CO2: 22 mmol/L (ref 22–32)
Calcium: 10.9 mg/dL — ABNORMAL HIGH (ref 8.9–10.3)
Chloride: 108 mmol/L (ref 98–111)
Creatinine, Ser: 0.79 mg/dL (ref 0.61–1.24)
GFR calc Af Amer: >60 mL/min (ref >60)
Glucose, Bld: 95 mg/dL (ref 70–99)
Potassium: 4 mmol/L (ref 3.5–5.1)
Sodium: 135 mmol/L (ref 135–145)
Total Protein: 6.5 g/dL (ref 6.5–8.1)

## 2019-03-15 LAB — CBC
HEMATOCRIT: 39.1 % (ref 39.0–52.0)
Hemoglobin: 12.6 g/dL — ABNORMAL LOW (ref 13.0–17.0)
MCH: 30.1 pg (ref 26.0–34.0)
MCHC: 32.2 g/dL (ref 30.0–36.0)
MCV: 93.3 fL (ref 80.0–100.0)
Platelets: 422 10*3/uL — ABNORMAL HIGH (ref 150–400)
RBC: 4.19 MIL/uL — ABNORMAL LOW (ref 4.22–5.81)
RDW: 13.6 % (ref 11.5–15.5)
WBC: 10.2 10*3/uL (ref 4.0–10.5)
nRBC: 0 % (ref 0.0–0.2)

## 2019-03-16 NOTE — Anesthesia Preprocedure Evaluation (Addendum)
Anesthesia Evaluation  Patient identified by MRN, date of birth, ID band Patient awake    Reviewed: Allergy & Precautions, NPO status , Patient's Chart, lab work & pertinent test results  Airway Mallampati: II  TM Distance: >3 FB Neck ROM: Full    Dental no notable dental hx.    Pulmonary neg pulmonary ROS,    Pulmonary exam normal breath sounds clear to auscultation       Cardiovascular hypertension, Pt. on medications Normal cardiovascular exam+ dysrhythmias Atrial Fibrillation  Rhythm:Regular Rate:Normal     Neuro/Psych negative neurological ROS  negative psych ROS   GI/Hepatic negative GI ROS, Neg liver ROS,   Endo/Other  negative endocrine ROSHypothyroidism   Renal/GU negative Renal ROS  negative genitourinary   Musculoskeletal negative musculoskeletal ROS (+)   Abdominal   Peds negative pediatric ROS (+)  Hematology negative hematology ROS (+)   Anesthesia Other Findings Squamous Cell Carcinoma  Reproductive/Obstetrics negative OB ROS                                                             Anesthesia Evaluation   Patient awake    Reviewed: Allergy & Precautions, NPO status , Patient's Chart, lab work & pertinent test results  Airway Mallampati: II  TM Distance: >3 FB Neck ROM: Full    Dental  (+) Dental Advisory Given, Partial Upper   Pulmonary    breath sounds clear to auscultation       Cardiovascular hypertension, Pt. on medications + dysrhythmias Atrial Fibrillation  Rhythm:Irregular Rate:Bradycardia     Neuro/Psych negative neurological ROS  negative psych ROS   GI/Hepatic negative GI ROS, Neg liver ROS,   Endo/Other  Hypothyroidism   Renal/GU negative Renal ROS     Musculoskeletal negative musculoskeletal ROS (+)   Abdominal   Peds  Hematology   Anesthesia Other Findings   Reproductive/Obstetrics                             Anesthesia Physical Anesthesia Plan  ASA: III  Anesthesia Plan: General   Post-op Pain Management:    Induction: Intravenous  PONV Risk Score and Plan: 3 and Ondansetron, Dexamethasone and Treatment may vary due to age or medical condition  Airway Management Planned: Oral ETT  Additional Equipment: None  Intra-op Plan:   Post-operative Plan: Extubation in OR  Informed Consent: I have reviewed the patients History and Physical, chart, labs and discussed the procedure including the risks, benefits and alternatives for the proposed anesthesia with the patient or authorized representative who has indicated his/her understanding and acceptance.   Dental advisory given  Plan Discussed with: CRNA  Anesthesia Plan Comments:        Anesthesia Quick Evaluation  Anesthesia Physical Anesthesia Plan  ASA: III  Anesthesia Plan: General   Post-op Pain Management:    Induction: Intravenous  PONV Risk Score and Plan: 2 and Ondansetron, Midazolam and Treatment may vary due to age or medical condition  Airway Management Planned: LMA  Additional Equipment:   Intra-op Plan:   Post-operative Plan: Extubation in OR  Informed Consent: I have reviewed the patients History and Physical, chart, labs and discussed the procedure including the risks, benefits and alternatives for the proposed anesthesia with the patient  or authorized representative who has indicated his/her understanding and acceptance.     Dental advisory given  Plan Discussed with: CRNA  Anesthesia Plan Comments: (EKG 03/15/19 reviewed. Afib with junctional rhythm. Appears stable compared to previous.)       Anesthesia Quick Evaluation

## 2019-03-20 ENCOUNTER — Other Ambulatory Visit: Payer: Self-pay

## 2019-03-20 ENCOUNTER — Encounter (HOSPITAL_BASED_OUTPATIENT_CLINIC_OR_DEPARTMENT_OTHER): Payer: Self-pay | Admitting: *Deleted

## 2019-03-21 ENCOUNTER — Ambulatory Visit (HOSPITAL_BASED_OUTPATIENT_CLINIC_OR_DEPARTMENT_OTHER): Payer: Medicare Other | Admitting: Physician Assistant

## 2019-03-21 ENCOUNTER — Other Ambulatory Visit: Payer: Self-pay

## 2019-03-21 ENCOUNTER — Encounter (HOSPITAL_BASED_OUTPATIENT_CLINIC_OR_DEPARTMENT_OTHER): Payer: Self-pay | Admitting: *Deleted

## 2019-03-21 ENCOUNTER — Encounter (HOSPITAL_BASED_OUTPATIENT_CLINIC_OR_DEPARTMENT_OTHER)
Admission: RE | Disposition: A | Discharge status: Home / Self Care | Payer: Self-pay | Source: Home / Self Care | Attending: General Surgery

## 2019-03-21 ENCOUNTER — Ambulatory Visit (HOSPITAL_BASED_OUTPATIENT_CLINIC_OR_DEPARTMENT_OTHER)
Admission: RE | Admit: 2019-03-21 | Discharge: 2019-03-21 | Disposition: A | Discharge status: Home / Self Care | Payer: Medicare Other | Attending: General Surgery | Admitting: General Surgery

## 2019-03-21 ENCOUNTER — Ambulatory Visit (HOSPITAL_BASED_OUTPATIENT_CLINIC_OR_DEPARTMENT_OTHER): Payer: Medicare Other | Admitting: Anesthesiology

## 2019-03-21 DIAGNOSIS — Z89622 Acquired absence of left hip joint: Secondary | ICD-10-CM | POA: Diagnosis not present

## 2019-03-21 DIAGNOSIS — Z89612 Acquired absence of left leg above knee: Secondary | ICD-10-CM | POA: Diagnosis not present

## 2019-03-21 DIAGNOSIS — I10 Essential (primary) hypertension: Secondary | ICD-10-CM | POA: Insufficient documentation

## 2019-03-21 DIAGNOSIS — E039 Hypothyroidism, unspecified: Secondary | ICD-10-CM | POA: Insufficient documentation

## 2019-03-21 DIAGNOSIS — C792 Secondary malignant neoplasm of skin: Secondary | ICD-10-CM | POA: Diagnosis not present

## 2019-03-21 DIAGNOSIS — Z79890 Hormone replacement therapy (postmenopausal): Secondary | ICD-10-CM | POA: Insufficient documentation

## 2019-03-21 DIAGNOSIS — Z7982 Long term (current) use of aspirin: Secondary | ICD-10-CM | POA: Insufficient documentation

## 2019-03-21 DIAGNOSIS — C773 Secondary and unspecified malignant neoplasm of axilla and upper limb lymph nodes: Secondary | ICD-10-CM | POA: Diagnosis not present

## 2019-03-21 DIAGNOSIS — Z79899 Other long term (current) drug therapy: Secondary | ICD-10-CM | POA: Diagnosis not present

## 2019-03-21 DIAGNOSIS — C44529 Squamous cell carcinoma of skin of other part of trunk: Secondary | ICD-10-CM | POA: Diagnosis not present

## 2019-03-21 DIAGNOSIS — Z85828 Personal history of other malignant neoplasm of skin: Secondary | ICD-10-CM | POA: Diagnosis not present

## 2019-03-21 HISTORY — PX: EXCISION MASS UPPER EXTREMETIES: SHX6704

## 2019-03-21 SURGERY — EXCISION MASS UPPER EXTREMITIES
Anesthesia: General | Site: Axilla | Laterality: Right

## 2019-03-21 MED ORDER — PROMETHAZINE HCL 25 MG/ML IJ SOLN: 6.2500 mg | INTRAMUSCULAR | Status: DC | PRN

## 2019-03-21 MED ORDER — CEFAZOLIN SODIUM-DEXTROSE 2-4 GM/100ML-% IV SOLN
2.0000 g | INTRAVENOUS | Status: AC
  Administered 2019-03-21: 2 g via INTRAVENOUS

## 2019-03-21 MED ORDER — LIDOCAINE HCL (CARDIAC) PF 100 MG/5ML IV SOSY
PREFILLED_SYRINGE | INTRAVENOUS | Status: DC | PRN
  Administered 2019-03-21: 60 mg via INTRAVENOUS

## 2019-03-21 MED ORDER — GABAPENTIN 300 MG PO CAPS
ORAL_CAPSULE | ORAL | Status: AC
  Filled 2019-03-21: qty 1

## 2019-03-21 MED ORDER — ACETAMINOPHEN 500 MG PO TABS
1000.0000 mg | ORAL_TABLET | ORAL | Status: AC
  Administered 2019-03-21: 1000 mg via ORAL

## 2019-03-21 MED ORDER — CHLORHEXIDINE GLUCONATE CLOTH 2 % EX PADS: 6.0000 | MEDICATED_PAD | Freq: Once | CUTANEOUS | Status: DC

## 2019-03-21 MED ORDER — ONDANSETRON HCL 4 MG/2ML IJ SOLN
INTRAMUSCULAR | Status: DC | PRN
  Administered 2019-03-21: 4 mg via INTRAVENOUS

## 2019-03-21 MED ORDER — GABAPENTIN 300 MG PO CAPS
300.0000 mg | ORAL_CAPSULE | ORAL | Status: AC
  Administered 2019-03-21: 300 mg via ORAL

## 2019-03-21 MED ORDER — LIDOCAINE-EPINEPHRINE (PF) 1 %-1:200000 IJ SOLN
INTRAMUSCULAR | Status: DC | PRN
  Administered 2019-03-21: 40 mL via INTRAMUSCULAR

## 2019-03-21 MED ORDER — METHYLENE BLUE 0.5 % INJ SOLN
INTRAVENOUS | Status: AC
  Filled 2019-03-21: qty 10

## 2019-03-21 MED ORDER — PHENYLEPHRINE 40 MCG/ML (10ML) SYRINGE FOR IV PUSH (FOR BLOOD PRESSURE SUPPORT)
PREFILLED_SYRINGE | INTRAVENOUS | Status: AC
  Filled 2019-03-21: qty 10

## 2019-03-21 MED ORDER — CEFAZOLIN SODIUM-DEXTROSE 2-4 GM/100ML-% IV SOLN
INTRAVENOUS | Status: AC
  Filled 2019-03-21: qty 100

## 2019-03-21 MED ORDER — SCOPOLAMINE 1 MG/3DAYS TD PT72: 1.0000 | MEDICATED_PATCH | Freq: Once | TRANSDERMAL | Status: DC | PRN

## 2019-03-21 MED ORDER — OXYCODONE HCL 5 MG PO TABS: 5.0000 mg | ORAL_TABLET | Freq: Once | ORAL | Status: DC | PRN

## 2019-03-21 MED ORDER — OXYCODONE HCL 5 MG/5ML PO SOLN: 5.0000 mg | Freq: Once | ORAL | Status: DC | PRN

## 2019-03-21 MED ORDER — ROCURONIUM BROMIDE 50 MG/5ML IV SOSY
PREFILLED_SYRINGE | INTRAVENOUS | Status: AC
  Filled 2019-03-21: qty 5

## 2019-03-21 MED ORDER — OXYCODONE HCL 5 MG PO TABS: 2.5000 mg | ORAL_TABLET | Freq: Four times a day (QID) | ORAL | 0 refills | Status: DC | PRN

## 2019-03-21 MED ORDER — EPINEPHRINE PF 1 MG/ML IJ SOLN
INTRAMUSCULAR | Status: AC
  Filled 2019-03-21: qty 2

## 2019-03-21 MED ORDER — HYDROMORPHONE HCL 1 MG/ML IJ SOLN: 0.2500 mg | INTRAMUSCULAR | Status: DC | PRN

## 2019-03-21 MED ORDER — PROPOFOL 10 MG/ML IV BOLUS
INTRAVENOUS | Status: DC | PRN
  Administered 2019-03-21: 200 mg via INTRAVENOUS

## 2019-03-21 MED ORDER — FENTANYL CITRATE (PF) 100 MCG/2ML IJ SOLN
INTRAMUSCULAR | Status: AC
  Filled 2019-03-21: qty 2

## 2019-03-21 MED ORDER — MIDAZOLAM HCL 2 MG/2ML IJ SOLN: 1.0000 mg | INTRAMUSCULAR | Status: DC | PRN

## 2019-03-21 MED ORDER — EPHEDRINE 5 MG/ML INJ
INTRAVENOUS | Status: AC
  Filled 2019-03-21: qty 20

## 2019-03-21 MED ORDER — ACETAMINOPHEN 500 MG PO TABS
ORAL_TABLET | ORAL | Status: AC
  Filled 2019-03-21: qty 2

## 2019-03-21 MED ORDER — LACTATED RINGERS IV SOLN
INTRAVENOUS | Status: DC
  Administered 2019-03-21 (×2): via INTRAVENOUS

## 2019-03-21 MED ORDER — SODIUM CHLORIDE (PF) 0.9 % IJ SOLN
INTRAMUSCULAR | Status: AC
  Filled 2019-03-21: qty 10

## 2019-03-21 MED ORDER — FENTANYL CITRATE (PF) 100 MCG/2ML IJ SOLN
50.0000 ug | INTRAMUSCULAR | Status: AC | PRN
  Administered 2019-03-21 (×4): 50 ug via INTRAVENOUS

## 2019-03-21 SURGICAL SUPPLY — 69 items
APL PRP STRL LF DISP 70% ISPRP (MISCELLANEOUS) ×1
BINDER BREAST LRG (GAUZE/BANDAGES/DRESSINGS) IMPLANT
BINDER BREAST MEDIUM (GAUZE/BANDAGES/DRESSINGS) IMPLANT
BINDER BREAST XLRG (GAUZE/BANDAGES/DRESSINGS) IMPLANT
BINDER BREAST XXLRG (GAUZE/BANDAGES/DRESSINGS) IMPLANT
BLADE SURG 10 STRL SS (BLADE) ×3 IMPLANT
BLADE SURG 15 STRL LF DISP TIS (BLADE) IMPLANT
BLADE SURG 15 STRL SS (BLADE)
BNDG GAUZE ELAST 4 BULKY (GAUZE/BANDAGES/DRESSINGS) ×3 IMPLANT
CANISTER SUCT 1200ML W/VALVE (MISCELLANEOUS) ×3 IMPLANT
CHLORAPREP W/TINT 26 (MISCELLANEOUS) ×3 IMPLANT
CLIP VESOCCLUDE LG 6/CT (CLIP) IMPLANT
CLIP VESOCCLUDE MED 6/CT (CLIP) ×12 IMPLANT
CLIP VESOCCLUDE SM WIDE 6/CT (CLIP) IMPLANT
CLOSURE WOUND 1/2 X4 (GAUZE/BANDAGES/DRESSINGS) ×1
COVER MAYO STAND STRL (DRAPES) ×3 IMPLANT
COVER PROBE W GEL 5X96 (DRAPES) ×3 IMPLANT
COVER WAND RF STERILE (DRAPES) IMPLANT
DECANTER SPIKE VIAL GLASS SM (MISCELLANEOUS) IMPLANT
DRAIN CHANNEL 19F RND (DRAIN) IMPLANT
DRAIN HEMOVAC 1/8 X 5 (WOUND CARE) ×2 IMPLANT
DRAPE UTILITY XL STRL (DRAPES) ×3 IMPLANT
DRSG PAD ABDOMINAL 8X10 ST (GAUZE/BANDAGES/DRESSINGS) IMPLANT
DRSG TEGADERM 4X4.75 (GAUZE/BANDAGES/DRESSINGS) IMPLANT
ELECT COATED BLADE 2.86 ST (ELECTRODE) ×3 IMPLANT
ELECT REM PT RETURN 9FT ADLT (ELECTROSURGICAL) ×3
ELECTRODE REM PT RTRN 9FT ADLT (ELECTROSURGICAL) ×1 IMPLANT
EVACUATOR SILICONE 100CC (DRAIN) ×3 IMPLANT
GAUZE SPONGE 4X4 12PLY STRL (GAUZE/BANDAGES/DRESSINGS) IMPLANT
GLOVE BIO SURGEON STRL SZ 6 (GLOVE) ×3 IMPLANT
GLOVE BIO SURGEON STRL SZ 6.5 (GLOVE) ×2 IMPLANT
GLOVE BIO SURGEONS STRL SZ 6.5 (GLOVE) ×2
GLOVE BIOGEL PI IND STRL 6.5 (GLOVE) ×1 IMPLANT
GLOVE BIOGEL PI IND STRL 7.0 (GLOVE) IMPLANT
GLOVE BIOGEL PI INDICATOR 6.5 (GLOVE) ×2
GLOVE BIOGEL PI INDICATOR 7.0 (GLOVE) ×4
GOWN STRL REUS W/ TWL LRG LVL3 (GOWN DISPOSABLE) ×1 IMPLANT
GOWN STRL REUS W/TWL 2XL LVL3 (GOWN DISPOSABLE) ×3 IMPLANT
GOWN STRL REUS W/TWL LRG LVL3 (GOWN DISPOSABLE) ×6
LIGHT WAVEGUIDE WIDE FLAT (MISCELLANEOUS) IMPLANT
NDL HYPO 25X1 1.5 SAFETY (NEEDLE) ×2 IMPLANT
NDL SAFETY ECLIPSE 18X1.5 (NEEDLE) ×1 IMPLANT
NEEDLE HYPO 18GX1.5 SHARP (NEEDLE) ×3
NEEDLE HYPO 25X1 1.5 SAFETY (NEEDLE) ×6 IMPLANT
NS IRRIG 1000ML POUR BTL (IV SOLUTION) ×2 IMPLANT
PACK BASIN DAY SURGERY FS (CUSTOM PROCEDURE TRAY) ×3 IMPLANT
PACK UNIVERSAL I (CUSTOM PROCEDURE TRAY) ×3 IMPLANT
PENCIL BUTTON HOLSTER BLD 10FT (ELECTRODE) ×3 IMPLANT
PIN SAFETY STERILE (MISCELLANEOUS) IMPLANT
SLEEVE SCD COMPRESS KNEE MED (MISCELLANEOUS) ×2 IMPLANT
SPONGE LAP 18X18 RF (DISPOSABLE) ×9 IMPLANT
SPONGE LAP 4X18 RFD (DISPOSABLE) IMPLANT
STAPLER VISISTAT 35W (STAPLE) IMPLANT
STOCKINETTE IMPERVIOUS LG (DRAPES) ×3 IMPLANT
STRIP CLOSURE SKIN 1/2X4 (GAUZE/BANDAGES/DRESSINGS) ×2 IMPLANT
SUT ETHILON 2 0 FS 18 (SUTURE) ×2 IMPLANT
SUT ETHILON 3 0 PS 1 (SUTURE) ×8 IMPLANT
SUT MON AB 4-0 PC3 18 (SUTURE) ×3 IMPLANT
SUT SILK 2 0 SH (SUTURE) IMPLANT
SUT VIC AB 2-0 SH 27 (SUTURE)
SUT VIC AB 2-0 SH 27XBRD (SUTURE) IMPLANT
SUT VIC AB 3-0 SH 27 (SUTURE) ×6
SUT VIC AB 3-0 SH 27X BRD (SUTURE) ×2 IMPLANT
SYR BULB 3OZ (MISCELLANEOUS) IMPLANT
SYR CONTROL 10ML LL (SYRINGE) ×6 IMPLANT
TOWEL GREEN STERILE FF (TOWEL DISPOSABLE) ×3 IMPLANT
TUBE CONNECTING 20'X1/4 (TUBING) ×1
TUBE CONNECTING 20X1/4 (TUBING) ×2 IMPLANT
YANKAUER SUCT BULB TIP NO VENT (SUCTIONS) ×3 IMPLANT

## 2019-03-21 NOTE — Op Note (Signed)
PRE-OPERATIVE DIAGNOSIS: metastatic squamous cell cancer to right axilla from abdominal wall  POST-OPERATIVE DIAGNOSIS:  Same  PROCEDURE:  Procedure(s): Excision of malignant axillary mass with involved skin, mass 13x9x9 cm  SURGEON:  Surgeon(s): Stark Klein, MD  ANESTHESIA:   local and general  DRAINS: (10 Fr) Hemovact drain(s) in the right axilla with  Suction Clamped   LOCAL MEDICATIONS USED:  BUPIVICAINE  and LIDOCAINE   SPECIMEN:  Source of Specimen:  right axillary mass with skin  DISPOSITION OF SPECIMEN:  PATHOLOGY  COUNTS:  YES  DICTATION: .Dragon Dictation  PLAN OF CARE: Discharge to home after PACU  PATIENT DISPOSITION:  PACU - hemodynamically stable.  FINDINGS:  Large mobile mass in axilla tethered to skin, weeping tumor necrosis.    EBL: 100 mL  PROCEDURE:  Patient was identified in the holding area and taken to the operating room where he was placed supine on the operating room table.  General anesthesia was induced.  The right arm and axilla were prepped and draped in sterile fashion.  A timeout was performed according to the surgical safety checklist.  When all was correct, we continued.  The large axillary mass had started growing through the skin in multiple areas.  The skin was incised with an ellipse oriented longitudinally to incorporate the involved skin.  Skin hooks were used to elevate the adjacent tissue and the subcutaneous tissue was divided with the cautery.  The tonsil clamp was used to assist with dissection.  Vessels were clipped with medium sized hemoclips.  Once we got deeper behind the mass, the thoracodorsal pedicle was just behind the mass.  This was taken down.  Small lymphovascular channels were clipped.  The long thoracic nerve was not identified as this mass spilled easily away from the chest wall laterally.  There were several small adjacent lymph nodes next to the large mass.  The bulk of the mass was around a centimeter from the axillary  vein, but this tissue was quite fibrotic and concerning.  The fibrotic tissue was left in place on the axillary vein as it was quite a large portion of vein and would not be able to come out without significant vascular reconstruction.  This is a palliative procedure so that was not undertaken.  The mass was then divided from all of the underlying lymphovascular portions.  The skin on the posterior aspect was then divided away from the incision and the mass was able to be removed.  Of note, the lymphatics behind the pectoralis minor and the higher level 2 nodes were left in place.  The cavity was irrigated and inspected for hemostasis.  Hemostasis was achieved with the cautery and clips.  The 10 [place] Blake drain was placed inferior to the incision.  The incision was then closed with 3-0 Monocryl baseball suture.  4-5 sutures were used for the entire incision.  The skin was then cleaned, dried, and dressed with Xeroform, gauze, ABDs, and a abdominal binder.  The patient was allowed to emerge from anesthesia and was taken to the PACU in stable condition.  Needle, sponge, and instrument counts were correct x2.

## 2019-03-21 NOTE — Anesthesia Procedure Notes (Signed)
Procedure Name: LMA Insertion Date/Time: 03/21/2019 10:51 AM Performed by: Signe Colt, CRNA Pre-anesthesia Checklist: Patient identified, Emergency Drugs available, Suction available and Patient being monitored Patient Re-evaluated:Patient Re-evaluated prior to induction Oxygen Delivery Method: Circle system utilized Preoxygenation: Pre-oxygenation with 100% oxygen Induction Type: IV induction Ventilation: Mask ventilation without difficulty LMA: LMA inserted LMA Size: 4.0 Number of attempts: 1 Airway Equipment and Method: Bite block Placement Confirmation: positive ETCO2 Tube secured with: Tape Dental Injury: Teeth and Oropharynx as per pre-operative assessment

## 2019-03-21 NOTE — Discharge Instructions (Addendum)
Next Tylenol at 3:30 PM.    Surgical [facility] Surgical drains are used to remove extra fluid that normally builds up in a surgical wound after surgery. A surgical drain helps to heal a surgical wound. Different kinds of surgical drains include:  Active drains. These drains use suction to pull drainage away from the surgical wound. Drainage flows through a tube to a container outside of the body. It is important to keep the bulb or the drainage container flat (compressed) at all times, except while you empty it. Flattening the bulb or container creates suction. The two most common types of active drains are bulb drains and Hemovac drains.  Passive drains. These drains allow fluid to drain naturally, by gravity. Drainage flows through a tube to a bandage (dressing) or a container outside of the body. Passive drains do not need to be emptied. The most common type of passive drain is the Penrose drain. A drain is placed during surgery. Immediately after surgery, drainage is usually bright red and a little thicker than water. The drainage may gradually turn yellow or pink and become thinner. It is likely that your health care provider will remove the drain when the drainage stops or when the amount decreases to 1-2 Tbsp (15-30 mL) during a 24-hour period. How to care for your surgical drain It is important to care for your drain to prevent infection. If your drain is placed at your back, or any other hard-to-reach area, ask another person to assist you in performing the following steps:  Keep the skin around the drain dry and covered with a dressing at all times.  Check your drain area every day for signs of infection. Check for: ? More redness, swelling, or pain. ? Pus or a bad smell. ? Cloudy drainage. Follow instructions from your health care provider about how to take care of your drain and how to change your dressing. Change your dressing at least one time every day. Change it more often  if needed to keep the dressing dry. Make sure you: 1. Gather your supplies, including: ? Tape. ? Germ-free cleaning solution (sterile saline). ? Split gauze drain sponge: 4 x 4 inches (10 x 10 cm). ? Gauze square: 4 x 4 inches (10 x 10 cm). 2. Wash your hands with soap and water before you change your dressing. If soap and water are not available, use hand sanitizer. 3. Remove the old dressing. Avoid using scissors to do that. 4. Use sterile saline to clean your skin around the drain. 5. Place the tube through the slit in a drain sponge. Place the drain sponge so that it covers your wound. 6. Place the gauze square or another drain sponge on top of the drain sponge that is on the wound. Make sure the tube is between those layers. 7. Tape the dressing to your skin. 8. If you have an active bulb or Hemovac drain, tape the drainage tube to your skin 1-2 inches (2.5-5 cm) below the place where the tube enters your body. Taping keeps the tube from pulling on any stitches (sutures) that you have. 9. Wash your hands with soap and water. 10. Write down the color of your drainage and how often you change your dressing. How to empty your active bulb or Hemovac drain  1. Make sure that you have a measuring cup that you can empty your drainage into. 2. Wash your hands with soap and water. If soap and water are not available, use hand  sanitizer. 3. Gently move your fingers down the tube while squeezing very lightly. This is called stripping the tube. This clears any drainage, clots, or tissue from the tube. ? Do not pull on the tube. ? You may need to strip the tube several times every day to keep the tube clear. 4. Open the bulb cap or the drain plug. Do not touch the inside of the cap or the bottom of the plug. 5. Empty all of the drainage into the measuring cup. 6. Compress the bulb or the container and replace the cap or the plug. To compress the bulb or the container, squeeze it firmly in the middle  while you close the cap or plug the container. 7. Write down the amount of drainage that you have in each 24-hour period. If you have less than 2 Tbsp (30 mL) of drainage during 24 hours, contact your health care provider. 8. Flush the drainage down the toilet. 9. Wash your hands with soap and water. Contact a health care provider if:  You have more redness, swelling, or pain around your drain area.  The amount of drainage that you have is increasing instead of decreasing.  You have pus or a bad smell coming from your drain area.  You have a fever.  You have drainage that is cloudy.  There is a sudden stop or a sudden decrease in the amount of drainage that you have.  Your tube falls out.  Your active draindoes not stay compressedafter you empty it. Summary  Surgical drains are used to remove extra fluid that normally builds up in a surgical wound after surgery.  Different kinds of surgical drains include active drains and passive drains. Active drains use suction to pull drainage away from the surgical wound, and passive drains allow fluid to drain naturally.  It is important to care for your drain to prevent infection. If your drain is placed at your back, or any other hard-to-reach area, ask another person to assist you.  Contact your health care provider if you have more redness, swelling, or pain around your drain area. This information is not intended to replace advice given to you by your health care provider. Make sure you discuss any questions you have with your health care provider. Document Released: 12/10/2000 Document Revised: 01/05/2018 Document Reviewed: 07/02/2015 Elsevier Interactive Patient Education  2019 [place] Office Phone Number [phone]   POST OP INSTRUCTIONS  Always review your discharge instruction sheet given to you by the facility where your surgery was performed.  IF YOU HAVE DISABILITY OR FAMILY LEAVE  FORMS, YOU MUST BRING THEM TO THE OFFICE FOR PROCESSING.  DO NOT GIVE THEM TO YOUR DOCTOR.  1. A prescription for pain medication may be given to you upon discharge.  Take your pain medication as prescribed, if needed.  If narcotic pain medicine is not needed, then you may take acetaminophen (Tylenol) or ibuprofen (Advil) as needed. 2. Take your usually prescribed medications unless otherwise directed 3. If you need a refill on your pain medication, please contact your pharmacy.  They will contact our office to request authorization.  Prescriptions will not be filled after 5pm or on week-ends. 4. You should eat very light the first 24 hours after surgery, such as soup, crackers, pudding, etc.  Resume your normal diet the day after surgery 5. It is common to experience some constipation if taking pain medication after surgery.  Increasing fluid intake and taking a stool  softener will usually help or prevent this problem from occurring.  A mild laxative (Milk of Magnesia or Miralax) should be taken according to package directions if there are no bowel movements after 48 hours. 6. You may shower in 48 hours.  The surgical glue will flake off in 2-3 weeks.   7. ACTIVITIES:  No strenuous activity or heavy lifting for 1 week.   a. You may drive when you no longer are taking prescription pain medication, you can comfortably wear a seatbelt, and you can safely maneuver your car and apply brakes. b. RETURN TO WORK:  __________n/a_______________ Dennis Bast should see your doctor in the office for a follow-up appointment approximately three-four weeks after your surgery.    WHEN TO CALL YOUR DOCTOR: 1. Fever over 101.0 2. Nausea and/or vomiting. 3. Extreme swelling or bruising. 4. Continued bleeding from incision. 5. Increased pain, redness, or drainage from the incision.  The clinic staff is available to answer your questions during regular business hours.  Please dont hesitate to call and ask to speak to one of  the nurses for clinical concerns.  If you have a medical emergency, go to the nearest emergency room or call 911.  A surgeon from [facility] is always on call at the hospital.  For further questions, please visit [email or web address]     Post Anesthesia Home Care Instructions  Activity: Get plenty of rest for the remainder of the day. A responsible individual must stay with you for 24 hours following the procedure.  For the next 24 hours, DO NOT: -Drive a car -Paediatric nurse -Drink alcoholic beverages -Take any medication unless instructed by your physician -Make any legal decisions or sign important papers.  Meals: Start with liquid foods such as gelatin or soup. Progress to regular foods as tolerated. Avoid greasy, spicy, heavy foods. If nausea and/or vomiting occur, drink only clear liquids until the nausea and/or vomiting subsides. Call your physician if vomiting continues.  Special Instructions/Symptoms: Your throat may feel dry or sore from the anesthesia or the breathing tube placed in your throat during surgery. If this causes discomfort, gargle with warm salt water. The discomfort should disappear within 24 hours.  If you had a scopolamine patch placed behind your ear for the management of post- operative nausea and/or vomiting:  1. The medication in the patch is effective for 72 hours, after which it should be removed.  Wrap patch in a tissue and discard in the trash. Wash hands thoroughly with soap and water. 2. You may remove the patch earlier than 72 hours if you experience unpleasant side effects which may include dry mouth, dizziness or visual disturbances. 3. Avoid touching the patch. Wash your hands with soap and water after contact with the patch.

## 2019-03-21 NOTE — Anesthesia Postprocedure Evaluation (Signed)
Anesthesia Post Note  Patient: Jeffrey Hardin  Procedure(s) Performed: EXCISION OF LARGE RIGHT AXILLARY MASS (Right Axilla)     Patient location during evaluation: PACU Anesthesia Type: General Level of consciousness: awake and alert Pain management: pain level controlled Vital Signs Assessment: post-procedure vital signs reviewed and stable Respiratory status: spontaneous breathing, nonlabored ventilation and respiratory function stable Cardiovascular status: blood pressure returned to baseline and stable Postop Assessment: no apparent nausea or vomiting Anesthetic complications: no    Last Vitals:  Vitals:   03/21/19 1325 03/21/19 1345  BP:  124/68  Pulse: 63 62  Resp: 18 16  Temp:  36.4 C  SpO2: 98% 98%    Last Pain:  Vitals:   03/21/19 1345  TempSrc:   PainSc: 0-No pain                 Lynda Rainwater

## 2019-03-21 NOTE — Transfer of Care (Signed)
Immediate Anesthesia Transfer of Care Note  Patient: Jeffrey Hardin  Procedure(s) Performed: EXCISION OF LARGE RIGHT AXILLARY MASS (Right Axilla)  Patient Location: PACU  Anesthesia Type:General  Level of Consciousness: drowsy and patient cooperative  Airway & Oxygen Therapy: Patient Spontanous Breathing and Patient connected to face mask oxygen  Post-op Assessment: Report given to RN and Post -op Vital signs reviewed and stable  Post vital signs: Reviewed and stable  Last Vitals:  Vitals Value Taken Time  BP    Temp    Pulse 43 03/21/2019 12:32 PM  Resp    SpO2 100 % 03/21/2019 12:32 PM  Vitals shown include unvalidated device data.  Last Pain:  Vitals:   03/21/19 0915  TempSrc: Oral  PainSc: 6       Patients Stated Pain Goal: 3 (98/92/11 9417)  Complications: No apparent anesthesia complications

## 2019-03-21 NOTE — H&P (Signed)
Jeffrey Hardin Documented: 03/05/2019 1:48 PM Location: [facility] Patient #: [id number] DOB: 31-Jan-1934 Married / Language: Jeffrey Hardin / Race: White Male   History of Present Illness Jeffrey Klein MD; 03/05/2019 2:35 PM) The patient is a 85 year old male who presents with a diagnosis of cancer. Patient is a lovely 85 year old male who is referred by Dr. Alen Hardin for metastatic squamous cell carcinoma of the skin. He was first diagnosed in late 2018 from an ulcerated mass at the border of his hip disarticulation prosthesis. Dr. Iran Hardin excised this with negative margins in January 2019. In 2 months, he presented with palpable groin lymphadenopathy. Staging scans showed lymphadenopathy in the groin, the abdomen, and the right axilla, he has been on targeted therapy with Libtayo. Everything has gone back to normal size other than the right axilla. He continues to have a large necrotic lymph node in the right axilla that is approximately 6 cm in size. It is a bit sore.  Of note, he does need to use crutches for mobility when he is at home when he is not wearing his prosthesis. He is concerned about this. He does not want to do too much to interfere with this.  CT c/a/p 02/02/2019 IMPRESSION: Large right axillary nodal metastasis measuring 6.6 cm short axis, progressive.  No evidence of metastatic disease in the abdomen/pelvis. Postsurgical changes in the right inguinal region.  Additional stable ancillary findings as above.  surgical pathology 01/24/2018 Skin , Abdomen, Marjolin ulcer - INVASIVE SQUAMOUS CELL CARCINOMA, MODERATELY DIFFERENTIATED, SPANNING 8.8 CM. - THE SURGICAL RESECTION MARGINS ARE NEGATIVE FOR CARCINOMA.   Past Surgical History (Jeffrey Hardin, CMA; 03/05/2019 2:57 PM) Hip Surgery  Left.  Diagnostic Studies History (Jeffrey Hardin, CMA; 03/05/2019 2:57 PM) Colonoscopy  1-5 years ago  Allergies Jeffrey Hardin, [place]; 03/05/2019 2:11 PM) No Known Drug Allergies  [03/05/2019]: Allergies Reconciled   Medication History Jeffrey Hardin, CMA; 03/05/2019 2:12 PM) amLODIPine Besy-Benazepril HCl (10-20MG  Capsule, Oral) Active. Levothyroxine Sodium (100MCG Tablet, Oral) Active. Aspirin (81MG  Tablet, Oral) Active. Medications Reconciled  Other Problems (Jeffrey Hardin, CMA; 03/05/2019 2:57 PM) Hepatitis  Melanoma     Review of Systems (Jeffrey Hardin CMA; 03/05/2019 2:57 PM) General Present- Fatigue. Not Present- Appetite Loss, Chills, Fever, Night Sweats, Weight Gain and Weight Loss. Skin Present- Dryness, New Lesions and Non-Healing Wounds. Not Present- Change in Wart/Mole, Hives, Jaundice, Rash and Ulcer. Gastrointestinal Present- Change in Bowel Habits. Not Present- Abdominal Pain, Bloating, Bloody Stool, Chronic diarrhea, Constipation, Difficulty Swallowing, Excessive gas, Gets full quickly at meals, Hemorrhoids, Indigestion, Nausea, Rectal Pain and Vomiting. Male Genitourinary Not Present- Blood in Urine, Change in Urinary Stream, Frequency, Impotence, Nocturia, Painful Urination, Urgency and Urine Leakage. Musculoskeletal Present- Muscle Weakness. Not Present- Back Pain, Joint Pain, Joint Stiffness, Muscle Pain and Swelling of Extremities. Neurological Present- Trouble walking. Not Present- Decreased Memory, Fainting, Headaches, Numbness, Seizures, Tingling, Tremor and Weakness. Psychiatric Not Present- Anxiety, Bipolar, Change in Sleep Pattern, Depression, Fearful and Frequent crying. Endocrine Not Present- Cold Intolerance, Excessive Hunger, Hair Changes, Heat Intolerance, Hot flashes and New Diabetes. Hematology Not Present- Blood Thinners, Easy Bruising, Excessive bleeding, Gland problems, HIV and Persistent Infections. All other systems negative  Vitals Jeffrey Hardin CMA; 03/05/2019 2:10 PM) 03/05/2019 2:10 PM Weight: 143 lb Height: 68in Body Surface Area: 1.77 m Body Mass Index: 21.74 kg/m  Temp.: 97.68F  Pulse: 67 (Regular)   BP: 130/72 (Sitting, Left Arm, Standard)       Physical Exam Jeffrey Klein MD; 03/05/2019 2:39 PM) General  Mental Status-Alert. General Appearance-Consistent with stated age. Hydration-Well hydrated. Voice-Normal.  Head and Neck Head-normocephalic, atraumatic with no lesions or palpable masses. Trachea-midline. Thyroid Gland Characteristics - normal size and consistency.  Eye Eyeball - Bilateral-Extraocular movements intact. Sclera/Conjunctiva - Bilateral-No scleral icterus.  Chest and Lung Exam Chest and lung exam reveals -quiet, even and easy respiratory effort with no use of accessory muscles and on auscultation, normal breath sounds, no adventitious sounds and normal vocal resonance. Inspection Chest Wall - Normal. Back - normal.  Cardiovascular Cardiovascular examination reveals -normal heart sounds, regular rate and rhythm with no murmurs and normal pedal pulses bilaterally.  Abdomen Inspection Inspection of the abdomen reveals - No Hernias. Palpation/Percussion Palpation and Percussion of the abdomen reveal - Soft, Non Tender, No Rebound tenderness, No Rigidity (guarding) and No hepatosplenomegaly. Auscultation Auscultation of the abdomen reveals - Bowel sounds normal.  Neurologic Neurologic evaluation reveals -alert and oriented x 3 with no impairment of recent or remote memory. Mental Status-Normal.  Musculoskeletal Global Assessment -Note: no gross deformities.  Normal Exam - Left-Upper Extremity Strength Normal and Lower Extremity Strength Normal. Normal Exam - Right-Upper Extremity Strength Normal and Lower Extremity Strength Normal. Note: left leg/hip surgically absent.   Lymphatic Head & Neck  General Head & Neck Lymphatics: Bilateral - Description - Normal. Axillary  General Axillary Region: Bilateral - Description - Normal. Tenderness - Non Tender. Femoral & Inguinal  Generalized Femoral & Inguinal  Lymphatics: Bilateral - Description - No Generalized lymphadenopathy. Note: Large right axillary lymph node, mobile. sl tender. some serous skin drainage.     Assessment & Plan Jeffrey Klein MD; 03/05/2019 2:44 PM) METASTATIC SQUAMOUS CELL CARCINOMA TO LYMPH NODE (C77.9) Impression: I discussed the patient with Dr. Alen Hardin. Given the fact that he has had metastatic disease and other lymph node basins, I will just excise the large lymph node mass in the right axilla. If the nodes are enlarged at the time of surgery, I will excise as well. However, given the patient's age of 20 and critical need for mobility given his hip disarticulation, I would not recommend a full axillary lymph node dissection as it will not make him cancer free.  Patient and spouse were in agreement with this plan. He states that it is critical for him to maintain his ability to transfer his weight with elbow crutches and regular crutches. The less he is down, the better it will be for him to get back to his usual state of affairs.  I discussed the risk of surgery including bleeding, infection, damage to other structures, numbness, need for prolonged drain placement, possible arm swelling, possible blood clot. He understands and wishes to proceed. Current Plans Schedule for Surgery Pt Education - [facility] General Post-op HCI   Signed by Jeffrey Klein, MD (03/05/2019 4:22 PM)

## 2019-03-21 NOTE — Interval H&P Note (Signed)
History and Physical Interval Note:  03/21/2019 10:41 AM  Jeffrey Hardin  has presented today for surgery, with the diagnosis of METASTATIC SQUAMOUS CELL CANCER.  The various methods of treatment have been discussed with the patient and family. After consideration of risks, benefits and other options for treatment, the patient has consented to  Procedure(s): EXCISION OF DEEP RIGHT AXILLARY LYMPH NODES (Right) as a surgical intervention.  The patient's history has been reviewed, patient examined, no change in status, stable for surgery.  I have reviewed the patient's chart and labs.  Questions were answered to the patient's satisfaction.     Stark Klein

## 2019-03-22 ENCOUNTER — Encounter (HOSPITAL_BASED_OUTPATIENT_CLINIC_OR_DEPARTMENT_OTHER): Payer: Self-pay | Admitting: General Surgery

## 2019-04-18 ENCOUNTER — Inpatient Hospital Stay: Payer: Medicare Other

## 2019-04-18 ENCOUNTER — Inpatient Hospital Stay (HOSPITAL_BASED_OUTPATIENT_CLINIC_OR_DEPARTMENT_OTHER): Payer: Medicare Other | Admitting: Oncology

## 2019-04-18 ENCOUNTER — Other Ambulatory Visit: Payer: Self-pay

## 2019-04-18 ENCOUNTER — Inpatient Hospital Stay: Payer: Medicare Other | Attending: Oncology

## 2019-04-18 VITALS — BP 140/60 | HR 60 | Temp 98.2°F | Resp 17 | Ht 68.0 in | Wt 131.4 lb

## 2019-04-18 DIAGNOSIS — C778 Secondary and unspecified malignant neoplasm of lymph nodes of multiple regions: Secondary | ICD-10-CM | POA: Diagnosis not present

## 2019-04-18 DIAGNOSIS — C44599 Other specified malignant neoplasm of skin of other part of trunk: Secondary | ICD-10-CM

## 2019-04-18 DIAGNOSIS — C44509 Unspecified malignant neoplasm of skin of other part of trunk: Secondary | ICD-10-CM

## 2019-04-18 DIAGNOSIS — C4492 Squamous cell carcinoma of skin, unspecified: Secondary | ICD-10-CM

## 2019-04-18 DIAGNOSIS — Z5112 Encounter for antineoplastic immunotherapy: Secondary | ICD-10-CM | POA: Insufficient documentation

## 2019-04-18 DIAGNOSIS — Z79899 Other long term (current) drug therapy: Secondary | ICD-10-CM

## 2019-04-18 LAB — CBC WITH DIFFERENTIAL (CANCER CENTER ONLY)
Abs Immature Granulocytes: 0.01 10*3/uL (ref 0.00–0.07)
Basophils Absolute: 0.1 10*3/uL (ref 0.0–0.1)
Basophils Relative: 1 %
Eosinophils Absolute: 0.2 10*3/uL (ref 0.0–0.5)
Eosinophils Relative: 3 %
HCT: 38.9 % — ABNORMAL LOW (ref 39.0–52.0)
Hemoglobin: 12.9 g/dL — ABNORMAL LOW (ref 13.0–17.0)
Immature Granulocytes: 0 %
Lymphocytes Relative: 14 %
Lymphs Abs: 0.7 10*3/uL (ref 0.7–4.0)
MCH: 30.7 pg (ref 26.0–34.0)
MCHC: 33.2 g/dL (ref 30.0–36.0)
MCV: 92.6 fL (ref 80.0–100.0)
Monocytes Absolute: 0.6 10*3/uL (ref 0.1–1.0)
Monocytes Relative: 10 %
Neutro Abs: 4 10*3/uL (ref 1.7–7.7)
Neutrophils Relative %: 72 %
Platelet Count: 328 10*3/uL (ref 150–400)
RBC: 4.2 MIL/uL — ABNORMAL LOW (ref 4.22–5.81)
RDW: 14.7 % (ref 11.5–15.5)
WBC Count: 5.5 10*3/uL (ref 4.0–10.5)
nRBC: 0 % (ref 0.0–0.2)

## 2019-04-18 LAB — CMP (CANCER CENTER ONLY)
ALT: 11 U/L (ref 0–44)
AST: 16 U/L (ref 15–41)
Albumin: 3.2 g/dL — ABNORMAL LOW (ref 3.5–5.0)
Alkaline Phosphatase: 62 U/L (ref 38–126)
Anion gap: 8 (ref 5–15)
BUN: 12 mg/dL (ref 8–23)
CO2: 23 mmol/L (ref 22–32)
Calcium: 8.9 mg/dL (ref 8.9–10.3)
Chloride: 102 mmol/L (ref 98–111)
Creatinine: 0.76 mg/dL (ref 0.61–1.24)
GFR, Est AFR Am: >60 mL/min (ref >60)
GFR, Estimated: >60 mL/min (ref >60)
Glucose, Bld: 89 mg/dL (ref 70–99)
Potassium: 4.1 mmol/L (ref 3.5–5.1)
Sodium: 133 mmol/L — ABNORMAL LOW (ref 135–145)
Total Bilirubin: 0.6 mg/dL (ref 0.3–1.2)
Total Protein: 7 g/dL (ref 6.5–8.1)

## 2019-04-18 MED ORDER — SODIUM CHLORIDE 0.9 % IV SOLN
350.0000 mg | Freq: Once | INTRAVENOUS | Status: AC
  Administered 2019-04-18: 350 mg via INTRAVENOUS
  Filled 2019-04-18: qty 7

## 2019-04-18 MED ORDER — SODIUM CHLORIDE 0.9 % IV SOLN
Freq: Once | INTRAVENOUS | Status: AC
  Administered 2019-04-18: 11:00:00 via INTRAVENOUS
  Filled 2019-04-18: qty 250

## 2019-04-18 NOTE — Progress Notes (Signed)
Hematology and Oncology Follow Up Visit  Jeffrey Hardin [id number] [birth year] 85 y.o. 04/18/2019 9:29 AM Jeffrey Hardin, MDLalonde, Jeffrey Jarvis, Hardin   Principle Diagnosis: 85 year old man with squamous cell carcinoma of the skin with metastatic involvement in the axilla and inguinal adenopathy diagnosed in November 2018.     Prior Therapy: He underwent excision of a malignant lesion measuring 11 x 10 cm and layered closure completed on January 24, 2018 by Dr. Iran Planas.  The final pathology showed invasive squamous cell carcinoma with moderate differentiation measuring 8.8 cm with negative margins.    He is status post excision of a axillary mass completed by Dr. Barry Dienes on March 21, 2019.  Final pathology showed moderate to poorly differentiated squamous cell carcinoma.  Current therapy:  Libtayo 350 mg every 3 weeks started on 06/23/2018.  He completed 12 cycles of therapy on March 10 of 2020.  He is here for evaluation prior to next cycle.    Interim History: Jeffrey Hardin returns today for a follow-up.  Since the last visit, he underwent excision of his right axillary mass by Dr. Barry Dienes which showed moderately differentiated squamous cell carcinoma.  Since his surgery, he reports he is feeling well and has recovered reasonably well.  He reports no pain in his right axilla with increased range of motion.  He has lost weight but his appetite is improving without any major changes in his performance status or quality of life.  He denies any residual complications related to immune therapy.  He denies any change in his bowel habits including diarrhea or respiratory complaints.  He denied any alteration mental status, neuropathy, confusion or dizziness.  Denies any headaches or lethargy.  Denies any night sweats, weight loss or changes in appetite.  Denied orthopnea, dyspnea on exertion or chest discomfort.  Denies shortness of breath, difficulty breathing hemoptysis or cough.  Denies any abdominal  distention, nausea, early satiety or dyspepsia.  Denies any hematuria, frequency, dysuria or nocturia.  Denies any skin irritation, dryness or rash.  Denies any ecchymosis or petechiae.  Denies any lymphadenopathy or clotting.  Denies any heat or cold intolerance.  Denies any anxiety or depression.  Remaining review of system is negative.     Medications: I have reviewed the patient's current medications.  Current Outpatient Medications  Medication Sig Dispense Refill  . amLODipine-benazepril (LOTREL) 10-20 MG capsule TAKE 1 CAPSULE DAILY (Patient taking differently: Take 1 capsule by mouth daily. ) 90 capsule 4  . aspirin EC 81 MG tablet Take 81 mg by mouth daily.    . betamethasone dipropionate (DIPROLENE) 0.05 % ointment USE AS DIRECTED (Patient taking differently: Apply 1 application topically daily as needed (rash). USE AS DIRECTED) 45 g 5  . calcium carbonate (TUMS - DOSED IN MG ELEMENTAL CALCIUM) 500 MG chewable tablet Chew 2 tablets by mouth daily as needed for indigestion or heartburn.    . Glucosamine HCl (GLUCOSAMINE PO) Take 1 tablet by mouth daily.    Marland Kitchen levothyroxine (SYNTHROID, LEVOTHROID) 100 MCG tablet TAKE 1 TABLET DAILY (Patient taking differently: Take 100 mcg by mouth daily before breakfast. ) 90 tablet 4  . Multiple Vitamins-Minerals (MULTIVITAMIN WITH MINERALS) tablet Take 1 tablet by mouth daily.      Marland Kitchen oxyCODONE (OXY IR/ROXICODONE) 5 MG immediate release tablet Take 0.5-1 tablets (2.5-5 mg total) by mouth every 6 (six) hours as needed for severe pain. 10 tablet 0  . vitamin E 400 UNIT capsule Take 400 Units by mouth daily.  No current facility-administered medications for this visit.    Facility-Administered Medications Ordered in Other Visits  Medication Dose Route Frequency Provider Last Rate Last Dose  . influenza  inactive virus vaccine (FLUZONE/FLUARIX) injection 0.5 mL  0.5 mL Intramuscular Once Jeffrey Hardin         Allergies: No Known  Allergies  Past Medical History, Surgical history, Social history, and Family History remain unchanged on review.    Physical Exam:   Blood pressure 140/60, pulse 60, temperature 98.2 F (36.8 C), temperature source Oral, resp. rate 17, height 5\' 8"  (1.727 m), weight 131 lb 6.4 oz (59.6 kg), SpO2 100 %.     ECOG: 1    General appearance: Alert, awake without any distress. Head: Atraumatic without abnormalities Oropharynx: Without any thrush or ulcers. Eyes: No scleral icterus. Lymph nodes:  No axillary adenopathy noted.  Well-healed scar noted in his right axilla. Heart:regular rate and rhythm, without any murmurs or gallops.   Hardin: Clear to auscultation without any rhonchi, wheezes or dullness to percussion. Abdomin: Soft, nontender without any shifting dullness or ascites. Musculoskeletal: No clubbing or cyanosis. Neurological: No motor or sensory deficits. Skin: No rashes or lesions. Psychiatric: Mood and affect appeared normal.               Lab Results: Lab Results  Component Value Date   WBC 10.2 03/15/2019   HGB 12.6 (L) 03/15/2019   HCT 39.1 03/15/2019   MCV 93.3 03/15/2019   PLT 422 (H) 03/15/2019     Chemistry      Component Value Date/Time   NA 135 03/15/2019 0919   K 4.0 03/15/2019 0919   CL 108 03/15/2019 0919   CO2 22 03/15/2019 0919   BUN 11 03/15/2019 0919   CREATININE 0.79 03/15/2019 0919   CREATININE 0.72 03/06/2019 1014   CREATININE 0.63 (L) 01/05/2017 0934      Component Value Date/Time   CALCIUM 10.9 (H) 03/15/2019 0919   ALKPHOS 58 03/15/2019 0919   AST 20 03/15/2019 0919   AST 18 03/06/2019 1014   ALT 11 03/15/2019 0919   ALT 10 03/06/2019 1014   BILITOT 0.4 03/15/2019 0919   BILITOT 0.4 03/06/2019 1014         IMPRESSION: Large right axillary nodal metastasis measuring 6.6 cm short axis, progressive.  No evidence of metastatic disease in the abdomen/pelvis. Postsurgical changes in the right inguinal  region.  Additional stable ancillary findings as above.    Impression and Plan:  85 year old man with:  1.  Squamous cell carcinoma of the skin with metastatic disease to the inguinal and axillary lymph nodes.     He is status post surgical resection of right axillary mass and has completed 12 cycles of neoadjuvant immunotherapy.  Risks and benefits of continuing systemic treatment discussed today.  Long-term complications were reviewed and alternative therapy would be observation.  After discussion today, elected to continue with treatment at this time to ensure adequate response and consolidation of his complete response.  He will receive treatment today and will schedule 2 more cycles after that.     2.  IV access: No issues reported with his peripheral veins.  No Port-A-Cath is needed.  3.  Antiemetics: No nausea or vomiting reported at this time.  Antiemetics are available to him.  4.  Immune related complications: We continue to educate him about potential complications with this therapy.  This includes colitis, pneumonitis and thyroid disease.  He is not experiencing any  at this time.  5.  Goals of care and prognosis: Therapy potentially palliative at this time but aggressive therapy is warranted given his excellent performance status.  6.  Follow-up: We will be in 3 weeks for the next cycle of therapy.  25  minutes was spent with the patient face-to-face today.  More than 50% of time was dedicated to reviewing his disease status, treatment options and educating him about potential complications related to therapy.     [name], Hardin 4/22/20209:29 AM

## 2019-04-18 NOTE — Patient Instructions (Signed)
[facility] Discharge Instructions for Patients Receiving Chemotherapy  Today you received the following chemotherapy agents Libtayo  To help prevent nausea and vomiting after your treatment, we encourage you to take your nausea medication as directed.    If you develop nausea and vomiting that is not controlled by your nausea medication, call the clinic.   BELOW ARE SYMPTOMS THAT SHOULD BE REPORTED IMMEDIATELY:  *FEVER GREATER THAN 100.5 F  *CHILLS WITH OR WITHOUT FEVER  NAUSEA AND VOMITING THAT IS NOT CONTROLLED WITH YOUR NAUSEA MEDICATION  *UNUSUAL SHORTNESS OF BREATH  *UNUSUAL BRUISING OR BLEEDING  TENDERNESS IN MOUTH AND THROAT WITH OR WITHOUT PRESENCE OF ULCERS  *URINARY PROBLEMS  *BOWEL PROBLEMS  UNUSUAL RASH Items with * indicate a potential emergency and should be followed up as soon as possible.  Feel free to call the clinic should you have any questions or concerns. The clinic phone number is [phone].  Please show the CHEMO ALERT CARD at check-in to the Emergency Department and triage nurse.   

## 2019-04-19 ENCOUNTER — Telehealth: Payer: Self-pay | Admitting: Oncology

## 2019-04-19 NOTE — Telephone Encounter (Signed)
Scheduled appts per sch msg. Called and spoke with patient. Confirmed dates and times.  °

## 2019-05-09 ENCOUNTER — Inpatient Hospital Stay: Payer: Medicare Other | Attending: Oncology | Admitting: Oncology

## 2019-05-09 ENCOUNTER — Inpatient Hospital Stay: Payer: Medicare Other

## 2019-05-09 ENCOUNTER — Other Ambulatory Visit: Payer: Self-pay

## 2019-05-09 VITALS — BP 127/77 | HR 60 | Temp 98.0°F | Resp 17 | Ht 68.0 in | Wt 133.7 lb

## 2019-05-09 DIAGNOSIS — C778 Secondary and unspecified malignant neoplasm of lymph nodes of multiple regions: Secondary | ICD-10-CM | POA: Diagnosis not present

## 2019-05-09 DIAGNOSIS — C4492 Squamous cell carcinoma of skin, unspecified: Secondary | ICD-10-CM | POA: Diagnosis not present

## 2019-05-09 DIAGNOSIS — Z5112 Encounter for antineoplastic immunotherapy: Secondary | ICD-10-CM | POA: Insufficient documentation

## 2019-05-09 DIAGNOSIS — C44599 Other specified malignant neoplasm of skin of other part of trunk: Secondary | ICD-10-CM

## 2019-05-09 DIAGNOSIS — C44509 Unspecified malignant neoplasm of skin of other part of trunk: Secondary | ICD-10-CM

## 2019-05-09 LAB — CMP (CANCER CENTER ONLY)
ALT: 12 U/L (ref 0–44)
AST: 15 U/L (ref 15–41)
Albumin: 3.2 g/dL — ABNORMAL LOW (ref 3.5–5.0)
Alkaline Phosphatase: 55 U/L (ref 38–126)
Anion gap: 10 (ref 5–15)
BUN: 11 mg/dL (ref 8–23)
CO2: 22 mmol/L (ref 22–32)
Calcium: 9 mg/dL (ref 8.9–10.3)
Chloride: 103 mmol/L (ref 98–111)
Creatinine: 0.75 mg/dL (ref 0.61–1.24)
GFR, Est AFR Am: >60 mL/min (ref >60)
GFR, Estimated: >60 mL/min (ref >60)
Glucose, Bld: 96 mg/dL (ref 70–99)
Potassium: 4 mmol/L (ref 3.5–5.1)
Sodium: 135 mmol/L (ref 135–145)
Total Bilirubin: 0.6 mg/dL (ref 0.3–1.2)
Total Protein: 6.7 g/dL (ref 6.5–8.1)

## 2019-05-09 LAB — CBC WITH DIFFERENTIAL (CANCER CENTER ONLY)
Abs Immature Granulocytes: 0.01 10*3/uL (ref 0.00–0.07)
Basophils Absolute: 0.1 10*3/uL (ref 0.0–0.1)
Basophils Relative: 1 %
Eosinophils Absolute: 0.2 10*3/uL (ref 0.0–0.5)
Eosinophils Relative: 3 %
HCT: 41.4 % (ref 39.0–52.0)
Hemoglobin: 13.7 g/dL (ref 13.0–17.0)
Immature Granulocytes: 0 %
Lymphocytes Relative: 18 %
Lymphs Abs: 0.9 10*3/uL (ref 0.7–4.0)
MCH: 31.2 pg (ref 26.0–34.0)
MCHC: 33.1 g/dL (ref 30.0–36.0)
MCV: 94.3 fL (ref 80.0–100.0)
Monocytes Absolute: 0.6 10*3/uL (ref 0.1–1.0)
Monocytes Relative: 13 %
Neutro Abs: 3.2 10*3/uL (ref 1.7–7.7)
Neutrophils Relative %: 65 %
Platelet Count: 294 10*3/uL (ref 150–400)
RBC: 4.39 MIL/uL (ref 4.22–5.81)
RDW: 14.6 % (ref 11.5–15.5)
WBC Count: 5 10*3/uL (ref 4.0–10.5)
nRBC: 0 % (ref 0.0–0.2)

## 2019-05-09 LAB — TSH: TSH: 1.218 u[IU]/mL (ref 0.320–4.118)

## 2019-05-09 MED ORDER — SODIUM CHLORIDE 0.9 % IV SOLN
Freq: Once | INTRAVENOUS | Status: AC
  Administered 2019-05-09: 09:00:00 via INTRAVENOUS
  Filled 2019-05-09: qty 250

## 2019-05-09 MED ORDER — SODIUM CHLORIDE 0.9 % IV SOLN
350.0000 mg | Freq: Once | INTRAVENOUS | Status: AC
  Administered 2019-05-09: 10:00:00 350 mg via INTRAVENOUS
  Filled 2019-05-09: qty 7

## 2019-05-09 NOTE — Patient Instructions (Signed)
[facility] Discharge Instructions for Patients Receiving Chemotherapy  Today you received the following chemotherapy agents Libtayo  To help prevent nausea and vomiting after your treatment, we encourage you to take your nausea medication as directed.    If you develop nausea and vomiting that is not controlled by your nausea medication, call the clinic.   BELOW ARE SYMPTOMS THAT SHOULD BE REPORTED IMMEDIATELY:  *FEVER GREATER THAN 100.5 F  *CHILLS WITH OR WITHOUT FEVER  NAUSEA AND VOMITING THAT IS NOT CONTROLLED WITH YOUR NAUSEA MEDICATION  *UNUSUAL SHORTNESS OF BREATH  *UNUSUAL BRUISING OR BLEEDING  TENDERNESS IN MOUTH AND THROAT WITH OR WITHOUT PRESENCE OF ULCERS  *URINARY PROBLEMS  *BOWEL PROBLEMS  UNUSUAL RASH Items with * indicate a potential emergency and should be followed up as soon as possible.  Feel free to call the clinic should you have any questions or concerns. The clinic phone number is [phone].  Please show the CHEMO ALERT CARD at check-in to the Emergency Department and triage nurse.   

## 2019-05-09 NOTE — Progress Notes (Signed)
Hematology and Oncology Follow Up Visit  Jeffrey Hardin [id number] April 09, 1934 85 y.o. 05/09/2019 8:43 AM Jeffrey Hardin, MDLalonde, Jeffrey Jarvis, MD   Principle Diagnosis: 85 year old man with stage IV squamous cell carcinoma of the skin with axilla and inguinal adenopathy diagnosed in November 2018.     Prior Therapy: He underwent excision of a malignant lesion measuring 11 x 10 cm and layered closure completed on January 24, 2018 by Dr. Iran Planas.  The final pathology showed invasive squamous cell carcinoma with moderate differentiation measuring 8.8 cm with negative margins.    He is status post excision of a axillary mass completed by Dr. Barry Dienes on March 21, 2019.  Final pathology showed moderate to poorly differentiated squamous cell carcinoma.  Current therapy:  Libtayo 350 mg every 3 weeks started on 06/23/2018.  He completed 12 cycles of therapy on March 10 of 2020.  He is status post 13 cycles of therapy and here for cycle 14 planned 15 total.    Interim History: Jeffrey Hardin is here for a repeat evaluation.  Since the last visit, he continues to recover well from his surgery in March 2020.  He has improved his range of motion in his right arm without any pain or limitations.  He denies any neuropathy or fevers.  He denies any complications related to immunotherapy.  Denies any skin rashes or changes in his bowel habits.  His performance status and quality of life remain excellent.   Patient denied headaches, blurry vision, syncope or seizures.  Denies any fevers, chills or sweats.  Denied chest pain, palpitation, orthopnea or leg edema.  Denied cough, wheezing or hemoptysis.  Denied nausea, vomiting or abdominal pain.  Denies any constipation or diarrhea.  Denies any frequency urgency or hesitancy.  Denies any arthralgias or myalgias.  Denies any skin rashes or lesions.  Denies any bleeding or clotting tendency.  Denies any easy bruising.  Denies any hair or nail changes.  Denies any anxiety or  depression.  Remaining review of system is negative.       Medications: I have reviewed the patient's current medications.  Current Outpatient Medications  Medication Sig Dispense Refill  . amLODipine-benazepril (LOTREL) 10-20 MG capsule TAKE 1 CAPSULE DAILY (Patient taking differently: Take 1 capsule by mouth daily. ) 90 capsule 4  . aspirin EC 81 MG tablet Take 81 mg by mouth daily.    . betamethasone dipropionate (DIPROLENE) 0.05 % ointment USE AS DIRECTED (Patient taking differently: Apply 1 application topically daily as needed (rash). USE AS DIRECTED) 45 g 5  . calcium carbonate (TUMS - DOSED IN MG ELEMENTAL CALCIUM) 500 MG chewable tablet Chew 2 tablets by mouth daily as needed for indigestion or heartburn.    . Glucosamine HCl (GLUCOSAMINE PO) Take 1 tablet by mouth daily.    Marland Kitchen levothyroxine (SYNTHROID, LEVOTHROID) 100 MCG tablet TAKE 1 TABLET DAILY (Patient taking differently: Take 100 mcg by mouth daily before breakfast. ) 90 tablet 4  . Multiple Vitamins-Minerals (MULTIVITAMIN WITH MINERALS) tablet Take 1 tablet by mouth daily.      Marland Kitchen oxyCODONE (OXY IR/ROXICODONE) 5 MG immediate release tablet Take 0.5-1 tablets (2.5-5 mg total) by mouth every 6 (six) hours as needed for severe pain. 10 tablet 0  . vitamin E 400 UNIT capsule Take 400 Units by mouth daily.     No current facility-administered medications for this visit.    Facility-Administered Medications Ordered in Other Visits  Medication Dose Route Frequency Provider Last Rate Last Dose  .  influenza  inactive virus vaccine (FLUZONE/FLUARIX) injection 0.5 mL  0.5 mL Intramuscular Once Jeffrey Lung, MD         Allergies: No Known Allergies  Past Medical History, Surgical history, Social history, and Family History remain unchanged on review.    Physical Exam:   Blood pressure 127/77, pulse 60, temperature 98 F (36.7 C), temperature source Oral, resp. rate 17, height 5\' 8"  (1.727 m), weight 133 lb 11.2 oz (60.6  kg), SpO2 100 %.     ECOG: 1   General appearance: Comfortable appearing without any discomfort Head: Normocephalic without any trauma Oropharynx: Mucous membranes are moist and pink without any thrush or ulcers. Eyes: Pupils are equal and round reactive to light. Lymph nodes: No cervical, supraclavicular, inguinal or axillary lymphadenopathy.   Heart:regular rate and rhythm.  S1 and S2 without leg edema. Hardin: Clear without any rhonchi or wheezes.  No dullness to percussion. Abdomin: Soft, nontender, nondistended with good bowel sounds.  No hepatosplenomegaly. Musculoskeletal: No joint deformity or effusion.  Full range of motion noted. Neurological: No deficits noted on motor, sensory and deep tendon reflex exam. Skin: No petechial rash or dryness.  Appeared moist.                Lab Results: Lab Results  Component Value Date   WBC 5.0 05/09/2019   HGB 13.7 05/09/2019   HCT 41.4 05/09/2019   MCV 94.3 05/09/2019   PLT 294 05/09/2019     Chemistry      Component Value Date/Time   NA 133 (L) 04/18/2019 0917   K 4.1 04/18/2019 0917   CL 102 04/18/2019 0917   CO2 23 04/18/2019 0917   BUN 12 04/18/2019 0917   CREATININE 0.76 04/18/2019 0917   CREATININE 0.63 (L) 01/05/2017 0934      Component Value Date/Time   CALCIUM 8.9 04/18/2019 0917   ALKPHOS 62 04/18/2019 0917   AST 16 04/18/2019 0917   ALT 11 04/18/2019 0917   BILITOT 0.6 04/18/2019 0917            Impression and Plan:  85 year old man with:  1.  Stage IV squamous cell carcinoma of the skin with inguinal and axillary lymph node involvement.  He remains on Libtayo without any major complications.  He received 12 cycles of neoadjuvant treatment followed by surgical resection of his axillary lymph node disease.  Risks and benefits of continuing this therapy was discussed today and the plan is to proceed with cycle 14 and 15 of therapy and then proceed with a treatment break.  He is  agreeable to proceed at this time.  We will plan to repeat imaging studies in the future for staging purposes.     2.  IV access: No issues with peripheral veins noted at this time.  3.  Antiemetics: No nausea or vomiting reported at this time with antiemetics available to him.  4.  Immune related complications: No complications noted at this time.  I reiterated some of the side effects including thyroid disease, colitis, pneumonitis as well as dermatitis.  5.  Goals of care and prognosis: His disease is incurable but aggressive therapy is warranted given his excellent performance status.  6.  Follow-up: In 3 weeks for the last cycle of therapy..  25  minutes was spent with the patient face-to-face today.  More than 50% of time was spent on reviewing his disease status, treatment options and answering questions regarding future plan of care.  [name], MD 5/13/20208:43 AM

## 2019-05-30 ENCOUNTER — Other Ambulatory Visit: Payer: Self-pay

## 2019-05-30 ENCOUNTER — Inpatient Hospital Stay: Payer: Medicare Other

## 2019-05-30 ENCOUNTER — Inpatient Hospital Stay: Payer: Medicare Other | Attending: Oncology | Admitting: Oncology

## 2019-05-30 ENCOUNTER — Telehealth: Payer: Self-pay | Admitting: Oncology

## 2019-05-30 VITALS — BP 133/69 | HR 58 | Temp 98.7°F | Resp 17 | Ht 68.0 in | Wt 133.7 lb

## 2019-05-30 DIAGNOSIS — C44509 Unspecified malignant neoplasm of skin of other part of trunk: Secondary | ICD-10-CM

## 2019-05-30 DIAGNOSIS — C778 Secondary and unspecified malignant neoplasm of lymph nodes of multiple regions: Secondary | ICD-10-CM | POA: Insufficient documentation

## 2019-05-30 DIAGNOSIS — C4492 Squamous cell carcinoma of skin, unspecified: Secondary | ICD-10-CM

## 2019-05-30 DIAGNOSIS — Z5112 Encounter for antineoplastic immunotherapy: Secondary | ICD-10-CM | POA: Insufficient documentation

## 2019-05-30 DIAGNOSIS — C44599 Other specified malignant neoplasm of skin of other part of trunk: Secondary | ICD-10-CM

## 2019-05-30 DIAGNOSIS — Z79899 Other long term (current) drug therapy: Secondary | ICD-10-CM

## 2019-05-30 LAB — CBC WITH DIFFERENTIAL (CANCER CENTER ONLY)
Abs Immature Granulocytes: 0.01 10*3/uL (ref 0.00–0.07)
Basophils Absolute: 0.1 10*3/uL (ref 0.0–0.1)
Basophils Relative: 1 %
Eosinophils Absolute: 0.1 10*3/uL (ref 0.0–0.5)
Eosinophils Relative: 2 %
HCT: 41.8 % (ref 39.0–52.0)
Hemoglobin: 14.1 g/dL (ref 13.0–17.0)
Immature Granulocytes: 0 %
Lymphocytes Relative: 16 %
Lymphs Abs: 1 10*3/uL (ref 0.7–4.0)
MCH: 30.5 pg (ref 26.0–34.0)
MCHC: 33.7 g/dL (ref 30.0–36.0)
MCV: 90.3 fL (ref 80.0–100.0)
Monocytes Absolute: 0.7 10*3/uL (ref 0.1–1.0)
Monocytes Relative: 12 %
Neutro Abs: 4.1 10*3/uL (ref 1.7–7.7)
Neutrophils Relative %: 69 %
Platelet Count: 307 10*3/uL (ref 150–400)
RBC: 4.63 MIL/uL (ref 4.22–5.81)
RDW: 13.2 % (ref 11.5–15.5)
WBC Count: 6 10*3/uL (ref 4.0–10.5)
nRBC: 0 % (ref 0.0–0.2)

## 2019-05-30 LAB — CMP (CANCER CENTER ONLY)
ALT: 9 U/L (ref 0–44)
AST: 17 U/L (ref 15–41)
Albumin: 3.2 g/dL — ABNORMAL LOW (ref 3.5–5.0)
Alkaline Phosphatase: 56 U/L (ref 38–126)
Anion gap: 10 (ref 5–15)
BUN: 8 mg/dL (ref 8–23)
CO2: 18 mmol/L — ABNORMAL LOW (ref 22–32)
Calcium: 8.5 mg/dL — ABNORMAL LOW (ref 8.9–10.3)
Chloride: 101 mmol/L (ref 98–111)
Creatinine: 0.7 mg/dL (ref 0.61–1.24)
GFR, Est AFR Am: >60 mL/min (ref >60)
GFR, Estimated: >60 mL/min (ref >60)
Glucose, Bld: 83 mg/dL (ref 70–99)
Potassium: 4.2 mmol/L (ref 3.5–5.1)
Sodium: 129 mmol/L — ABNORMAL LOW (ref 135–145)
Total Bilirubin: 0.4 mg/dL (ref 0.3–1.2)
Total Protein: 6.4 g/dL — ABNORMAL LOW (ref 6.5–8.1)

## 2019-05-30 MED ORDER — SODIUM CHLORIDE 0.9 % IV SOLN
Freq: Once | INTRAVENOUS | Status: AC
  Administered 2019-05-30: 14:00:00 via INTRAVENOUS
  Filled 2019-05-30: qty 250

## 2019-05-30 MED ORDER — SODIUM CHLORIDE 0.9 % IV SOLN
350.0000 mg | Freq: Once | INTRAVENOUS | Status: AC
  Administered 2019-05-30: 350 mg via INTRAVENOUS
  Filled 2019-05-30: qty 7

## 2019-05-30 NOTE — Progress Notes (Signed)
Hematology and Oncology Follow Up Visit  Jeffrey Hardin [id number] Jul 30, 1934 85 y.o. 05/30/2019 12:44 PM Jeffrey Hardin, MDLalonde, Jeffrey Jarvis, Hardin   Principle Diagnosis: 86 year old man with squamous cell carcinoma of the skin with metastatic disease in the axilla as well as inguinal area diagnosed in November 2018.     Prior Therapy: He underwent excision of a malignant lesion measuring 11 x 10 cm and layered closure completed on January 24, 2018 by Dr. Iran Planas.  The final pathology showed invasive squamous cell carcinoma with moderate differentiation measuring 8.8 cm with negative margins.    He is status post excision of a axillary mass completed by Dr. Barry Dienes on March 21, 2019.  Final pathology showed moderate to poorly differentiated squamous cell carcinoma.  Current therapy:  Libtayo 350 mg every 3 weeks started on 06/23/2018.  He completed 12 cycles of therapy on March 10 of 2020.  He is here for cycle 15 of therapy.    Interim History: Jeffrey Hardin is here for a follow-up visit.  Since the last visit, he reports no major changes in his health.  He continues to tolerate current therapy without any recent issues.  He denies excessive fatigue, tiredness, skin rash or pruritus.  He does report some alteration in his taste and occasional dry mouth but has been manageable and mostly resolved.  His range of motion in his right shoulder has improved and close to normal.  He denies any painful or palpable adenopathy.  He denied any alteration mental status, neuropathy, confusion or dizziness.  Denies any night sweats, weight loss or changes in appetite.  Denied orthopnea, dyspnea on exertion or chest discomfort.  Denies shortness of breath, difficulty breathing hemoptysis or cough.  Denies any abdominal distention, nausea, early satiety or dyspepsia.  Denies any hematuria, frequency, dysuria or nocturia.  Denies any skin irritation, dryness or rash.  Denies any ecchymosis or petechiae.  Denies any  lymphadenopathy or clotting.  Denies any heat or cold intolerance.  Denies any anxiety or depression.  Remaining review of system is negative.         Medications: I have reviewed the patient's current medications.  Current Outpatient Medications  Medication Sig Dispense Refill  . amLODipine-benazepril (LOTREL) 10-20 MG capsule TAKE 1 CAPSULE DAILY (Patient taking differently: Take 1 capsule by mouth daily. ) 90 capsule 4  . aspirin EC 81 MG tablet Take 81 mg by mouth daily.    . betamethasone dipropionate (DIPROLENE) 0.05 % ointment USE AS DIRECTED (Patient taking differently: Apply 1 application topically daily as needed (rash). USE AS DIRECTED) 45 g 5  . calcium carbonate (TUMS - DOSED IN MG ELEMENTAL CALCIUM) 500 MG chewable tablet Chew 2 tablets by mouth daily as needed for indigestion or heartburn.    . Glucosamine HCl (GLUCOSAMINE PO) Take 1 tablet by mouth daily.    Marland Kitchen levothyroxine (SYNTHROID, LEVOTHROID) 100 MCG tablet TAKE 1 TABLET DAILY (Patient taking differently: Take 100 mcg by mouth daily before breakfast. ) 90 tablet 4  . Multiple Vitamins-Minerals (MULTIVITAMIN WITH MINERALS) tablet Take 1 tablet by mouth daily.      Marland Kitchen oxyCODONE (OXY IR/ROXICODONE) 5 MG immediate release tablet Take 0.5-1 tablets (2.5-5 mg total) by mouth every 6 (six) hours as needed for severe pain. 10 tablet 0  . vitamin E 400 UNIT capsule Take 400 Units by mouth daily.     No current facility-administered medications for this visit.    Facility-Administered Medications Ordered in Other Visits  Medication Dose  Route Frequency Provider Last Rate Last Dose  . influenza  inactive virus vaccine (FLUZONE/FLUARIX) injection 0.5 mL  0.5 mL Intramuscular Once Jeffrey Hardin         Allergies: No Known Allergies  Past Medical History, Surgical history, Social history, and Family History remain unchanged on review.    Physical Exam:   Blood pressure 133/69, pulse (!) 58, temperature 98.7 F (37.1  C), temperature source Oral, resp. rate 17, height 5\' 8"  (1.727 m), weight 133 lb 11.2 oz (60.6 kg), SpO2 98 %.      ECOG: 1      General appearance: Alert, awake without any distress. Head: Atraumatic without abnormalities Oropharynx: Without any thrush or ulcers. Eyes: No scleral icterus. Lymph nodes: No lymphadenopathy noted in the cervical, supraclavicular, or axillary nodes Heart:regular rate and rhythm, without any murmurs or gallops.   Hardin: Clear to auscultation without any rhonchi, wheezes or dullness to percussion. Abdomin: Soft, nontender without any shifting dullness or ascites. Musculoskeletal: No clubbing or cyanosis. Neurological: No motor or sensory deficits. Skin: No rashes or lesions.                Lab Results: Lab Results  Component Value Date   WBC 5.0 05/09/2019   HGB 13.7 05/09/2019   HCT 41.4 05/09/2019   MCV 94.3 05/09/2019   PLT 294 05/09/2019     Chemistry      Component Value Date/Time   NA 135 05/09/2019 0813   K 4.0 05/09/2019 0813   CL 103 05/09/2019 0813   CO2 22 05/09/2019 0813   BUN 11 05/09/2019 0813   CREATININE 0.75 05/09/2019 0813   CREATININE 0.63 (L) 01/05/2017 0934      Component Value Date/Time   CALCIUM 9.0 05/09/2019 0813   ALKPHOS 55 05/09/2019 0813   AST 15 05/09/2019 0813   ALT 12 05/09/2019 0813   BILITOT 0.6 05/09/2019 0813             Impression and Plan:  85 year old man with:  1.  Squamous cell carcinoma of the skin with involvement of the axilla and inguinal region diagnosed in 2018.   He continues to tolerate Libtayo without any new side effects.  Risks and benefits of continuing this therapy was discussed today.  Potential long-term complications associated with immunotherapy were reiterated.  The plan is to proceed with treatment today with consideration of a treatment break after that.  He is agreeable to proceed with this plan and will resume salvage therapy if he develops  symptomatic progression.    2.  IV access: Peripheral veins remain in use without any recent issues.  Is no objection to continuing this approach.  3.  Antiemetics: Antiemetics are available to him.  He has not reported any new nausea or vomiting.  4.  Immune related complications: Long-term complication associated to immunotherapy such as thyroid disease, colitis, pneumonitis among others were reiterated.  He is not experiencing any major issues at this time.  5.  Goals of care and prognosis: Therapy remains palliative at this time although aggressive measures are warranted given his excellent performance status.  6.  Follow-up: In 3 months for repeat evaluation.  25  minutes was spent with the patient face-to-face today.  More than 50% of time was dedicated to discussing the natural course of his disease, treatment options, complications related to therapy and answer questions regarding future follow-up.       [name], Hardin 6/3/202012:44 PM

## 2019-05-30 NOTE — Telephone Encounter (Signed)
Scheduled appt per sch msg. Called and left msg. Mailed printout °

## 2019-05-30 NOTE — Patient Instructions (Signed)
[facility] Discharge Instructions for Patients Receiving Chemotherapy  Today you received the following chemotherapy agents Cemiplimab-rwlc (LIBTAYO).  To help prevent nausea and vomiting after your treatment, we encourage you to take your nausea medication as prescribed.   If you develop nausea and vomiting that is not controlled by your nausea medication, call the clinic.   BELOW ARE SYMPTOMS THAT SHOULD BE REPORTED IMMEDIATELY:  *FEVER GREATER THAN 100.5 F  *CHILLS WITH OR WITHOUT FEVER  NAUSEA AND VOMITING THAT IS NOT CONTROLLED WITH YOUR NAUSEA MEDICATION  *UNUSUAL SHORTNESS OF BREATH  *UNUSUAL BRUISING OR BLEEDING  TENDERNESS IN MOUTH AND THROAT WITH OR WITHOUT PRESENCE OF ULCERS  *URINARY PROBLEMS  *BOWEL PROBLEMS  UNUSUAL RASH Items with * indicate a potential emergency and should be followed up as soon as possible.  Feel free to call the clinic should you have any questions or concerns. The clinic phone number is [phone].  Please show the [facility] at check-in to the Emergency Department and triage nurse.  Coronavirus (COVID-19) Are you at risk?  Are you at risk for the Coronavirus (COVID-19)?  To be considered HIGH RISK for Coronavirus (COVID-19), you have to meet the following criteria:  . Traveled to [place], [place], [place], [place] or [place]; or in the [place] to [place], [place], [place], or [place]; and have fever, cough, and shortness of breath within the last 2 weeks of travel OR . Been in close contact with a person diagnosed with COVID-19 within the last 2 weeks and have fever, cough, and shortness of breath . IF YOU DO NOT MEET THESE CRITERIA, YOU ARE CONSIDERED LOW RISK FOR COVID-19.  What to do if you are HIGH RISK for COVID-19?  Marland Kitchen If you are having a medical emergency, call 911. . Seek medical care right away. Before you go to a doctor's office, urgent care or emergency department, call ahead and tell  them about your recent travel, contact with someone diagnosed with COVID-19, and your symptoms. You should receive instructions from your physician's office regarding next steps of care.  . When you arrive at healthcare provider, tell the healthcare staff immediately you have returned from visiting [place], [place], [place], [place] or [place]; or traveled in the [place] to [place], [place], [place], or [place]; in the last two weeks or you have been in close contact with a person diagnosed with COVID-19 in the last 2 weeks.   . Tell the health care staff about your symptoms: fever, cough and shortness of breath. . After you have been seen by a medical provider, you will be either: o Tested for (COVID-19) and discharged home on quarantine except to seek medical care if symptoms worsen, and asked to  - Stay home and avoid contact with others until you get your results (4-5 days)  - Avoid travel on public transportation if possible (such as bus, train, or airplane) or o Sent to the Emergency Department by EMS for evaluation, COVID-19 testing, and possible admission depending on your condition and test results.  What to do if you are LOW RISK for COVID-19?  Reduce your risk of any infection by using the same precautions used for avoiding the common cold or flu:  Marland Kitchen Wash your hands often with soap and warm water for at least 20 seconds.  If soap and water are not readily available, use an alcohol-based hand sanitizer with at least 60% alcohol.  . If coughing or  sneezing, cover your mouth and nose by coughing or sneezing into the elbow areas of your shirt or coat, into a tissue or into your sleeve (not your hands). . Avoid shaking hands with others and consider head nods or verbal greetings only. . Avoid touching your eyes, nose, or mouth with unwashed hands.  . Avoid close contact with people who are sick. . Avoid places or events with large numbers of people in one location, like concerts or  sporting events. . Carefully consider travel plans you have or are making. . If you are planning any travel outside or inside the [place], visit the CDC's Travelers' Health webpage for the latest health notices. . If you have some symptoms but not all symptoms, continue to monitor at home and seek medical attention if your symptoms worsen. . If you are having a medical emergency, call 911.   [facility] / e-Visit: [email or web address]         [facility]: [facility]: [phone]                   [facility]: [phone]

## 2019-08-30 ENCOUNTER — Ambulatory Visit: Payer: Medicare Other | Admitting: Oncology

## 2019-09-07 ENCOUNTER — Other Ambulatory Visit: Payer: Self-pay

## 2019-09-07 ENCOUNTER — Inpatient Hospital Stay: Payer: Medicare Other | Attending: Oncology | Admitting: Oncology

## 2019-09-07 VITALS — BP 124/61 | HR 62 | Temp 98.9°F | Resp 17 | Ht 68.0 in | Wt 128.2 lb

## 2019-09-07 DIAGNOSIS — C4492 Squamous cell carcinoma of skin, unspecified: Secondary | ICD-10-CM

## 2019-09-07 DIAGNOSIS — C778 Secondary and unspecified malignant neoplasm of lymph nodes of multiple regions: Secondary | ICD-10-CM | POA: Diagnosis not present

## 2019-09-07 DIAGNOSIS — Z79899 Other long term (current) drug therapy: Secondary | ICD-10-CM | POA: Diagnosis not present

## 2019-09-07 NOTE — Progress Notes (Signed)
Hematology and Oncology Follow Up Visit  Jeffrey Hardin [id number] 1934/07/17 85 y.o. 09/07/2019 3:06 PM [name], MDLalonde, [name], MD   Principle Diagnosis: 85 year old man with advanced squamous cell carcinoma of the skin diagnosed in November 2018.  He has lymph node involvement in the axilla and the inguinal lymph nodes.  Prior Therapy: He underwent excision of a malignant lesion measuring 11 x 10 cm and layered closure completed on January 24, 2018 by Dr. Iran Planas.  The final pathology showed invasive squamous cell carcinoma with moderate differentiation measuring 8.8 cm with negative margins.    He is status post excision of a axillary mass completed by Dr. Barry Dienes on March 21, 2019.  Final pathology showed moderate to poorly differentiated squamous cell carcinoma.  Libtayo 350 mg every 3 weeks started on 06/23/2018.  He completed 12 cycles of therapy on March 10 of 2020.  Therapy concluded in June 2020.  Current therapy: Active surveillance.      Interim History: Jeffrey Hardin returns today for a repeat evaluation.  Since the last visit, he reports no changes in his health.  He has fully recovered from his surgery and systemic treatment without any signs or symptoms of disease progression.  He denies any lymphadenopathy or constitutional symptoms.  He did have oral ulcers related to prosthetic teeth.  He did have some weight loss related to it but recovering at this time.  His performance status and quality of life remain excellent.  Patient denied headaches, blurry vision, syncope or seizures.  Denies any fevers, chills or sweats.  Denied chest pain, palpitation, orthopnea or leg edema.  Denied cough, wheezing or hemoptysis.  Denied nausea, vomiting or abdominal pain.  Denies any constipation or diarrhea.  Denies any frequency urgency or hesitancy.  Denies any arthralgias or myalgias.  Denies any skin rashes or lesions.  Denies any bleeding or clotting tendency.  Denies any easy  bruising.  Denies any hair or nail changes.  Denies any anxiety or depression.  Remaining review of system is negative.            Medications: Updated on review. Current Outpatient Medications  Medication Sig Dispense Refill  . amLODipine-benazepril (LOTREL) 10-20 MG capsule TAKE 1 CAPSULE DAILY (Patient taking differently: Take 1 capsule by mouth daily. ) 90 capsule 4  . aspirin EC 81 MG tablet Take 81 mg by mouth daily.    . betamethasone dipropionate (DIPROLENE) 0.05 % ointment USE AS DIRECTED (Patient taking differently: Apply 1 application topically daily as needed (rash). USE AS DIRECTED) 45 g 5  . calcium carbonate (TUMS - DOSED IN MG ELEMENTAL CALCIUM) 500 MG chewable tablet Chew 2 tablets by mouth daily as needed for indigestion or heartburn.    . Glucosamine HCl (GLUCOSAMINE PO) Take 1 tablet by mouth daily.    Marland Kitchen levothyroxine (SYNTHROID, LEVOTHROID) 100 MCG tablet TAKE 1 TABLET DAILY (Patient taking differently: Take 100 mcg by mouth daily before breakfast. ) 90 tablet 4  . Multiple Vitamins-Minerals (MULTIVITAMIN WITH MINERALS) tablet Take 1 tablet by mouth daily.      Marland Kitchen oxyCODONE (OXY IR/ROXICODONE) 5 MG immediate release tablet Take 0.5-1 tablets (2.5-5 mg total) by mouth every 6 (six) hours as needed for severe pain. 10 tablet 0  . vitamin E 400 UNIT capsule Take 400 Units by mouth daily.     No current facility-administered medications for this visit.    Facility-Administered Medications Ordered in Other Visits  Medication Dose Route Frequency Provider Last Rate Last  Dose  . influenza  inactive virus vaccine (FLUZONE/FLUARIX) injection 0.5 mL  0.5 mL Intramuscular Once [name], MD         Allergies: No Known Allergies  Past Medical History, Surgical history, Social history, and Family History unchanged on review.   Physical Exam:    Blood pressure 124/61, pulse 62, temperature 98.9 F (37.2 C), temperature source Oral, resp. rate 17, height 5' 8"   (1.727 m), weight 128 lb 3.2 oz (58.2 kg), SpO2 98 %.      ECOG: 1    General appearance: Comfortable appearing without any discomfort Head: Normocephalic without any trauma Oropharynx: Mucous membranes are moist and pink without any thrush or ulcers. Eyes: Pupils are equal and round reactive to light. Lymph nodes: No cervical, supraclavicular, inguinal or axillary lymphadenopathy.   Heart:regular rate and rhythm.  S1 and S2 without leg edema. Lung: Clear without any rhonchi or wheezes.  No dullness to percussion. Abdomin: Soft, nontender, nondistended with good bowel sounds.  No hepatosplenomegaly. Musculoskeletal: No joint deformity or effusion.  Full range of motion noted. Neurological: No deficits noted on motor, sensory and deep tendon reflex exam. Skin: No petechial rash or dryness.  Appeared moist.                  Lab Results: Lab Results  Component Value Date   WBC 6.0 05/30/2019   HGB 14.1 05/30/2019   HCT 41.8 05/30/2019   MCV 90.3 05/30/2019   PLT 307 05/30/2019     Chemistry      Component Value Date/Time   NA 129 (L) 05/30/2019 1243   K 4.2 05/30/2019 1243   CL 101 05/30/2019 1243   CO2 18 (L) 05/30/2019 1243   BUN 8 05/30/2019 1243   CREATININE 0.70 05/30/2019 1243   CREATININE 0.63 (L) 01/05/2017 0934      Component Value Date/Time   CALCIUM 8.5 (L) 05/30/2019 1243   ALKPHOS 56 05/30/2019 1243   AST 17 05/30/2019 1243   ALT 9 05/30/2019 1243   BILITOT 0.4 05/30/2019 1243             Impression and Plan:  85 year old man with:  1.  Advanced squamous cell carcinoma of the skin diagnosed in November 2018.  He was found to have lymph node involvement of the axilla and inguinal lymph node on the right side.  He is status post therapy outlined above and currently on active surveillance.  The natural course of this disease as well as salvage therapy options were reviewed.  These would include systemic chemotherapy as well as  monoclonal antibody for EGFR.  After discussion today, we have opted to continue with active surveillance and restart systemic therapy as needed.  2.  Immune related complications: No issues reported related to immunotherapy at this time.  Long-term complications such as pneumonitis, colitis and thyroid disease were reiterated.  3.  Goals of care and prognosis: His disease is incurable although aggressive measures are warranted given his excellent performance status.   4.  Follow-up: Will be in 4 months for repeat evaluation.  15  minutes was spent with the patient face-to-face today.  More than 50% of time was spent on updating his disease status, treatment options and answering questions regarding future plan of care.     [name], MD 9/11/20203:06 PM

## 2019-09-10 ENCOUNTER — Telehealth: Payer: Self-pay | Admitting: Oncology

## 2019-09-10 NOTE — Telephone Encounter (Signed)
Called and spoke with patient. Confirmed appt  °

## 2019-09-14 ENCOUNTER — Telehealth: Payer: Self-pay

## 2019-09-14 NOTE — Telephone Encounter (Signed)
Called and informed patient of Dr. Hazeline Junker comment below. Pt verbalized understanding. Stated that he starting using a protein powder every morning and thinks this may be the cause of the swelling and has decided to stop using the powder to see if the swelling resolves. Patient advised to call PCP or seek medical attention if swelling becomes worse or does not resolve over time.  Pt verbalized understanding.

## 2019-09-14 NOTE — Telephone Encounter (Signed)
-----   Message from Wyatt Portela, MD sent at 09/14/2019  8:24 AM EDT ----- Regarding: RE: Patient Question This is not related to his infusion 3 months ago. He can try topical hydrocortisone cream on his lips but not inside of his mouth or tongue.  Thanks ----- Message ----- From: Teodoro Spray, RN Sent: 09/14/2019   7:59 AM EDT To: Wyatt Portela, MD Subject: Patient Question                               Pt called office stating he has noticed swelling in mouth, lips, and tongue. Pt saw dentist on 09/13/19, and feels the swelling is due to chemo regimen.  Pt last chemo infusion of libtayo was 05/30/19. Pt requesting guidance on what to do to help with swelling in mouth, states dental procedures are currently on hold until swelling is resolved.

## 2019-09-24 ENCOUNTER — Other Ambulatory Visit: Payer: Self-pay

## 2019-09-24 ENCOUNTER — Encounter: Payer: Self-pay | Admitting: Family Medicine

## 2019-09-24 ENCOUNTER — Ambulatory Visit (INDEPENDENT_AMBULATORY_CARE_PROVIDER_SITE_OTHER): Payer: Medicare Other | Admitting: Family Medicine

## 2019-09-24 VITALS — BP 122/80 | HR 50 | Temp 95.5°F | Wt 127.2 lb

## 2019-09-24 DIAGNOSIS — K0510 Chronic gingivitis, plaque induced: Secondary | ICD-10-CM

## 2019-09-24 DIAGNOSIS — Z23 Encounter for immunization: Secondary | ICD-10-CM

## 2019-09-24 DIAGNOSIS — K130 Diseases of lips: Secondary | ICD-10-CM | POA: Diagnosis not present

## 2019-09-24 DIAGNOSIS — C4492 Squamous cell carcinoma of skin, unspecified: Secondary | ICD-10-CM | POA: Diagnosis not present

## 2019-09-24 MED ORDER — PREDNISONE 10 MG (48) PO TBPK: ORAL_TABLET | ORAL | 0 refills | Status: DC

## 2019-09-24 NOTE — Patient Instructions (Signed)
Take all of the Steroid until its gone and if you are not back to normal call me

## 2019-09-24 NOTE — Progress Notes (Signed)
   Subjective:    Patient ID: Jeffrey Hardin, male    DOB: 05-17-1934, 85 y.o.   MRN: [id number]  HPI He states that over the last 6 weeks he has had increased difficulty with lip and gum swelling and pain.  He does have an underlying history of gamma cell carcinoma that has metastasized.  His last immunotherapy was greater than 6 weeks ago.  Prior to this starting he did see his dentist and apparently has had partial plates.  He did have an x-ray and did use some dental material in his mouth however that was about a month ago.  He did see the dentist after that when he was having difficulty and the dentist said that he did not think it was related anything dental.  He also discussed this with his oncologist and was told that it was probably not related to his underlying cancer or recent chemotherapy.  This is interfering with his ability to eat.   Review of Systems     Objective:   Physical Exam Alert and complaining of mild and lip pain.  Has upper and lower lips did show erythema and swelling.  The buccal mucosa was also erythematous.  The gums were red.  Small amount of whitish exudate was noted and was KOH negative.  Rest the mouth showed slight erythema but no evidence of yeast.       Assessment & Plan:  Cheilitis - Plan: predniSONE (STERAPRED UNI-PAK 48 TAB) 10 MG (48) TBPK tablet  Gingivitis - Plan: predniSONE (STERAPRED UNI-PAK 48 TAB) 10 MG (48) TBPK tablet  Squamous cell skin cancer  Need for influenza vaccination - Plan: Flu Vaccine QUAD High Dose(Fluad) He is to take the Dosepak and call me if he is not totally back to normal  when he finishes

## 2019-09-27 ENCOUNTER — Telehealth: Payer: Self-pay | Admitting: Family Medicine

## 2019-09-27 DIAGNOSIS — I119 Hypertensive heart disease without heart failure: Secondary | ICD-10-CM

## 2019-09-27 DIAGNOSIS — E039 Hypothyroidism, unspecified: Secondary | ICD-10-CM

## 2019-09-27 MED ORDER — AMLODIPINE BESY-BENAZEPRIL HCL 10-20 MG PO CAPS: 1.0000 | ORAL_CAPSULE | Freq: Every day | ORAL | 3 refills | Status: DC

## 2019-09-27 MED ORDER — LEVOTHYROXINE SODIUM 100 MCG PO TABS: 100.0000 ug | ORAL_TABLET | Freq: Every day | ORAL | 3 refills | Status: DC

## 2019-09-27 NOTE — Telephone Encounter (Signed)
Pt's wife called for refills of lotrel and levothyroxine. Please send to [facility].

## 2019-09-27 NOTE — Telephone Encounter (Signed)
Your pt

## 2019-11-01 ENCOUNTER — Telehealth: Payer: Self-pay

## 2019-11-01 NOTE — Telephone Encounter (Signed)
LVM for pt advising of Dr. Redmond School decision and to gfind out if he had a dermatologist he would like to be referred to. [place]

## 2019-11-01 NOTE — Telephone Encounter (Signed)
I am not comfortable doing that.  lets have him get into see a dermatologist

## 2019-11-01 NOTE — Telephone Encounter (Signed)
Pts wife called back to check the status of this

## 2019-11-01 NOTE — Telephone Encounter (Signed)
Pt. Wife called stating he was seen about 1 month ago and was given prednisone for sores in his mouth and swollen lips, per pt. Wife he has sores in the mouth again and his lips are swollen lips again also, pt. Wife stated he may need another round of prednisone sent in to [facility] or does he need to be seen again. Please advise.

## 2019-11-06 NOTE — Telephone Encounter (Signed)
Pt stated that he will wait due to improvement of mouth. [place]

## 2019-11-06 NOTE — Telephone Encounter (Signed)
LVM for pt to find out the name of dermatology office he would like to be referred to. [place]

## 2020-01-10 ENCOUNTER — Other Ambulatory Visit: Payer: Self-pay

## 2020-01-10 ENCOUNTER — Telehealth: Payer: Self-pay | Admitting: Oncology

## 2020-01-10 ENCOUNTER — Inpatient Hospital Stay: Payer: Medicare Other | Attending: Oncology | Admitting: Oncology

## 2020-01-10 VITALS — BP 141/62 | HR 63 | Temp 98.1°F | Resp 17 | Ht 68.0 in | Wt 133.6 lb

## 2020-01-10 DIAGNOSIS — C770 Secondary and unspecified malignant neoplasm of lymph nodes of head, face and neck: Secondary | ICD-10-CM | POA: Diagnosis not present

## 2020-01-10 DIAGNOSIS — Z79899 Other long term (current) drug therapy: Secondary | ICD-10-CM | POA: Diagnosis not present

## 2020-01-10 DIAGNOSIS — C4492 Squamous cell carcinoma of skin, unspecified: Secondary | ICD-10-CM | POA: Diagnosis not present

## 2020-01-10 NOTE — Progress Notes (Signed)
Hematology and Oncology Follow Up Visit  Jeffrey Hardin [id number] August 29, 1934 86 y.o. 01/10/2020 2:44 PM Jeffrey Hardin, MDLalonde, Jeffrey Jarvis, MD   Principle Diagnosis: 86 year old man with squamous cell carcinoma of the skin diagnosed in 2018 and subsequently developed advanced disease with lymphadenopathy including the axilla and inguinal regions.    Prior Therapy: He underwent excision of a malignant lesion measuring 11 x 10 cm and layered closure completed on January 24, 2018 by Dr. Iran Hardin.  The final pathology showed invasive squamous cell carcinoma with moderate differentiation measuring 8.8 cm with negative margins.    He is status post excision of a axillary mass completed by Dr. Barry Hardin on March 21, 2019.  Final pathology showed moderate to poorly differentiated squamous cell carcinoma.  Libtayo 350 mg every 3 weeks started on 06/23/2018.  He completed 12 cycles of therapy on March 10 of 2020.  Therapy concluded in June 2020.  Current therapy: Active surveillance.      Interim History: Mr. Jeffrey Hardin is here for a follow-up visit.  Since the last visit, he reports no major changes in his health.  He had an episode of mild swelling which has resolved at this time.  He denies any abdominal pain discomfort.  He denies any changes in his bowel habits.  Performance status and activity level remains unchanged at this time.  He denies any worsening axillary or inguinal adenopathy.            Medications: Reviewed without any changes. Current Outpatient Medications  Medication Sig Dispense Refill  . amLODipine-benazepril (LOTREL) 10-20 MG capsule Take 1 capsule by mouth daily. 90 capsule 3  . aspirin EC 81 MG tablet Take 81 mg by mouth daily.    . betamethasone dipropionate (DIPROLENE) 0.05 % ointment USE AS DIRECTED (Patient taking differently: Apply 1 application topically daily as needed (rash). USE AS DIRECTED) 45 g 5  . calcium carbonate (TUMS - DOSED IN MG ELEMENTAL CALCIUM) 500  MG chewable tablet Chew 2 tablets by mouth daily as needed for indigestion or heartburn.    . Glucosamine HCl (GLUCOSAMINE PO) Take 1 tablet by mouth daily.    Marland Kitchen levothyroxine (SYNTHROID) 100 MCG tablet Take 1 tablet (100 mcg total) by mouth daily before breakfast. 90 tablet 3  . Multiple Vitamins-Minerals (MULTIVITAMIN WITH MINERALS) tablet Take 1 tablet by mouth daily.      Marland Kitchen oxyCODONE (OXY IR/ROXICODONE) 5 MG immediate release tablet Take 0.5-1 tablets (2.5-5 mg total) by mouth every 6 (six) hours as needed for severe pain. (Patient not taking: Reported on 09/24/2019) 10 tablet 0  . predniSONE (STERAPRED UNI-PAK 48 TAB) 10 MG (48) TBPK tablet Take as per manufacturer's recommendation 48 tablet 0  . vitamin E 400 UNIT capsule Take 400 Units by mouth daily.     No current facility-administered medications for this visit.   Facility-Administered Medications Ordered in Other Visits  Medication Dose Route Frequency Provider Last Rate Last Admin  . influenza  inactive virus vaccine (FLUZONE/FLUARIX) injection 0.5 mL  0.5 mL Intramuscular Once Jeffrey Lung, MD         Allergies: No Known Allergies    Physical Exam:    Blood pressure (!) 141/62, pulse 63, temperature 98.1 F (36.7 C), temperature source Temporal, resp. rate 17, height '5\' 8"'$  (1.727 m), weight 133 lb 9.6 oz (60.6 kg), SpO2 100 %.      ECOG: 1    General appearance: Alert, awake without any distress. Head: Atraumatic without abnormalities Oropharynx: Without  any thrush or ulcers. Eyes: No scleral icterus. Lymph nodes: No lymphadenopathy noted in the cervical, supraclavicular, or axillary nodes Heart:regular rate and rhythm, without any murmurs or gallops.   Hardin: Clear to auscultation without any rhonchi, wheezes or dullness to percussion. Abdomin: Soft, nontender without any shifting dullness or ascites. Musculoskeletal: No clubbing or cyanosis. Neurological: No motor or sensory deficits. Skin: No rashes or  lesions. Psychiatric: Mood and affect appeared normal.                   Lab Results: Lab Results  Component Value Date   WBC 6.0 05/30/2019   HGB 14.1 05/30/2019   HCT 41.8 05/30/2019   MCV 90.3 05/30/2019   PLT 307 05/30/2019     Chemistry      Component Value Date/Time   NA 129 (L) 05/30/2019 1243   K 4.2 05/30/2019 1243   CL 101 05/30/2019 1243   CO2 18 (L) 05/30/2019 1243   BUN 8 05/30/2019 1243   CREATININE 0.70 05/30/2019 1243   CREATININE 0.63 (L) 01/05/2017 0934      Component Value Date/Time   CALCIUM 8.5 (L) 05/30/2019 1243   ALKPHOS 56 05/30/2019 1243   AST 17 05/30/2019 1243   ALT 9 05/30/2019 1243   BILITOT 0.4 05/30/2019 1243             Impression and Plan:  86 year old man with:  1.  Squamous cell carcinoma of the skin diagnosed in 2018 subsequently developed axillary and inguinal metastatic disease.    He is currently on active surveillance at this time after completing therapy outlined above.  Natural course of this disease and this resolves therapy options were reviewed including immunotherapy as well as EGFR targeted therapy as well as traditional chemotherapy.  For the time being we will continue active surveillance at this time given there is overall disease stability.   3.  Goals of care and prognosis: Therapy remains palliative at this time although aggressive measures are warranted given his reasonable performance status.   4.  Follow-up: 4 to 6 months for repeat evaluation.  20  minutes was spent on this encounter.  The time was dedicated to reviewing his disease status, treatment options and complications related to therapy.     Jeffrey Button, MD 1/14/20212:44 PM

## 2020-01-10 NOTE — Telephone Encounter (Signed)
Scheduled appt per 1/14 los.  Sent a message to HIM pool to get a calendar mailed out. 

## 2020-01-17 ENCOUNTER — Ambulatory Visit: Payer: Medicare Other | Attending: Internal Medicine

## 2020-01-17 DIAGNOSIS — Z23 Encounter for immunization: Secondary | ICD-10-CM

## 2020-01-17 NOTE — Progress Notes (Signed)
   Covid-19 Vaccination Clinic  Name:  Jeffrey Hardin    MRN: [id number] DOB: [birth year]  01/17/2020  Mr. Jeffrey Hardin was observed post Covid-19 immunization for 15 minutes without incidence. He was provided with Vaccine Information Sheet and instruction to access the V-Safe system.   Mr. Jeffrey Hardin was instructed to call 911 with any severe reactions post vaccine: Marland Kitchen Difficulty breathing  . Swelling of your face and throat  . A fast heartbeat  . A bad rash all over your body  . Dizziness and weakness    Immunizations Administered    Name Date Dose VIS Date Route   Pfizer COVID-19 Vaccine 01/17/2020  9:39 AM 0.3 mL 12/07/2019 Intramuscular   Manufacturer: [place]   Lot: [id number]   [place]: [id number]

## 2020-01-18 ENCOUNTER — Telehealth: Payer: Self-pay | Admitting: Oncology

## 2020-01-18 NOTE — Telephone Encounter (Signed)
Returned patient's phone call regarding rescheduling 07/20 appointment, per patient's request appointment has moved to 08/03.

## 2020-02-05 ENCOUNTER — Ambulatory Visit: Payer: Medicare Other | Attending: Internal Medicine

## 2020-02-07 ENCOUNTER — Ambulatory Visit: Payer: Medicare Other | Attending: Internal Medicine

## 2020-02-07 DIAGNOSIS — Z23 Encounter for immunization: Secondary | ICD-10-CM | POA: Insufficient documentation

## 2020-07-15 ENCOUNTER — Ambulatory Visit: Payer: Medicare Other | Admitting: Oncology

## 2020-07-29 ENCOUNTER — Inpatient Hospital Stay: Payer: Medicare Other | Attending: Oncology | Admitting: Oncology

## 2020-07-29 ENCOUNTER — Telehealth: Payer: Self-pay | Admitting: Oncology

## 2020-07-29 ENCOUNTER — Other Ambulatory Visit: Payer: Self-pay

## 2020-07-29 VITALS — BP 141/55 | HR 59 | Temp 97.5°F | Resp 16 | Ht 68.0 in | Wt 135.4 lb

## 2020-07-29 DIAGNOSIS — Z85828 Personal history of other malignant neoplasm of skin: Secondary | ICD-10-CM

## 2020-07-29 DIAGNOSIS — C4492 Squamous cell carcinoma of skin, unspecified: Secondary | ICD-10-CM | POA: Diagnosis not present

## 2020-07-29 NOTE — Telephone Encounter (Signed)
Scheduled per 08/03 los, patient has been called and notified. 

## 2020-07-29 NOTE — Progress Notes (Signed)
Hematology and Oncology Follow Up Visit  Jeffrey Hardin [id number] Mar 25, 1934 86 y.o. 07/29/2020 9:50 AM Jeffrey Hardin, MDLalonde, Jeffrey Jarvis, MD   Principle Diagnosis: 86 year old man with advanced squamous cell carcinoma of the skin presented with axillary and inguinal adenopathy in 2018.  .    Prior Therapy: He underwent excision of a malignant lesion measuring 11 x 10 cm and layered closure completed on January 24, 2018 by Dr. Iran Hardin.  The final pathology showed invasive squamous cell carcinoma with moderate differentiation measuring 8.8 cm with negative margins.    He is status post excision of a axillary mass completed by Dr. Barry Hardin on March 21, 2019.  Final pathology showed moderate to poorly differentiated squamous cell carcinoma.  Libtayo 350 mg every 3 weeks started on 06/23/2018.  He completed 12 cycles of therapy on March 10 of 2020.  Therapy concluded in June 2020.  Current therapy: Active surveillance.      Interim History: Jeffrey Hardin presents today for a repeat evaluation.  Since the last visit, he reports no major changes in his health.  He continues to be active and attends to activities of daily living without any decline.  He denies any recent lymphadenopathy or masses.  He denies excessive fatigue or tiredness or any recent hospitalizations.  He does report some instability at times with his prosthetic leg and he is planning adjustment through the [place].           Medications: Updated on review. Current Outpatient Medications  Medication Sig Dispense Refill  . amLODipine-benazepril (LOTREL) 10-20 MG capsule Take 1 capsule by mouth daily. 90 capsule 3  . aspirin EC 81 MG tablet Take 81 mg by mouth daily.    . betamethasone dipropionate (DIPROLENE) 0.05 % ointment USE AS DIRECTED (Patient taking differently: Apply 1 application topically daily as needed (rash). USE AS DIRECTED) 45 g 5  . calcium carbonate (TUMS - DOSED IN MG ELEMENTAL CALCIUM) 500 MG chewable tablet  Chew 2 tablets by mouth daily as needed for indigestion or heartburn.    . Glucosamine HCl (GLUCOSAMINE PO) Take 1 tablet by mouth daily.    Marland Kitchen levothyroxine (SYNTHROID) 100 MCG tablet Take 1 tablet (100 mcg total) by mouth daily before breakfast. 90 tablet 3  . Multiple Vitamins-Minerals (MULTIVITAMIN WITH MINERALS) tablet Take 1 tablet by mouth daily.      Marland Kitchen oxyCODONE (OXY IR/ROXICODONE) 5 MG immediate release tablet Take 0.5-1 tablets (2.5-5 mg total) by mouth every 6 (six) hours as needed for severe pain. (Patient not taking: Reported on 09/24/2019) 10 tablet 0  . predniSONE (STERAPRED UNI-PAK 48 TAB) 10 MG (48) TBPK tablet Take as per manufacturer's recommendation 48 tablet 0  . vitamin E 400 UNIT capsule Take 400 Units by mouth daily.     No current facility-administered medications for this visit.   Facility-Administered Medications Ordered in Other Visits  Medication Dose Route Frequency Provider Last Rate Last Admin  . influenza  inactive virus vaccine (FLUZONE/FLUARIX) injection 0.5 mL  0.5 mL Intramuscular Once Jeffrey Lung, MD         Allergies: No Known Allergies    Physical Exam:    Blood pressure (!) 141/55, pulse (!) 59, temperature (!) 97.5 F (36.4 C), temperature source Temporal, resp. rate 16, height '5\' 8"'$  (1.727 m), weight 135 lb 6.4 oz (61.4 kg), SpO2 99 %.       ECOG: 1    General appearance: Comfortable appearing without any discomfort Head: Normocephalic without any trauma  Oropharynx: Mucous membranes are moist and pink without any thrush or ulcers. Eyes: Pupils are equal and round reactive to light. Lymph nodes: No cervical, supraclavicular, inguinal or axillary lymphadenopathy.   Heart:regular rate and rhythm.  S1 and S2 without leg edema. Hardin: Clear without any rhonchi or wheezes.  No dullness to percussion. Abdomin: Soft, nontender, nondistended with good bowel sounds.  No hepatosplenomegaly. Musculoskeletal: No joint deformity or effusion.   Full range of motion noted. Neurological: No deficits noted on motor, sensory and deep tendon reflex exam. Skin: No petechial rash or dryness.  Appeared moist.                    Lab Results: Lab Results  Component Value Date   WBC 6.0 05/30/2019   HGB 14.1 05/30/2019   HCT 41.8 05/30/2019   MCV 90.3 05/30/2019   PLT 307 05/30/2019     Chemistry      Component Value Date/Time   NA 129 (L) 05/30/2019 1243   K 4.2 05/30/2019 1243   CL 101 05/30/2019 1243   CO2 18 (L) 05/30/2019 1243   BUN 8 05/30/2019 1243   CREATININE 0.70 05/30/2019 1243   CREATININE 0.63 (L) 01/05/2017 0934      Component Value Date/Time   CALCIUM 8.5 (L) 05/30/2019 1243   ALKPHOS 56 05/30/2019 1243   AST 17 05/30/2019 1243   ALT 9 05/30/2019 1243   BILITOT 0.4 05/30/2019 1243             Impression and Plan:  86 year old man with:  1.  Advanced squamous cell carcinoma without lymphadenopathy diagnosed in 2018.     The natural course of his disease was updated and treatment options moving forward were reviewed.  He status post definitive therapy outlined above with excellent response followed by surgical resection.  Treatment options include retreatment with immunotherapy, cisplatin based chemotherapy or EGFR targeted therapy.  After discussion today, we have opted to continue active surveillance.  The fact that he has no evidence of recurrent disease.  2.  Covid vaccination considerations.  He is up-to-date and completed his Covid vaccination series.  3.  Goals of care and prognosis: His disease remains incurable although aggressive measures are warranted given his reasonable performance status.   4.  Follow-up: in 6 months for repeat evaluation.  30  minutes were dedicated to this visit.  The time was spent on reviewing his disease status, discussing treatment options and future plan of care review.     Jeffrey Button, MD 8/3/20219:50 AM

## 2020-09-07 ENCOUNTER — Other Ambulatory Visit: Payer: Self-pay | Admitting: Family Medicine

## 2020-09-07 DIAGNOSIS — E039 Hypothyroidism, unspecified: Secondary | ICD-10-CM

## 2020-09-07 DIAGNOSIS — I119 Hypertensive heart disease without heart failure: Secondary | ICD-10-CM

## 2020-10-14 ENCOUNTER — Ambulatory Visit (INDEPENDENT_AMBULATORY_CARE_PROVIDER_SITE_OTHER): Payer: Medicare Other | Admitting: Family Medicine

## 2020-10-14 ENCOUNTER — Encounter: Payer: Self-pay | Admitting: Family Medicine

## 2020-10-14 ENCOUNTER — Other Ambulatory Visit: Payer: Self-pay

## 2020-10-14 VITALS — BP 154/60 | HR 42 | Temp 96.9°F | Wt 134.0 lb

## 2020-10-14 DIAGNOSIS — Z23 Encounter for immunization: Secondary | ICD-10-CM

## 2020-10-14 DIAGNOSIS — C4492 Squamous cell carcinoma of skin, unspecified: Secondary | ICD-10-CM | POA: Diagnosis not present

## 2020-10-14 DIAGNOSIS — Z0000 Encounter for general adult medical examination without abnormal findings: Secondary | ICD-10-CM | POA: Diagnosis not present

## 2020-10-14 DIAGNOSIS — I119 Hypertensive heart disease without heart failure: Secondary | ICD-10-CM

## 2020-10-14 DIAGNOSIS — Z8679 Personal history of other diseases of the circulatory system: Secondary | ICD-10-CM

## 2020-10-14 DIAGNOSIS — E785 Hyperlipidemia, unspecified: Secondary | ICD-10-CM | POA: Diagnosis not present

## 2020-10-14 DIAGNOSIS — E039 Hypothyroidism, unspecified: Secondary | ICD-10-CM | POA: Diagnosis not present

## 2020-10-14 DIAGNOSIS — I10 Essential (primary) hypertension: Secondary | ICD-10-CM | POA: Diagnosis not present

## 2020-10-14 LAB — LIPID PANEL

## 2020-10-14 MED ORDER — AMLODIPINE BESY-BENAZEPRIL HCL 10-20 MG PO CAPS: 1.0000 | ORAL_CAPSULE | Freq: Every day | ORAL | 3 refills | Status: DC

## 2020-10-14 MED ORDER — LEVOTHYROXINE SODIUM 100 MCG PO TABS: 100.0000 ug | ORAL_TABLET | Freq: Every day | ORAL | 3 refills | Status: DC

## 2020-10-14 NOTE — Progress Notes (Signed)
Jeffrey Hardin is a 86 y.o. male who presents for annual wellness visit and follow-up on chronic medical conditions.  He has no particular concerns or complaints.  He has been involved in immunotherapy to help treat his metastatic squamous cell carcinoma.  Apparently it has been successful least according to him.  He continues on his thyroid medication.  He does have underlying hypertensive heart disease but is having no chest pain, shortness of breath or PND.  He has had a previous history of atrial fibrillation.  He is not interested in getting the Shingrix vaccine.   Immunizations and Health Maintenance Immunization History  Administered Date(s) Administered  . Fluad Quad(high Dose 65+) 09/24/2019  . Influenza Split 09/28/2011, 10/18/2012  . Influenza Whole 11/06/1999, 09/23/2008, 09/08/2010  . Influenza, High Dose Seasonal PF 11/02/2016, 09/27/2017  . Influenza,inj,Quad PF,6+ Mos 09/11/2013  . Influenza-Unspecified 09/10/2018  . PFIZER SARS-COV-2 Vaccination 01/17/2020, 02/07/2020  . Pneumococcal Conjugate-13 04/22/2015  . Pneumococcal Polysaccharide-23 08/22/2008  . Tdap 08/22/2008   Health Maintenance Due  Topic Date Due  . TETANUS/TDAP  08/22/2018  . INFLUENZA VACCINE  07/27/2020    Last colonoscopy: 08/11/06 Last PSA: unknown Dentist: Q six months Ophtho: Q year Exercise:N/A  Other doctors caring for patient include: Dr.Shadad oncology,   Advanced Directives: Does Patient Have a Medical Advance Directive?: Yes Type of Advance Directive: Living will Does patient want to make changes to medical advance directive?: No - Patient declined  Depression screen:  See questionnaire below.     Depression screen [facility] 2/9 10/14/2020 01/05/2017 12/13/2013 09/07/2012  Decreased Interest 0 0 0 0  Down, Depressed, Hopeless 0 0 0 0  PHQ - 2 Score 0 0 0 0    Fall Screen: See Questionaire below.   Fall Risk  10/14/2020 01/05/2017 12/13/2013  Falls in the past year? 1 No No  Number  falls in past yr: 1 - -  Injury with Fall? 0 - -  Risk for fall due to : Impaired mobility - -    ADL screen:  See questionnaire below.  Functional Status Survey: Is the patient deaf or have difficulty hearing?: No Does the patient have difficulty seeing, even when wearing glasses/contacts?: No Does the patient have difficulty concentrating, remembering, or making decisions?: No Does the patient have difficulty walking or climbing stairs?: Yes Does the patient have difficulty dressing or bathing?: No Does the patient have difficulty doing errands alone such as visiting a doctor's office or shopping?: No   Review of Systems  Constitutional: -, -unexpected weight change, -anorexia, -fatigue Allergy: -sneezing, -itching, -congestion Dermatology: denies changing moles, rash, lumps ENT: -runny nose, -ear pain, -sore throat,  Cardiology:  -chest pain, -palpitations, -orthopnea, Respiratory: -cough, -shortness of breath, -dyspnea on exertion, -wheezing,  Gastroenterology: -abdominal pain, -nausea, -vomiting, -diarrhea, -constipation, -dysphagia Hematology: -bleeding or bruising problems Musculoskeletal: -arthralgias, -myalgias, -joint swelling, -back pain, - Ophthalmology: -vision changes,  Urology: -dysuria, -difficulty urinating,  -urinary frequency, -urgency, incontinence Neurology: -, -numbness, , -memory loss, -falls, -dizziness    PHYSICAL EXAM:  BP (!) 154/60   Pulse (!) 42   Temp (!) 96.9 F (36.1 C)   Wt 134 lb (60.8 kg)   SpO2 97%   BMI 20.37 kg/m   General Appearance: Alert, cooperative, no distress, appears stated age Head: Normocephalic, without obvious abnormality, atraumatic Eyes: PERRL, conjunctiva/corneas clear, EOM's intact. Ears: Normal TM's and external ear canals Nose: Nares normal, mucosa normal, no drainage or sinus   tenderness Throat: Lips, mucosa, and tongue normal;  teeth and gums normal Neck: Supple, no lymphadenopathy, thyroid:no  enlargement/tenderness/nodules; no carotid bruit or JVD Lungs: Clear to auscultation bilaterally without wheezes, rales or ronchi; respirations unlabored Heart: Regular rate and rhythm, S1 and S2 normal, no murmur, rub or gallop Abdomen: Soft, non-tender, nondistended, normoactive bowel sounds, no masses, no hepatosplenomegaly Extremity: No clubbing, cyanosis or edema Pulses: 2+ and symmetric all extremities Skin: Skin color, texture, turgor normal, no rashes or lesions Lymph nodes: Cervical, supraclavicular, and axillary nodes normal Neurologic: CNII-XII intact, normal strength, sensation and gait; reflexes 2+ and symmetric throughout   Psych: Normal mood, affect, hygiene and grooming  ASSESSMENT/PLAN: Squamous cell carcinoma of skin  Hypertensive heart disease without heart failure  Hypothyroidism, unspecified type  Hyperlipidemia with target LDL less than 100  History of atrial fibrillation In general things are going quite well for him.  Did recommend getting Tdap.    healthy diet and alcohol recommendations (less than or equal to 2 drinks/day) reviewed; regular seatbelt use; changing batteries in smoke detectors. Immunization recommendations discussed.  Colonoscopy recommendations reviewed.   Medicare Attestation I have personally reviewed: The patient's medical and social history Their use of alcohol, tobacco or illicit drugs Their current medications and supplements The patient's functional ability including ADLs,fall risks, home safety risks, cognitive, and hearing and visual impairment Diet and physical activities Evidence for depression or mood disorders  The patient's weight, height, and BMI have been recorded in the chart.  I have made referrals, counseling, and provided education to the patient based on review of the above and I have provided the patient with a written personalized care plan for preventive services.     Jill Alexanders, MD   10/14/2020

## 2020-10-15 ENCOUNTER — Other Ambulatory Visit: Payer: Self-pay

## 2020-10-15 ENCOUNTER — Telehealth: Payer: Self-pay

## 2020-10-15 DIAGNOSIS — E039 Hypothyroidism, unspecified: Secondary | ICD-10-CM

## 2020-10-15 LAB — CBC WITH DIFFERENTIAL/PLATELET
Basophils Absolute: 0.1 10*3/uL (ref 0.0–0.2)
Basos: 1 %
EOS (ABSOLUTE): 0.1 10*3/uL (ref 0.0–0.4)
Eos: 2 %
Hematocrit: 45.3 % (ref 37.5–51.0)
Hemoglobin: 15.9 g/dL (ref 13.0–17.7)
Immature Grans (Abs): 0 10*3/uL (ref 0.0–0.1)
Immature Granulocytes: 0 %
Lymphocytes Absolute: 1.1 10*3/uL (ref 0.7–3.1)
Lymphs: 15 %
MCH: 32.2 pg (ref 26.6–33.0)
MCHC: 35.1 g/dL (ref 31.5–35.7)
MCV: 92 fL (ref 79–97)
Monocytes Absolute: 0.7 10*3/uL (ref 0.1–0.9)
Monocytes: 10 %
Neutrophils Absolute: 5.3 10*3/uL (ref 1.4–7.0)
Neutrophils: 72 %
Platelets: 412 10*3/uL (ref 150–450)
RBC: 4.94 x10E6/uL (ref 4.14–5.80)
RDW: 13 % (ref 11.6–15.4)
WBC: 7.2 10*3/uL (ref 3.4–10.8)

## 2020-10-15 LAB — LIPID PANEL
Chol/HDL Ratio: 2 ratio (ref 0.0–5.0)
Cholesterol, Total: 168 mg/dL (ref 100–199)
HDL: 83 mg/dL (ref >39)
LDL Chol Calc (NIH): 75 mg/dL (ref 0–99)
Triglycerides: 45 mg/dL (ref 0–149)
VLDL Cholesterol Cal: 10 mg/dL (ref 5–40)

## 2020-10-15 LAB — TSH: TSH: 7.09 u[IU]/mL — ABNORMAL HIGH (ref 0.450–4.500)

## 2020-10-15 LAB — COMPREHENSIVE METABOLIC PANEL
ALT: 9 IU/L (ref 0–44)
AST: 17 IU/L (ref 0–40)
Albumin/Globulin Ratio: 1.4 (ref 1.2–2.2)
Albumin: 3.9 g/dL (ref 3.6–4.6)
Alkaline Phosphatase: 66 IU/L (ref 44–121)
BUN/Creatinine Ratio: 19 (ref 10–24)
BUN: 12 mg/dL (ref 8–27)
Bilirubin Total: 0.6 mg/dL (ref 0.0–1.2)
CO2: 22 mmol/L (ref 20–29)
Calcium: 9.1 mg/dL (ref 8.6–10.2)
Chloride: 96 mmol/L (ref 96–106)
Creatinine, Ser: 0.64 mg/dL — ABNORMAL LOW (ref 0.76–1.27)
GFR calc Af Amer: 104 mL/min/{1.73_m2} (ref >59)
GFR calc non Af Amer: 90 mL/min/{1.73_m2} (ref >59)
Globulin, Total: 2.7 g/dL (ref 1.5–4.5)
Glucose: 101 mg/dL — ABNORMAL HIGH (ref 65–99)
Potassium: 4.6 mmol/L (ref 3.5–5.2)
Sodium: 132 mmol/L — ABNORMAL LOW (ref 134–144)
Total Protein: 6.6 g/dL (ref 6.0–8.5)

## 2020-10-15 NOTE — Addendum Note (Signed)
Addended by: [name] on: 10/15/2020 08:11 AM   Modules accepted: Orders

## 2020-10-15 NOTE — Telephone Encounter (Signed)
LVM for pt to call back and advise office of the place where he received the covid booster. Mio

## 2020-10-15 NOTE — Progress Notes (Signed)
Error

## 2020-10-30 DIAGNOSIS — Z20828 Contact with and (suspected) exposure to other viral communicable diseases: Secondary | ICD-10-CM | POA: Diagnosis not present

## 2020-12-15 ENCOUNTER — Other Ambulatory Visit: Payer: Self-pay

## 2021-01-08 DIAGNOSIS — Z1152 Encounter for screening for COVID-19: Secondary | ICD-10-CM | POA: Diagnosis not present

## 2021-01-20 ENCOUNTER — Other Ambulatory Visit: Payer: Self-pay | Admitting: Family Medicine

## 2021-01-20 NOTE — Telephone Encounter (Signed)
[facility] is requesting to fill pt betamethasone. Please advise [facility]

## 2021-01-27 ENCOUNTER — Inpatient Hospital Stay: Payer: Medicare Other | Attending: Oncology | Admitting: Oncology

## 2021-01-27 ENCOUNTER — Other Ambulatory Visit: Payer: Self-pay

## 2021-01-27 VITALS — BP 157/57 | HR 82 | Temp 97.9°F | Resp 18 | Ht 68.0 in | Wt 137.3 lb

## 2021-01-27 DIAGNOSIS — Z85828 Personal history of other malignant neoplasm of skin: Secondary | ICD-10-CM | POA: Insufficient documentation

## 2021-01-27 DIAGNOSIS — C4492 Squamous cell carcinoma of skin, unspecified: Secondary | ICD-10-CM | POA: Diagnosis not present

## 2021-01-27 NOTE — Progress Notes (Signed)
Hematology and Oncology Follow Up Visit  Jeffrey Hardin [id number] 04-26-1934 87 y.o. 01/27/2021 10:10 AM [name], MDLalonde, [name], MD   Principle Diagnosis: 87 year old man with squamous cell carcinoma of the skin diagnosed in 2018.  He presented with axillary and inguinal adenopathy indicating advanced disease.   Prior Therapy: He underwent excision of a malignant lesion measuring 11 x 10 cm and layered closure completed on January 24, 2018 by Dr. Iran Planas.  The final pathology showed invasive squamous cell carcinoma with moderate differentiation measuring 8.8 cm with negative margins.    He is status post excision of a axillary mass completed by Dr. Barry Dienes on March 21, 2019.  Final pathology showed moderate to poorly differentiated squamous cell carcinoma.  Libtayo 350 mg every 3 weeks started on 06/23/2018.  He completed 12 cycles of therapy on March 10 of 2020.  Therapy concluded in June 2020.  Current therapy: Active surveillance.      Interim History: Mr. Steinhart is here for a follow-up visit.  Since her last visit, he reports feeling well without any major complaints.  He denies any recent hospitalization or illnesses.  He denies any lymphadenopathy or fatigue.  He is able to attempt activities of daily living without any difficulties.  He has been able to travel on vacation regularly without any issues.           Medications: Reviewed without changes. Current Outpatient Medications  Medication Sig Dispense Refill  . amLODipine-benazepril (LOTREL) 10-20 MG capsule Take 1 capsule by mouth daily. 90 capsule 3  . aspirin EC 81 MG tablet Take 81 mg by mouth daily.    . betamethasone dipropionate (DIPROLENE) 0.05 % ointment USE AS DIRECTED 45 g 5  . calcium carbonate (TUMS - DOSED IN MG ELEMENTAL CALCIUM) 500 MG chewable tablet Chew 2 tablets by mouth daily as needed for indigestion or heartburn.    . Glucosamine HCl (GLUCOSAMINE PO) Take 1 tablet by mouth daily.    Marland Kitchen  levothyroxine (SYNTHROID) 100 MCG tablet Take 1 tablet (100 mcg total) by mouth daily before breakfast. 90 tablet 3  . Multiple Vitamins-Minerals (MULTIVITAMIN WITH MINERALS) tablet Take 1 tablet by mouth daily.      Marland Kitchen oxyCODONE (OXY IR/ROXICODONE) 5 MG immediate release tablet Take 0.5-1 tablets (2.5-5 mg total) by mouth every 6 (six) hours as needed for severe pain. (Patient not taking: Reported on 09/24/2019) 10 tablet 0  . predniSONE (STERAPRED UNI-PAK 48 TAB) 10 MG (48) TBPK tablet Take as per manufacturer's recommendation (Patient not taking: Reported on 10/14/2020) 48 tablet 0  . vitamin E 400 UNIT capsule Take 400 Units by mouth daily.     No current facility-administered medications for this visit.   Facility-Administered Medications Ordered in Other Visits  Medication Dose Route Frequency Provider Last Rate Last Admin  . influenza  inactive virus vaccine (FLUZONE/FLUARIX) injection 0.5 mL  0.5 mL Intramuscular Once [name], MD         Allergies: No Known Allergies    Physical Exam:     Blood pressure (!) 157/57, pulse 82, temperature 97.9 F (36.6 C), temperature source Tympanic, resp. rate 18, height 5' 8" (1.727 m), weight 137 lb 4.8 oz (62.3 kg), SpO2 100 %.       ECOG: 1    General appearance: Alert, awake without any distress. Head: Atraumatic without abnormalities Oropharynx: Without any thrush or ulcers. Eyes: No scleral icterus. Lymph nodes: No lymphadenopathy noted in the cervical, supraclavicular, or axillary nodes  Heart:regular rate and rhythm, without any murmurs or gallops.   Lung: Clear to auscultation without any rhonchi, wheezes or dullness to percussion. Abdomin: Soft, nontender without any shifting dullness or ascites. Musculoskeletal: No clubbing or cyanosis. Neurological: No motor or sensory deficits. Skin: No rashes or lesions.                    Lab Results: Lab Results  Component Value Date   WBC 7.2 10/14/2020    HGB 15.9 10/14/2020   HCT 45.3 10/14/2020   MCV 92 10/14/2020   PLT 412 10/14/2020     Chemistry      Component Value Date/Time   NA 132 (L) 10/14/2020 1104   K 4.6 10/14/2020 1104   CL 96 10/14/2020 1104   CO2 22 10/14/2020 1104   BUN 12 10/14/2020 1104   CREATININE 0.64 (L) 10/14/2020 1104   CREATININE 0.70 05/30/2019 1243   CREATININE 0.63 (L) 01/05/2017 0934      Component Value Date/Time   CALCIUM 9.1 10/14/2020 1104   ALKPHOS 66 10/14/2020 1104   AST 17 10/14/2020 1104   AST 17 05/30/2019 1243   ALT 9 10/14/2020 1104   ALT 9 05/30/2019 1243   BILITOT 0.6 10/14/2020 1104   BILITOT 0.4 05/30/2019 1243             Impression and Plan:  86-year-old man with:   1.  Squamous cell carcinoma of the skin presented with advanced disease and lymphadenopathy in 2018.     He is currently on active surveillance after he opted against any additional treatments.  He has received immunotherapy with excellent response initially.  The natural course of this disease and treatment options in the future were discussed.  Indication for treatment include progressive disease and symptoms related to his progression.  Treatment options would include retreatment with Libtayo versus different PD-L1 inhibitors.  Systemic chemotherapy could also be utilized.    After discussion today, he opted to continue with active surveillance.  His physical examination does not reveal any evidence of disease progression.  2.  Covid vaccination considerations.  He is currently up-to-date including a booster.  3.  Goals of care and prognosis: Therapy remains palliative although aggressive measures are warranted given his excellent performance status.   4.  Follow-up: In 6 months for repeat follow-up.  30  minutes were spent on this encounter.  The time was dedicated to reviewing his disease status, treatment options and future plan care discussion.     Firas Shadad, MD 2/1/202210:10 AM 

## 2021-01-29 ENCOUNTER — Telehealth: Payer: Self-pay | Admitting: Oncology

## 2021-01-29 NOTE — Telephone Encounter (Signed)
Scheduled per 02/01 los, patient has been called and voicemail was left. 

## 2021-02-12 DIAGNOSIS — Z1152 Encounter for screening for COVID-19: Secondary | ICD-10-CM | POA: Diagnosis not present

## 2021-05-21 DIAGNOSIS — Z20822 Contact with and (suspected) exposure to covid-19: Secondary | ICD-10-CM | POA: Diagnosis not present

## 2021-05-21 DIAGNOSIS — I498 Other specified cardiac arrhythmias: Secondary | ICD-10-CM | POA: Diagnosis not present

## 2021-06-15 ENCOUNTER — Ambulatory Visit (INDEPENDENT_AMBULATORY_CARE_PROVIDER_SITE_OTHER): Payer: Medicare Other | Admitting: Family Medicine

## 2021-06-15 ENCOUNTER — Other Ambulatory Visit: Payer: Self-pay

## 2021-06-15 VITALS — BP 156/68 | HR 40 | Temp 97.4°F | Wt 136.0 lb

## 2021-06-15 DIAGNOSIS — Z8679 Personal history of other diseases of the circulatory system: Secondary | ICD-10-CM | POA: Diagnosis not present

## 2021-06-15 DIAGNOSIS — C4492 Squamous cell carcinoma of skin, unspecified: Secondary | ICD-10-CM | POA: Diagnosis not present

## 2021-06-15 DIAGNOSIS — I119 Hypertensive heart disease without heart failure: Secondary | ICD-10-CM

## 2021-06-15 DIAGNOSIS — Z7901 Long term (current) use of anticoagulants: Secondary | ICD-10-CM

## 2021-06-15 DIAGNOSIS — S0990XA Unspecified injury of head, initial encounter: Secondary | ICD-10-CM | POA: Diagnosis not present

## 2021-06-15 DIAGNOSIS — R001 Bradycardia, unspecified: Secondary | ICD-10-CM | POA: Diagnosis not present

## 2021-06-15 NOTE — Progress Notes (Signed)
   Subjective:    Patient ID: Jeffrey Hardin, male    DOB: [birth year], 86 y.o.   MRN: [id number]  HPI He is here for consult concerning a fall that occurred when he tripped over an indentation in the sidewalk hitting the occipital parietal area on the right.  He did not lose consciousness.  No blurred vision, double vision, nausea or vomiting or headache at the present time.  Review of the record indicates he has a long history of difficulty with A. fib and has been on Eliquis for several years.  He was seen in the past by Dr. Wynonia Lawman but is not seen by anybody since 2015.   Review of Systems     Objective:   Physical Exam Alert and in no distress.  Contused area noted in the right parieto-occipital area.  No tenderness to palpation.  EOMI.  Normal cerebellar.  Other cranial nerves grossly intact.  Cardiac exam shows a bradycardia with a junctional rhythm.  No evidence of A. fib is a rate is regular       Assessment & Plan:   Injury of head, initial encounter  Hypertensive heart disease without heart failure - Plan: EKG 12-Lead  History of atrial fibrillation - Plan: EKG 12-Lead, Ambulatory referral to Cardiology  Chronic anticoagulation  Bradycardia - Plan: Ambulatory referral to Cardiology I do not see the need for pursuing the head injury any further as it seems to doing fairly well.  His vision seems to have changed and it still quite bradycardic but the fall was unrelated to his rhythm.  We will have him evaluated by cardiology to look into this more thoroughly.

## 2021-07-23 ENCOUNTER — Telehealth: Payer: Self-pay | Admitting: Oncology

## 2021-07-23 NOTE — Telephone Encounter (Signed)
R/s per prov pal, pt called in to clarify,pt aware of appt.

## 2021-07-24 ENCOUNTER — Telehealth: Payer: Self-pay | Admitting: Oncology

## 2021-07-24 NOTE — Telephone Encounter (Signed)
Rescheduled 08/02 provider visit to 08/31 due to provider pal, patient has been called and notified.

## 2021-07-28 ENCOUNTER — Ambulatory Visit: Payer: Medicare Other | Admitting: Oncology

## 2021-08-25 ENCOUNTER — Ambulatory Visit: Payer: Medicare Other | Admitting: Cardiology

## 2021-08-26 ENCOUNTER — Inpatient Hospital Stay: Payer: Medicare Other | Attending: Oncology | Admitting: Oncology

## 2021-08-26 ENCOUNTER — Other Ambulatory Visit: Payer: Self-pay

## 2021-08-26 VITALS — BP 181/61 | HR 70 | Temp 98.6°F | Resp 17 | Ht 68.0 in | Wt 138.5 lb

## 2021-08-26 DIAGNOSIS — Z85828 Personal history of other malignant neoplasm of skin: Secondary | ICD-10-CM | POA: Diagnosis not present

## 2021-08-26 DIAGNOSIS — Z79899 Other long term (current) drug therapy: Secondary | ICD-10-CM | POA: Diagnosis not present

## 2021-08-26 DIAGNOSIS — R590 Localized enlarged lymph nodes: Secondary | ICD-10-CM | POA: Insufficient documentation

## 2021-08-26 DIAGNOSIS — C4492 Squamous cell carcinoma of skin, unspecified: Secondary | ICD-10-CM | POA: Diagnosis not present

## 2021-08-26 NOTE — Progress Notes (Signed)
Hematology and Oncology Follow Up Visit  Jeffrey Hardin [id number] 10-22-1934 87 y.o. 08/26/2021 9:46 AM [name], MDLalonde, [name], MD   Principle Diagnosis: 87 year old man with advanced squamous cell carcinoma of the skin with axillary and inguinal adenopathy diagnosed in 2018.     Prior Therapy: He underwent excision of a malignant lesion measuring 11 x 10 cm and layered closure completed on January 24, 2018 by Dr. Iran Planas.  The final pathology showed invasive squamous cell carcinoma with moderate differentiation measuring 8.8 cm with negative margins.    He is status post excision of a axillary mass completed by Dr. Barry Dienes on March 21, 2019.  Final pathology showed moderate to poorly differentiated squamous cell carcinoma.  Libtayo 350 mg every 3 weeks started on 06/23/2018.  He completed 12 cycles of therapy on March 10 of 2020.  Therapy concluded in June 2020.  Current therapy: Active surveillance.      Interim History: [name] is here for return evaluation.  Since the last visit, he reports feeling well without any major complaints.  He denies any nausea vomiting or abdominal pain.  He denies any palpable adenopathy.  Still ambulating without any major difficulties.  He still travels with his wife on a cruise multiple times a year.  He denies any weakness or endurance and ability to do so.           Medications: Updated on review. Current Outpatient Medications  Medication Sig Dispense Refill   amLODipine-benazepril (LOTREL) 10-20 MG capsule Take 1 capsule by mouth daily. 90 capsule 3   aspirin EC 81 MG tablet Take 81 mg by mouth daily.     betamethasone dipropionate (DIPROLENE) 0.05 % ointment USE AS DIRECTED 45 g 5   calcium carbonate (TUMS - DOSED IN MG ELEMENTAL CALCIUM) 500 MG chewable tablet Chew 2 tablets by mouth daily as needed for indigestion or heartburn.     Glucosamine HCl (GLUCOSAMINE PO) Take 1 tablet by mouth daily.     levothyroxine  (SYNTHROID) 100 MCG tablet Take 1 tablet (100 mcg total) by mouth daily before breakfast. 90 tablet 3   Multiple Vitamins-Minerals (MULTIVITAMIN WITH MINERALS) tablet Take 1 tablet by mouth daily.       oxyCODONE (OXY IR/ROXICODONE) 5 MG immediate release tablet Take 0.5-1 tablets (2.5-5 mg total) by mouth every 6 (six) hours as needed for severe pain. (Patient not taking: No sig reported) 10 tablet 0   predniSONE (STERAPRED UNI-PAK 48 TAB) 10 MG (48) TBPK tablet Take as per manufacturer's recommendation (Patient not taking: No sig reported) 48 tablet 0   vitamin E 400 UNIT capsule Take 400 Units by mouth daily.     No current facility-administered medications for this visit.   Facility-Administered Medications Ordered in Other Visits  Medication Dose Route Frequency Provider Last Rate Last Admin   influenza  inactive virus vaccine (FLUZONE/FLUARIX) injection 0.5 mL  0.5 mL Intramuscular Once [name], MD         Allergies: No Known Allergies    Physical Exam:     Blood pressure (!) 181/61, pulse 70, temperature 98.6 F (37 C), temperature source Oral, resp. rate 17, height $RemoveBe'5\' 8"'GiWrOEUOg$  (1.727 m), weight 138 lb 8 oz (62.8 kg), SpO2 98 %.       ECOG: 1   General appearance: Comfortable appearing without any discomfort Head: Normocephalic without any trauma Oropharynx: Mucous membranes are moist and pink without any thrush or ulcers. Eyes: Pupils are equal and round reactive  to light. Lymph nodes: No cervical, supraclavicular, inguinal or axillary lymphadenopathy.   Heart:regular rate and rhythm.  S1 and S2 without leg edema. Lung: Clear without any rhonchi or wheezes.  No dullness to percussion. Abdomin: Soft, nontender, nondistended with good bowel sounds.  No hepatosplenomegaly. Musculoskeletal: No joint deformity or effusion.  Full range of motion noted. Neurological: No deficits noted on motor, sensory and deep tendon reflex exam. Skin: No petechial rash or dryness.   Appeared moist.                      Lab Results: Lab Results  Component Value Date   WBC 7.2 10/14/2020   HGB 15.9 10/14/2020   HCT 45.3 10/14/2020   MCV 92 10/14/2020   PLT 412 10/14/2020     Chemistry      Component Value Date/Time   NA 132 (L) 10/14/2020 1104   K 4.6 10/14/2020 1104   CL 96 10/14/2020 1104   CO2 22 10/14/2020 1104   BUN 12 10/14/2020 1104   CREATININE 0.64 (L) 10/14/2020 1104   CREATININE 0.70 05/30/2019 1243   CREATININE 0.63 (L) 01/05/2017 0934      Component Value Date/Time   CALCIUM 9.1 10/14/2020 1104   ALKPHOS 66 10/14/2020 1104   AST 17 10/14/2020 1104   AST 17 05/30/2019 1243   ALT 9 10/14/2020 1104   ALT 9 05/30/2019 1243   BILITOT 0.6 10/14/2020 1104   BILITOT 0.4 05/30/2019 1243                  Impression and Plan:  87 year old man with:   1.  Advanced squamous cell carcinoma of the skin diagnosed in 2018.  He was found to have axillary and inguinal adenopathy.  He is status post systemic treatment as well as local resection without any evidence of relapsed disease.  Physical examination today overall showed no signs or symptoms of disease relapse.  Salvage therapy options including surgical resection versus retreatment with PD-L1 inhibitors if he developed recurrent disease.   2.  Goals of care and prognosis: His prognosis remain excellent given his excellent response to therapy.  If he developed relapsed disease any treatment will be palliative however    3.  Follow-up: Will be in 6 months for a follow-up visit.  20  minutes were dedicated to this visit.  The time was spent on reviewing his disease status, treatment choices for the future and answering questions regarding prognosis.     [name], MD 8/31/20229:46 AM

## 2021-08-27 DIAGNOSIS — Z20822 Contact with and (suspected) exposure to covid-19: Secondary | ICD-10-CM | POA: Diagnosis not present

## 2021-10-05 ENCOUNTER — Other Ambulatory Visit: Payer: Self-pay | Admitting: Family Medicine

## 2021-10-05 DIAGNOSIS — E039 Hypothyroidism, unspecified: Secondary | ICD-10-CM

## 2021-10-05 DIAGNOSIS — I10 Essential (primary) hypertension: Secondary | ICD-10-CM

## 2021-10-05 DIAGNOSIS — I119 Hypertensive heart disease without heart failure: Secondary | ICD-10-CM

## 2021-10-16 ENCOUNTER — Ambulatory Visit: Payer: Medicare Other | Admitting: Family Medicine

## 2021-10-29 ENCOUNTER — Ambulatory Visit (INDEPENDENT_AMBULATORY_CARE_PROVIDER_SITE_OTHER): Payer: Medicare Other | Admitting: Family Medicine

## 2021-10-29 ENCOUNTER — Other Ambulatory Visit: Payer: Self-pay

## 2021-10-29 ENCOUNTER — Encounter: Payer: Self-pay | Admitting: Family Medicine

## 2021-10-29 VITALS — BP 138/88 | HR 56 | Temp 97.9°F | Ht 67.0 in | Wt 138.4 lb

## 2021-10-29 DIAGNOSIS — Z23 Encounter for immunization: Secondary | ICD-10-CM | POA: Diagnosis not present

## 2021-10-29 DIAGNOSIS — R011 Cardiac murmur, unspecified: Secondary | ICD-10-CM

## 2021-10-29 DIAGNOSIS — I119 Hypertensive heart disease without heart failure: Secondary | ICD-10-CM

## 2021-10-29 DIAGNOSIS — I4891 Unspecified atrial fibrillation: Secondary | ICD-10-CM | POA: Diagnosis not present

## 2021-10-29 DIAGNOSIS — I10 Essential (primary) hypertension: Secondary | ICD-10-CM

## 2021-10-29 DIAGNOSIS — C4492 Squamous cell carcinoma of skin, unspecified: Secondary | ICD-10-CM | POA: Diagnosis not present

## 2021-10-29 DIAGNOSIS — E039 Hypothyroidism, unspecified: Secondary | ICD-10-CM

## 2021-10-29 DIAGNOSIS — Z8679 Personal history of other diseases of the circulatory system: Secondary | ICD-10-CM | POA: Diagnosis not present

## 2021-10-29 MED ORDER — LEVOTHYROXINE SODIUM 100 MCG PO TABS: 100.0000 ug | ORAL_TABLET | Freq: Every day | ORAL | 3 refills | Status: DC

## 2021-10-29 MED ORDER — AMLODIPINE BESY-BENAZEPRIL HCL 10-20 MG PO CAPS: 1.0000 | ORAL_CAPSULE | Freq: Every day | ORAL | 3 refills | Status: DC

## 2021-10-29 MED ORDER — RIVAROXABAN 20 MG PO TABS: 20.0000 mg | ORAL_TABLET | Freq: Every day | ORAL | 1 refills | Status: DC

## 2021-10-29 NOTE — Patient Instructions (Signed)
  Jeffrey Hardin , Thank you for taking time to come for your Medicare Wellness Visit. I appreciate your ongoing commitment to your health goals. Please review the following plan we discussed and let me know if I can assist you in the future.   These are the goals we discussed:  Goals   None     This is a list of the screening recommended for you and due dates:  Health Maintenance  Topic Date Due   Flu Shot  07/27/2021   Pneumonia Vaccine  Completed   HPV Vaccine  Aged Out   Tetanus Vaccine  Discontinued   COVID-19 Vaccine  Discontinued   Zoster (Shingles) Vaccine  Discontinued

## 2021-10-29 NOTE — Progress Notes (Signed)
Jeffrey Hardin is a 87 y.o. male who presents for annual wellness visit and follow-up on chronic medical conditions.  He has no particular concerns or complaints.  He is being followed by oncology for his metastatic squamous cell cancer and seems to be doing fairly well with this.  He continues on amlodipine/benazepril and is also taking Synthroid without difficulty.  Further history from him indicates that he apparently has a remote history of a murmur however it is not in his record.  He also has a previous history of atrial fibrillation.  He was on Eliquis but stopped it on his own because he had unacceptable side effects.  He has not followed up with cardiology in quite some time.  Presently he is not on an anticoagulant.  Discussed placing him on a statin drug and again he is not interested in being placed on a new medication.  He does not complain of chest pain, shortness of breath, PND, weakness, syncopal episodes.  His home life is quite stable.   Immunizations and Health Maintenance Immunization History  Administered Date(s) Administered   Fluad Quad(high Dose 65+) 09/24/2019, 10/14/2020   Influenza Split 09/28/2011, 10/18/2012   Influenza Whole 11/06/1999, 09/23/2008, 09/08/2010   Influenza, High Dose Seasonal PF 11/02/2016, 09/27/2017   Influenza,inj,Quad PF,6+ Mos 09/11/2013   Influenza-Unspecified 10/01/2003, 09/10/2018   PFIZER(Purple Top)SARS-COV-2 Vaccination 01/17/2020, 02/07/2020, 09/04/2020   Pneumococcal Conjugate-13 04/22/2015   Pneumococcal Polysaccharide-23 08/22/2008   Tdap 08/22/2008   Health Maintenance Due  Topic Date Due   INFLUENZA VACCINE  07/27/2021    Last colonoscopy: aged out 08/11/2006 Last PSA: Aged out   Dentist: Q six months Ophtho: Q year  Exercise: yard work QD   Other doctors caring for patient include: Dr. [facility]                                                             Advanced Directives: Does Patient Have a Medical Advance  Directive?: Yes Type of Advance Directive: Living will, Healthcare Power of Attorney, Out of facility DNR (pink MOST or yellow form) Does patient want to make changes to medical advance directive?: No - Patient declined [place] in Chart?: Yes - validated most recent copy scanned in chart (See row information)  Depression screen:  See questionnaire below.     Depression screen [facility] 2/9 10/29/2021 10/14/2020 01/05/2017 12/13/2013 09/07/2012  Decreased Interest 0 0 0 0 0  Down, Depressed, Hopeless 0 0 0 0 0  PHQ - 2 Score 0 0 0 0 0    Fall Screen: See Questionaire below.   Fall Risk  10/29/2021 10/14/2020 01/05/2017 12/13/2013  Falls in the past year? 0 1 No No  Number falls in past yr: 0 1 - -  Injury with Fall? 0 0 - -  Risk for fall due to : No Fall Risks Impaired mobility - -  Follow up Falls evaluation completed - - -    ADL screen:  See questionnaire below.  Functional Status Survey: Is the patient deaf or have difficulty hearing?: No Does the patient have difficulty seeing, even when wearing glasses/contacts?: No Does the patient have difficulty concentrating, remembering, or making decisions?: No Does the patient have difficulty walking or climbing stairs?: Yes Does the patient have difficulty  dressing or bathing?: No Does the patient have difficulty doing errands alone such as visiting a doctor's office or shopping?: No   Review of Systems  Constitutional: -, -unexpected weight change, -anorexia, -fatigue Allergy: -sneezing, -itching, -congestion Dermatology: denies changing moles, rash, lumps ENT: -runny nose, -ear pain, -sore throat,  Cardiology:  -chest pain, -palpitations, -orthopnea, Respiratory: -cough, -shortness of breath, -dyspnea on exertion, -wheezing,  Gastroenterology: -abdominal pain, -nausea, -vomiting, -diarrhea, -constipation, -dysphagia Hematology: -bleeding or bruising problems Musculoskeletal: -arthralgias, -myalgias, -joint  swelling, -back pain, - Ophthalmology: -vision changes,  Urology: -dysuria, -difficulty urinating,  -urinary frequency, -urgency, incontinence Neurology: -, -numbness, , -memory loss, -falls, -dizziness    PHYSICAL EXAM:    General Appearance: Alert, cooperative, no distress, appears stated age Head: Normocephalic, without obvious abnormality, atraumatic Eyes: PERRL, conjunctiva/corneas clear, EOM's intact,  Ears: Normal TM's and external ear canals Nose: Nares normal, mucosa normal, no drainage or sinus   tenderness Throat: Lips, mucosa, and tongue normal; teeth and gums normal Neck: Supple, no lymphadenopathy, thyroid:no enlargement/tenderness/nodules; no carotid bruit or JVD Lungs: Clear to auscultation bilaterally without wheezes, rales or ronchi; respirations unlabored Heart: Regular rate and rhythm, S1 and S2 normal, 2/6 SEM,no rub or gallop Abdomen: Soft, non-tender, nondistended, normoactive bowel sounds, no masses, no hepatosplenomegaly Extremities: No clubbing, cyanosis or edema Pulses: 2+ and symmetric all extremities Skin: Skin color, texture, turgor normal, no rashes or lesions Lymph nodes: Cervical, supraclavicular, and axillary nodes normal Neurologic: CNII-XII intact, normal strength, sensation and gait; reflexes 2+ and symmetric throughout   Psych: Normal mood, affect, hygiene and grooming EKG read by me does show bradycardia as well as atrial fibrillation and 1 PVC. ASSESSMENT/PLAN: Atrial fibrillation, unspecified type ([place]) - Plan: CBC with Differential/Platelet, Comprehensive metabolic panel, Lipid panel, ECHOCARDIOGRAM COMPLETE, EKG 12-Lead, Ambulatory referral to Cardiology, rivaroxaban (XARELTO) 20 MG TABS tablet  Need for influenza vaccination - Plan: Flu Vaccine QUAD High Dose(Fluad)  Hypertensive heart disease without heart failure - Plan: amLODipine-benazepril (LOTREL) 10-20 MG capsule  Squamous cell carcinoma of skin  Essential hypertension - Plan:  amLODipine-benazepril (LOTREL) 10-20 MG capsule  Hypothyroidism, unspecified type - Plan: TSH  Systolic murmur - Plan: CBC with Differential/Platelet, Comprehensive metabolic panel, Lipid panel, ECHOCARDIOGRAM COMPLETE, EKG 12-Lead, Ambulatory referral to Cardiology  Essential (primary) hypertension  - Plan: Lipid panel I got him to agree to go back on a blood thinner and will give him Xarelto.  He will call me if he has any adverse reaction to this.  We will also get an echocardiogram as there is no record of that in his present chart or in his paper chart.  Referral to cardiology was made.  His medications were renewed.  He will continue to be followed by oncology. Stop taking aspirin   Immunization recommendations discussed.  And patient only interested in flu shot.  Colonoscopy recommendations reviewed.   Medicare Attestation I have personally reviewed: The patient's medical and social history Their use of alcohol, tobacco or illicit drugs Their current medications and supplements The patient's functional ability including ADLs,fall risks, home safety risks, cognitive, and hearing and visual impairment Diet and physical activities Evidence for depression or mood disorders  The patient's weight, height, and BMI have been recorded in the chart.  I have made referrals, counseling, and provided education to the patient based on review of the above and I have provided the patient with a written personalized care plan for preventive services.     Jill Alexanders, MD   10/29/2021

## 2021-10-30 ENCOUNTER — Encounter: Payer: Self-pay | Admitting: Family Medicine

## 2021-10-30 LAB — CBC WITH DIFFERENTIAL/PLATELET
Basophils Absolute: 0 10*3/uL (ref 0.0–0.2)
Basos: 1 %
EOS (ABSOLUTE): 0.1 10*3/uL (ref 0.0–0.4)
Eos: 1 %
Hematocrit: 44.8 % (ref 37.5–51.0)
Hemoglobin: 15.6 g/dL (ref 13.0–17.7)
Immature Grans (Abs): 0 10*3/uL (ref 0.0–0.1)
Immature Granulocytes: 0 %
Lymphocytes Absolute: 0.8 10*3/uL (ref 0.7–3.1)
Lymphs: 12 %
MCH: 32.7 pg (ref 26.6–33.0)
MCHC: 34.8 g/dL (ref 31.5–35.7)
MCV: 94 fL (ref 79–97)
Monocytes Absolute: 0.5 10*3/uL (ref 0.1–0.9)
Monocytes: 8 %
Neutrophils Absolute: 5.2 10*3/uL (ref 1.4–7.0)
Neutrophils: 78 %
Platelets: 436 10*3/uL (ref 150–450)
RBC: 4.77 x10E6/uL (ref 4.14–5.80)
RDW: 11.9 % (ref 11.6–15.4)
WBC: 6.6 10*3/uL (ref 3.4–10.8)

## 2021-10-30 LAB — COMPREHENSIVE METABOLIC PANEL
ALT: 11 IU/L (ref 0–44)
AST: 17 IU/L (ref 0–40)
Albumin/Globulin Ratio: 1.3 (ref 1.2–2.2)
Albumin: 3.9 g/dL (ref 3.6–4.6)
Alkaline Phosphatase: 65 IU/L (ref 44–121)
BUN/Creatinine Ratio: 15 (ref 10–24)
BUN: 11 mg/dL (ref 8–27)
Bilirubin Total: 0.6 mg/dL (ref 0.0–1.2)
CO2: 23 mmol/L (ref 20–29)
Calcium: 9.7 mg/dL (ref 8.6–10.2)
Chloride: 96 mmol/L (ref 96–106)
Creatinine, Ser: 0.73 mg/dL — ABNORMAL LOW (ref 0.76–1.27)
Globulin, Total: 2.9 g/dL (ref 1.5–4.5)
Glucose: 101 mg/dL — ABNORMAL HIGH (ref 70–99)
Potassium: 4.5 mmol/L (ref 3.5–5.2)
Sodium: 136 mmol/L (ref 134–144)
Total Protein: 6.8 g/dL (ref 6.0–8.5)
eGFR: 89 mL/min/{1.73_m2} (ref >59)

## 2021-10-30 LAB — LIPID PANEL
Chol/HDL Ratio: 2.2 ratio (ref 0.0–5.0)
Cholesterol, Total: 183 mg/dL (ref 100–199)
HDL: 85 mg/dL (ref >39)
LDL Chol Calc (NIH): 89 mg/dL (ref 0–99)
Triglycerides: 43 mg/dL (ref 0–149)
VLDL Cholesterol Cal: 9 mg/dL (ref 5–40)

## 2021-10-30 LAB — TSH: TSH: 11.6 u[IU]/mL — ABNORMAL HIGH (ref 0.450–4.500)

## 2021-11-29 NOTE — Progress Notes (Deleted)
Cardiology Office Note:    Date:  11/29/2021   ID:  Jeffrey Hardin, DOB May 19, 1934, MRN [id number]  PCP:  [name], MD  Cardiologist:  None  Electrophysiologist:  None   Referring MD: [name], MD   Chief Complaint/Reason for Referral: Establish cardiology care  History of Present Illness:    Jeffrey Hardin is a 87 y.o. male with the indicated history here to establish cardiovascular care.  He was seen by his primary care provider recently.  He was not on anticoagulation despite history of atrial fibrillation and Xarelto was started.  He had no specific complaints at that evaluation.  A murmur was noted and echocardiogram was ordered however this has not yet been performed.  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   Thyroid disease    HYPOTHYROIDISM    Past Surgical History:  Procedure Laterality Date   COLONOSCOPY  2007   Dr.Stark   EXCISION MASS UPPER EXTREMETIES Right 03/21/2019   Procedure: EXCISION OF LARGE RIGHT AXILLARY MASS;  Surgeon: Stark Klein, MD;  Location: [place];  Service: General;  Laterality: Right;   HIP DISARTICULATION Left 1957   sarcoma   MASS EXCISION N/A 01/24/2018   Procedure: EXCISION MALIGNANT  LESION OF ABDOMEN WITH LAYERED CLOSURE;  Surgeon: Irene Limbo, MD;  Location: [place];  Service: Plastics;  Laterality: N/A;    Current Medications: No outpatient medications have been marked as taking for the 11/30/21 encounter (Appointment) with Early Osmond, MD.     Allergies:   No Known Allergies  Social History   Tobacco Use   Smoking status: Never   Smokeless tobacco: Never  Vaping Use    Vaping Use: Never used  Substance Use Topics   Alcohol use: Yes    Alcohol/week: 20.0 standard drinks    Types: 20 Cans of beer per week    Comment: 3 cans a day   Drug use: No     Family History: Family History  Problem Relation Age of Onset   Cancer Sister      ROS:   Please see the history of present illness.    All other systems reviewed and are negative.  EKGs/Labs/Other Studies Reviewed:    The following studies were reviewed today:  EKG: EKG from last month demonstrates atrial fibrillation with a slow ventricular rate and rare PVCs.  Imaging studies that I have independently reviewed today: n/a  Recent Labs: 10/29/2021: ALT 11; BUN 11; Creatinine, Ser 0.73; Hemoglobin 15.6; Platelets 436; Potassium 4.5; Sodium 136; TSH 11.600  Recent Lipid Panel    Component Value Date/Time   CHOL 183 10/29/2021 1049   TRIG 43 10/29/2021 1049   HDL 85 10/29/2021 1049   CHOLHDL 2.2 10/29/2021 1049   CHOLHDL 2.2 01/05/2017 0934   VLDL 10 01/05/2017 0934   LDLCALC 89 10/29/2021 1049   CHADS2-VASc   CHF History No  HTN History Yes  Diabetes History No  Stroke History No  Vascular Disease History No  Calculated [age]  Calculated Age [age]  Calculated CHADS Age 44  Gender Calculation 2  Age Score 2  Gender Score 0  CHADS2-VASc Score 3  Annual Stroke Risk % 3.2    Physical Exam:    VS:  There were no vitals taken for this visit.    Wt Readings from Last 5 Encounters:  10/29/21 138 lb 6.4 oz (62.8 kg)  08/26/21 138 lb 8 oz (62.8 kg)  06/15/21 136 lb (61.7 kg)  01/27/21 137 lb 4.8 oz (62.3 kg)  10/14/20 134 lb (60.8 kg)    GENERAL:  No apparent distress, AOx3 HEENT:  No carotid bruits, +2 carotid impulses, no scleral icterus CAR: RRR no murmurs, gallops, rubs, or thrills RES:  Clear to auscultation bilaterally ABD:  Soft, nontender, nondistended, positive bowel sounds x 4 VASC:  +2 radial pulses, +2 carotid pulses, palpable pedal pulses NEURO:  CN 2-12  grossly intact; motor and sensory grossly intact PSYCH:  No active depression or anxiety EXT:  No edema, ecchymosis, or cyanosis  ASSESSMENT:    1. History of atrial fibrillation   2. Murmur   3. Essential hypertension   4. Hypothyroidism, unspecified type    PLAN:    History of atrial fibrillation  Murmur  Essential hypertension  Hypothyroidism, unspecified type  The patient is on Xarelto.  He is in fact bradycardic.  Continue current therapy with no rate controlling agents.  We will obtain an echocardiogram to evaluate further.  Continue current therapy.  Continue current therapy.  TSH was high and this is being followed by his primary care provider.   Total time spent with patient today *** minutes. This includes reviewing records, evaluating the patient and coordinating care.   Shared Decision Making/Informed Consent:   {Are you ordering a CV Procedure (e.g. stress test, cath, DCCV, TEE, etc)?   Press F2        :[id number]}   Medication Adjustments/Labs and Tests Ordered: Current medicines are reviewed at length with the patient today.  Concerns regarding medicines are outlined above.   No orders of the defined types were placed in this encounter.   No orders of the defined types were placed in this encounter.   There are no Patient Instructions on file for this visit.

## 2021-11-30 ENCOUNTER — Ambulatory Visit (INDEPENDENT_AMBULATORY_CARE_PROVIDER_SITE_OTHER): Payer: Medicare Other | Admitting: Internal Medicine

## 2021-11-30 DIAGNOSIS — R011 Cardiac murmur, unspecified: Secondary | ICD-10-CM

## 2021-11-30 DIAGNOSIS — E039 Hypothyroidism, unspecified: Secondary | ICD-10-CM

## 2021-11-30 DIAGNOSIS — I10 Essential (primary) hypertension: Secondary | ICD-10-CM

## 2021-11-30 DIAGNOSIS — Z8679 Personal history of other diseases of the circulatory system: Secondary | ICD-10-CM

## 2022-02-24 ENCOUNTER — Other Ambulatory Visit: Payer: Self-pay

## 2022-02-24 ENCOUNTER — Inpatient Hospital Stay: Payer: Medicare Other | Attending: Oncology | Admitting: Oncology

## 2022-02-24 VITALS — BP 137/58 | HR 61 | Temp 98.0°F | Resp 16 | Ht 67.0 in | Wt 135.5 lb

## 2022-02-24 DIAGNOSIS — Z85828 Personal history of other malignant neoplasm of skin: Secondary | ICD-10-CM | POA: Insufficient documentation

## 2022-02-24 DIAGNOSIS — C4492 Squamous cell carcinoma of skin, unspecified: Secondary | ICD-10-CM

## 2022-02-24 DIAGNOSIS — Z9221 Personal history of antineoplastic chemotherapy: Secondary | ICD-10-CM | POA: Insufficient documentation

## 2022-02-24 NOTE — Progress Notes (Signed)
Hematology and Oncology Follow Up Visit ? ?Jeffrey Hardin ?[id number] ?1934-08-28 88 y.o. ?02/24/2022 10:00 AM ?[name], MDLalonde, [name], MD  ? ?Principle Diagnosis: 88 year old man with squamous cell carcinoma of the skin diagnosed in 2018.  He developed advanced disease with axillary and inguinal adenopathy.  ? ? ?Prior Therapy: ?He underwent excision of a malignant lesion measuring 11 x 10 cm and layered closure completed on January 24, 2018 by Dr. Iran Planas.  The final pathology showed invasive squamous cell carcinoma with moderate differentiation measuring 8.8 cm with negative margins.   ? ?He is status post excision of a axillary mass completed by Dr. Barry Dienes on March 21, 2019.  Final pathology showed moderate to poorly differentiated squamous cell carcinoma. ? ?Libtayo 350 mg every 3 weeks started on 06/23/2018.  He completed 12 cycles of therapy on March 10 of 2020.  Therapy concluded in June 2020. ? ?Current therapy: Active surveillance. ? ? ?  ? ?Interim History: Jeffrey Hardin returns today for follow-up visit.  Since the last visit, he reports no major changes in his health.  He continues to enjoy excellent quality of life and reasonable performance status.  He continues to travel regularly on cruise ships.  He denies any lymphadenopathy in the axilla or inguinal areas.  He denies any excessive fatigue or tiredness.  He denies any constitutional symptoms. ? ? ? ? ? ? ? ? ? ? ?Medications: Reviewed without changes. ?Current Outpatient Medications  ?Medication Sig Dispense Refill  ? amLODipine-benazepril (LOTREL) 10-20 MG capsule Take 1 capsule by mouth daily. 90 capsule 3  ? aspirin EC 81 MG tablet Take 81 mg by mouth daily.    ? betamethasone dipropionate (DIPROLENE) 0.05 % ointment USE AS DIRECTED 45 g 5  ? calcium carbonate (TUMS - DOSED IN MG ELEMENTAL CALCIUM) 500 MG chewable tablet Chew 2 tablets by mouth daily as needed for indigestion or heartburn.    ? Glucosamine HCl (GLUCOSAMINE PO) Take 1  tablet by mouth daily.    ? levothyroxine (SYNTHROID) 100 MCG tablet Take 1 tablet (100 mcg total) by mouth daily before breakfast. 90 tablet 3  ? Multiple Vitamins-Minerals (MULTIVITAMIN WITH MINERALS) tablet Take 1 tablet by mouth daily.      ? oxyCODONE (OXY IR/ROXICODONE) 5 MG immediate release tablet Take 0.5-1 tablets (2.5-5 mg total) by mouth every 6 (six) hours as needed for severe pain. (Patient not taking: No sig reported) 10 tablet 0  ? predniSONE (STERAPRED UNI-PAK 48 TAB) 10 MG (48) TBPK tablet Take as per manufacturer's recommendation (Patient not taking: No sig reported) 48 tablet 0  ? rivaroxaban (XARELTO) 20 MG TABS tablet Take 1 tablet (20 mg total) by mouth daily with supper. 30 tablet 1  ? vitamin E 400 UNIT capsule Take 400 Units by mouth daily.    ? ?No current facility-administered medications for this visit.  ? ?Facility-Administered Medications Ordered in Other Visits  ?Medication Dose Route Frequency Provider Last Rate Last Admin  ? influenza  inactive virus vaccine (FLUZONE/FLUARIX) injection 0.5 mL  0.5 mL Intramuscular Once [name], MD      ? ? ? ?Allergies: No Known Allergies ? ? ? ?Physical Exam: ? ? ? ? ? ?Blood pressure (!) 137/58, pulse 61, temperature 98 ?F (36.7 ?C), temperature source Temporal, resp. rate 16, height 5\' 7"  (1.702 m), weight 135 lb 8 oz (61.5 kg), SpO2 98 %. ? ? ? ? ? ? ?ECOG: 1 ? ? ? ?General appearance: Alert, awake without  any distress. ?Head: Atraumatic without abnormalities ?Oropharynx: Without any thrush or ulcers. ?Eyes: No scleral icterus. ?Lymph nodes: No lymphadenopathy noted in the cervical, supraclavicular, or axillary nodes ?Heart:regular rate and rhythm, without any murmurs or gallops.   ?Lung: Clear to auscultation without any rhonchi, wheezes or dullness to percussion. ?Abdomin: Soft, nontender without any shifting dullness or ascites. ?Musculoskeletal: No clubbing or cyanosis. ?Neurological: No motor or sensory deficits. ?Skin: No rashes or  lesions. ? ? ? ? ? ? ? ? ? ? ? ? ?  ? ? ? ? ? ? ? ?Lab Results: ?Lab Results  ?Component Value Date  ? WBC 6.6 10/29/2021  ? HGB 15.6 10/29/2021  ? HCT 44.8 10/29/2021  ? MCV 94 10/29/2021  ? PLT 436 10/29/2021  ? ?  Chemistry   ?   ?Component Value Date/Time  ? NA 136 10/29/2021 1049  ? K 4.5 10/29/2021 1049  ? CL 96 10/29/2021 1049  ? CO2 23 10/29/2021 1049  ? BUN 11 10/29/2021 1049  ? CREATININE 0.73 (L) 10/29/2021 1049  ? CREATININE 0.70 05/30/2019 1243  ? CREATININE 0.63 (L) 01/05/2017 0934  ?    ?Component Value Date/Time  ? CALCIUM 9.7 10/29/2021 1049  ? ALKPHOS 65 10/29/2021 1049  ? AST 17 10/29/2021 1049  ? AST 17 05/30/2019 1243  ? ALT 11 10/29/2021 1049  ? ALT 9 05/30/2019 1243  ? BILITOT 0.6 10/29/2021 1049  ? BILITOT 0.4 05/30/2019 1243  ?  ? ?  ?  ? ?   ?  ? ?  ?Impression and Plan: ? ?88 year old man with: ? ? ?1.  Squamous cell carcinoma of the skin diagnosed in 2018.  He developed advanced disease with axillary and inguinal adenopathy.  ? ? ?His disease status was updated at this time and treatment versus were reviewed.  He continues to have stable disease clinically without any indication for restarting treatments.  He has opted against imaging studies or restart of treatment unless absolutely necessary.  Showing excellent quality of life and would like not to disturb.  We will continue to monitor clinically and repeat evaluation in 6 months.  We will obtain imaging studies if he has any symptoms. ? ?Alternative treatment options including Libtayo which could be restarted in the future if needed. ? ? ? ?2.  Goals of care and prognosis: His disease is incurable and likely will relapse and require treatment at some point.  Aggressive measures remains warranted given his reasonable performance status. ?   ?3.  Follow-up: In 6 months for repeat follow-up. ? ?30  minutes were spent on this encounter.  The time was dedicated to reviewing laboratory data, disease status update and outlining future plan of  care discussion. ? ? ? ? ?[name], MD ?3/1/202310:00 AM ?

## 2022-06-09 ENCOUNTER — Other Ambulatory Visit: Payer: Self-pay | Admitting: Family Medicine

## 2022-06-09 MED ORDER — AMLODIPINE BESY-BENAZEPRIL HCL 5-10 MG PO CAPS: 2.0000 | ORAL_CAPSULE | Freq: Every day | ORAL | 3 refills | Status: DC

## 2022-06-14 ENCOUNTER — Telehealth: Payer: Self-pay | Admitting: Oncology

## 2022-06-14 NOTE — Telephone Encounter (Signed)
Called patient regarding upcoming August appointment, patient has been called and notified.  

## 2022-06-23 ENCOUNTER — Telehealth: Payer: Self-pay | Admitting: Oncology

## 2022-06-23 NOTE — Telephone Encounter (Signed)
Called patient regarding upcoming August appointments, patient has been called and voicemail was left. 

## 2022-08-25 ENCOUNTER — Inpatient Hospital Stay: Payer: Medicare Other | Attending: Oncology | Admitting: Oncology

## 2022-08-25 ENCOUNTER — Other Ambulatory Visit: Payer: Self-pay

## 2022-08-25 VITALS — BP 167/66 | HR 44 | Temp 97.7°F | Resp 15 | Wt 124.7 lb

## 2022-08-25 DIAGNOSIS — Z79899 Other long term (current) drug therapy: Secondary | ICD-10-CM | POA: Diagnosis not present

## 2022-08-25 DIAGNOSIS — C4492 Squamous cell carcinoma of skin, unspecified: Secondary | ICD-10-CM | POA: Diagnosis not present

## 2022-08-25 DIAGNOSIS — Z7901 Long term (current) use of anticoagulants: Secondary | ICD-10-CM | POA: Diagnosis not present

## 2022-08-25 DIAGNOSIS — Z7982 Long term (current) use of aspirin: Secondary | ICD-10-CM | POA: Insufficient documentation

## 2022-08-25 DIAGNOSIS — Z7952 Long term (current) use of systemic steroids: Secondary | ICD-10-CM | POA: Insufficient documentation

## 2022-08-25 DIAGNOSIS — Z85828 Personal history of other malignant neoplasm of skin: Secondary | ICD-10-CM | POA: Insufficient documentation

## 2022-08-25 NOTE — Progress Notes (Signed)
Hematology and Oncology Follow Up Visit  Jeffrey Hardin [id number] Jun 18, 1934 88 y.o. 08/25/2022 10:14 AM [name], MDLalonde, [name], MD   Principle Diagnosis: 88 year old man with advanced squamous cell carcinoma of the skin with axillary and inguinal adenopathy diagnosed in 2018.  He achieved near complete response to immunotherapy.   Prior Therapy: He underwent excision of a malignant lesion measuring 11 x 10 cm and layered closure completed on January 24, 2018 by Dr. Iran Planas.  The final pathology showed invasive squamous cell carcinoma with moderate differentiation measuring 8.8 cm with negative margins.    He is status post excision of a axillary mass completed by Dr. Barry Dienes on March 21, 2019.  Final pathology showed moderate to poorly differentiated squamous cell carcinoma.  Libtayo 350 mg every 3 weeks started on 06/23/2018.  He completed 12 cycles of therapy on March 10 of 2020.  Therapy concluded in June 2020.  Current therapy: Active surveillance.      Interim History: [name] presents today for a follow-up evaluation.  Since last visit, he reports no major changes in his health.  He continues to be reasonably active without any decline in his performance status and quality of life.  He continues to travel on cruises without any decline in ability to do so.  He denies any lymphadenopathy or masses.  He denies any axillary or inguinal node enlargement.          Medications: Updated on review. Current Outpatient Medications  Medication Sig Dispense Refill   amLODipine-benazepril (LOTREL) 5-10 MG capsule Take 2 capsules by mouth daily. 180 capsule 3   aspirin EC 81 MG tablet Take 81 mg by mouth daily.     betamethasone dipropionate (DIPROLENE) 0.05 % ointment USE AS DIRECTED 45 g 5   calcium carbonate (TUMS - DOSED IN MG ELEMENTAL CALCIUM) 500 MG chewable tablet Chew 2 tablets by mouth daily as needed for indigestion or heartburn.     Glucosamine HCl  (GLUCOSAMINE PO) Take 1 tablet by mouth daily.     levothyroxine (SYNTHROID) 100 MCG tablet Take 1 tablet (100 mcg total) by mouth daily before breakfast. 90 tablet 3   Multiple Vitamins-Minerals (MULTIVITAMIN WITH MINERALS) tablet Take 1 tablet by mouth daily.       oxyCODONE (OXY IR/ROXICODONE) 5 MG immediate release tablet Take 0.5-1 tablets (2.5-5 mg total) by mouth every 6 (six) hours as needed for severe pain. (Patient not taking: No sig reported) 10 tablet 0   predniSONE (STERAPRED UNI-PAK 48 TAB) 10 MG (48) TBPK tablet Take as per manufacturer's recommendation (Patient not taking: No sig reported) 48 tablet 0   rivaroxaban (XARELTO) 20 MG TABS tablet Take 1 tablet (20 mg total) by mouth daily with supper. 30 tablet 1   vitamin E 400 UNIT capsule Take 400 Units by mouth daily.     No current facility-administered medications for this visit.   Facility-Administered Medications Ordered in Other Visits  Medication Dose Route Frequency Provider Last Rate Last Admin   influenza  inactive virus vaccine (FLUZONE/FLUARIX) injection 0.5 mL  0.5 mL Intramuscular Once [name], MD         Allergies: No Known Allergies    Physical Exam:       Blood pressure (!) 167/66, pulse (!) 44, temperature 97.7 F (36.5 C), temperature source Oral, resp. rate 15, weight 124 lb 11.2 oz (56.6 kg), SpO2 98 %.       ECOG: 1   General appearance: Comfortable appearing without  any discomfort Head: Normocephalic without any trauma Oropharynx: Mucous membranes are moist and pink without any thrush or ulcers. Eyes: Pupils are equal and round reactive to light. Lymph nodes: No cervical, supraclavicular, inguinal or axillary lymphadenopathy.   Heart:regular rate and rhythm.  S1 and S2 without leg edema. Lung: Clear without any rhonchi or wheezes.  No dullness to percussion. Abdomin: Soft, nontender, nondistended with good bowel sounds.  No hepatosplenomegaly. Musculoskeletal: No joint  deformity or effusion.  Full range of motion noted. Neurological: No deficits noted on motor, sensory and deep tendon reflex exam. Skin: No petechial rash or dryness.  Appeared moist.                        Lab Results: Lab Results  Component Value Date   WBC 6.6 10/29/2021   HGB 15.6 10/29/2021   HCT 44.8 10/29/2021   MCV 94 10/29/2021   PLT 436 10/29/2021     Chemistry      Component Value Date/Time   NA 136 10/29/2021 1049   K 4.5 10/29/2021 1049   CL 96 10/29/2021 1049   CO2 23 10/29/2021 1049   BUN 11 10/29/2021 1049   CREATININE 0.73 (L) 10/29/2021 1049   CREATININE 0.70 05/30/2019 1243   CREATININE 0.63 (L) 01/05/2017 0934      Component Value Date/Time   CALCIUM 9.7 10/29/2021 1049   ALKPHOS 65 10/29/2021 1049   AST 17 10/29/2021 1049   AST 17 05/30/2019 1243   ALT 11 10/29/2021 1049   ALT 9 05/30/2019 1243   BILITOT 0.6 10/29/2021 1049   BILITOT 0.4 05/30/2019 1243                  Impression and Plan:  89 year old man with:   1.  Advanced squamous cell carcinoma of the skin with axillary and inguinal adenopathy diagnosed in 2018.    The natural course of his disease was discussed and treatment choices were reviewed.  He opted against continuing immunotherapy after complete response which is reasonable.  Restarting Libtayo could be considered if he has disease progression.  I recommended continued active surveillance and we will repeat imaging studies if he develops symptomatic progression.  Immunotherapy complications associated with this treatment were discussed.  He is agreeable to continue with follow-up.    2.  Goals of care and prognosis: His disease is incurable but he had an excellent response to therapy and repeat aggressive measures are warranted.    3.  Follow-up: He will return in 6 months for a follow-up.  30  minutes were spent on this visit.  Time was dedicated to reviewing disease status update, treatment choices  and addressing complication related to cancer and cancer therapy.     [name], MD 8/30/202310:14 AM

## 2022-10-26 ENCOUNTER — Telehealth: Payer: Self-pay | Admitting: Family Medicine

## 2022-10-26 NOTE — Telephone Encounter (Signed)
Left message for patient to call back and schedule Medicare Annual Wellness Visit (AWV) either virtually or in office. Left  my Herbie Drape number [phone]   Last AWV 10/29/21 please schedule with Nurse Health Adviser   45 min for awv-i and in office appointments 30 min for awv-s  phone/virtual appointments

## 2022-11-05 ENCOUNTER — Ambulatory Visit (INDEPENDENT_AMBULATORY_CARE_PROVIDER_SITE_OTHER): Payer: Medicare Other

## 2022-11-05 VITALS — Ht 68.0 in | Wt 130.0 lb

## 2022-11-05 DIAGNOSIS — Z0000 Encounter for general adult medical examination without abnormal findings: Secondary | ICD-10-CM

## 2022-11-05 NOTE — Progress Notes (Signed)
I connected with Jeffrey Hardin today by telephone and verified that I am speaking with the correct person using two identifiers. Location patient: home Location provider: work Persons participating in the virtual visit: Bookert Aboubacar, Matsuo LPN.   I discussed the limitations, risks, security and privacy concerns of performing an evaluation and management service by telephone and the availability of in person appointments. I also discussed with the patient that there may be a patient responsible charge related to this service. The patient expressed understanding and verbally consented to this telephonic visit.    Interactive audio and video telecommunications were attempted between this provider and patient, however failed, due to patient having technical difficulties OR patient did not have access to video capability.  We continued and completed visit with audio only.     Vital signs may be patient reported or missing.  Subjective:   Jeffrey Hardin is a 88 y.o. male who presents for Medicare Annual/Subsequent preventive examination.  Review of Systems     Cardiac Risk Factors include: advanced age (>32mn, >>57women);dyslipidemia;hypertension;male gender     Objective:    Today's Vitals   11/05/22 0926  Weight: 130 lb (59 kg)  Height: 5' 8"  (1.727 m)   Body mass index is 19.77 kg/m.     11/05/2022    9:30 AM 10/29/2021    9:35 AM 10/14/2020   10:07 AM 01/10/2020    2:52 PM 03/21/2019    9:06 AM 03/20/2019    2:50 PM 03/15/2019    9:14 AM  Advanced Directives  Does Patient Have a Medical Advance Directive? Yes Yes Yes Yes Yes Yes Yes  Type of AParamedicof AFrontierLiving will Living will;Healthcare Power of [place] of facility DNR (pink MOST or yellow form) Living will  HKauf[place]Living will HWadeLiving will HBladesLiving will  Does patient want to make changes to medical  advance directive?  No - Patient declined No - Patient declined  No - Patient declined No - Patient declined No - Patient declined  Copy of HPort [place] Chart? Yes - validated most recent copy scanned in chart (See row information) Yes - validated most recent copy scanned in chart (See row information)   Yes - validated most recent copy scanned in chart (See row information) No - copy requested No - copy requested    Current Medications (verified) Outpatient Encounter Medications as of 11/05/2022  Medication Sig   amLODipine-benazepril (LOTREL) 5-10 MG capsule Take 2 capsules by mouth daily.   aspirin EC 81 MG tablet Take 81 mg by mouth daily.   betamethasone dipropionate (DIPROLENE) 0.05 % ointment USE AS DIRECTED   calcium carbonate (TUMS - DOSED IN MG ELEMENTAL CALCIUM) 500 MG chewable tablet Chew 2 tablets by mouth daily as needed for indigestion or heartburn.   Glucosamine HCl (GLUCOSAMINE PO) Take 1 tablet by mouth daily.   levothyroxine (SYNTHROID) 100 MCG tablet Take 1 tablet (100 mcg total) by mouth daily before breakfast.   Multiple Vitamins-Minerals (MULTIVITAMIN WITH MINERALS) tablet Take 1 tablet by mouth daily.     rivaroxaban (XARELTO) 20 MG TABS tablet Take 1 tablet (20 mg total) by mouth daily with supper.   vitamin E 400 UNIT capsule Take 400 Units by mouth daily.   oxyCODONE (OXY IR/ROXICODONE) 5 MG immediate release tablet Take 0.5-1 tablets (2.5-5 mg total) by mouth every 6 (six) hours as needed for severe pain. (Patient not taking: Reported on  09/24/2019)   predniSONE (STERAPRED UNI-PAK 48 TAB) 10 MG (48) TBPK tablet Take as per manufacturer's recommendation (Patient not taking: Reported on 10/14/2020)   Facility-Administered Encounter Medications as of 11/05/2022  Medication   influenza  inactive virus vaccine (FLUZONE/FLUARIX) injection 0.5 mL    Allergies (verified) Patient has no known allergies.   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   Thyroid disease    HYPOTHYROIDISM   Past Surgical History:  Procedure Laterality Date   COLONOSCOPY  2007   Dr.Stark   EXCISION MASS UPPER EXTREMETIES Right 03/21/2019   Procedure: EXCISION OF LARGE RIGHT AXILLARY MASS;  Surgeon: Stark Klein, MD;  Location: [facility];  Service: General;  Laterality: Right;   HIP DISARTICULATION Left 1957   sarcoma   MASS EXCISION N/A 01/24/2018   Procedure: EXCISION MALIGNANT  LESION OF ABDOMEN WITH LAYERED CLOSURE;  Surgeon: Irene Limbo, MD;  Location: [facility];  Service: Plastics;  Laterality: N/A;   Family History  Problem Relation Age of Onset   Cancer Sister    Social History   Socioeconomic History   Marital status: Married    Spouse name: Not on file   Number of children: Not on file   Years of education: Not on file   Highest education level: Not on file  Occupational History   Not on file  Tobacco Use   Smoking status: Never   Smokeless tobacco: Never  Vaping Use   Vaping Use: Never used  Substance and Sexual Activity   Alcohol use: Yes    Alcohol/week: 20.0 standard drinks of alcohol    Types: 20 Cans of beer per week    Comment: 3 cans a day   Drug use: No   Sexual activity: Not Currently  Other Topics Concern   Not on file  Social History Narrative   Not on file   Social Determinants of Health   Financial Resource Strain: Low Risk  (11/05/2022)   Overall Financial Resource Strain (CARDIA)    Difficulty of Paying Living Expenses: Not hard at all  Food Insecurity: No Food Insecurity (11/05/2022)   Hunger Vital Sign    Worried About Running Out of Food  in the Last Year: Never true    Ran Out of Food in the Last Year: Never true  Transportation Needs: No Transportation Needs (11/05/2022)   PRAPARE - Hydrologist (Medical): No    Lack of Transportation (Non-Medical): No  Physical Activity: Inactive (11/05/2022)   Exercise Vital Sign    Days of Exercise per Week: 0 days    Minutes of Exercise per Session: 0 min  Stress: No Stress Concern Present (11/05/2022)   [place]    Feeling of Stress : Not at all  Social Connections: Not on file    Tobacco Counseling Counseling given: Not Answered   Clinical Intake:  Pre-visit preparation completed: Yes  Pain : No/denies pain     Nutritional Status: BMI of 19-24  Normal Nutritional Risks: None Diabetes: No  How often do you need to have someone help you when you read instructions, pamphlets, or other written materials from your doctor or pharmacy?: 1 - Never  Diabetic? no  Interpreter Needed?: No  Information entered by :: NAllen LPN   Activities of Daily Living    11/05/2022    9:32 AM  In your present state of health, do you have any difficulty performing the following activities:  Hearing? 1  Comment decreased hearing in one ear  Vision? 0  Difficulty concentrating or making decisions? 0  Walking or climbing stairs? 1  Comment artifical leg  Dressing or bathing? 0  Doing errands, shopping? 0  Preparing Food and eating ? N  Using the Toilet? N  In the past six months, have you accidently leaked urine? Y  Comment at night  Do you have problems with loss of bowel control? N  Managing your Medications? N  Managing your Finances? N  Housekeeping or managing your Housekeeping? N    Patient Care Team: [name], MD as PCP - General (Family Medicine)  Indicate any recent Medical Services you may have received from other than [facility] providers in the past year (date may  be approximate).     Assessment:   This is a routine wellness examination for [place].  Hearing/Vision screen Vision Screening - Comments:: No regular eye exams, [facility]  Dietary issues and exercise activities discussed: Current Exercise Habits: The patient does not participate in regular exercise at present   Goals Addressed             This Visit's Progress    Patient Stated       11/05/2022, no goals       Depression Screen    11/05/2022    9:32 AM 10/29/2021    9:31 AM 10/14/2020   10:08 AM 01/05/2017    8:41 AM 12/13/2013   11:18 AM 09/07/2012    9:22 AM  PHQ 2/9 Scores  PHQ - 2 Score 0 0 0 0 0 0  PHQ- 9 Score 0         Fall Risk    11/05/2022    9:31 AM 10/29/2021    9:31 AM 10/14/2020   10:08 AM 01/05/2017    8:41 AM 12/13/2013   11:18 AM  Fall Risk   Falls in the past year? 1 0 1 No No  Comment trips      Number falls in past yr: 1 0 1    Injury with Fall? 0 0 0    Risk for fall due to : Medication side effect;Impaired balance/gait;History of fall(s) No Fall Risks Impaired mobility    Follow up Falls prevention discussed;Falls evaluation completed;Education provided Falls evaluation completed       FALL RISK PREVENTION PERTAINING TO THE HOME:  Any stairs in or around the home? Yes  If so, are there any without handrails? No  Home free of loose throw rugs in walkways, pet beds, electrical cords, etc? Yes  Adequate lighting in your home to reduce risk of falls? Yes   ASSISTIVE DEVICES UTILIZED TO PREVENT FALLS:  Life alert? No  Use of a cane, walker or w/c?  Artificial leg Grab bars in the bathroom? No  Shower chair or bench in shower? Yes  Elevated toilet seat or a handicapped toilet? Yes   TIMED UP AND GO:  Was the test performed? No .      Cognitive Function:        11/05/2022  9:34 AM  6CIT Screen  What Year? 0 points  What month? 0 points  What time? 0 points  Count back from 20 0 points  Months in reverse 0 points   Repeat phrase 0 points  Total Score 0 points    Immunizations Immunization History  Administered Date(s) Administered   Fluad Quad(high Dose 65+) 09/24/2019, 10/14/2020, 10/29/2021   Influenza Split 09/28/2011, 10/18/2012   Influenza Whole 11/06/1999, 09/23/2008, 09/08/2010   Influenza, High Dose Seasonal PF 11/02/2016, 09/27/2017   Influenza,inj,Quad PF,6+ Mos 09/11/2013   Influenza-Unspecified 10/01/2003, 09/10/2018   PFIZER(Purple Top)SARS-COV-2 Vaccination 01/17/2020, 02/07/2020, 09/04/2020   Pneumococcal Conjugate-13 04/22/2015   Pneumococcal Polysaccharide-23 08/22/2008   Tdap 08/22/2008    TDAP status: Due, Education has been provided regarding the importance of this vaccine. Advised may receive this vaccine at local pharmacy or Health Dept. Aware to provide a copy of the vaccination record if obtained from local pharmacy or Health Dept. Verbalized acceptance and understanding.  Flu Vaccine status: Due, Education has been provided regarding the importance of this vaccine. Advised may receive this vaccine at local pharmacy or Health Dept. Aware to provide a copy of the vaccination record if obtained from local pharmacy or Health Dept. Verbalized acceptance and understanding.  Pneumococcal vaccine status: Up to date  Covid-19 vaccine status: Completed vaccines  Qualifies for Shingles Vaccine? Yes   Zostavax completed No   Shingrix Completed?: No.    Education has been provided regarding the importance of this vaccine. Patient has been advised to call insurance company to determine out of pocket expense if they have not yet received this vaccine. Advised may also receive vaccine at local pharmacy or Health Dept. Verbalized acceptance and understanding.  Screening Tests Health Maintenance  Topic Date Due   INFLUENZA VACCINE  07/27/2022   Medicare Annual Wellness (AWV)  10/29/2022   Pneumonia Vaccine 69+ Years old  Completed   HPV VACCINES  Aged Out   TETANUS/TDAP   Discontinued   COVID-19 Vaccine  Discontinued   Zoster Vaccines- Shingrix  Discontinued    Health Maintenance  Health Maintenance Due  Topic Date Due   INFLUENZA VACCINE  07/27/2022   Medicare Annual Wellness (AWV)  10/29/2022    Colorectal cancer screening: No longer required.   Lung Cancer Screening: (Low Dose CT Chest recommended if Age 90-80 years, 30 pack-year currently smoking OR have quit w/in 15years.) does not qualify.   Lung Cancer Screening Referral: no  Additional Screening:  Hepatitis C Screening: does not qualify;   Vision Screening: Recommended annual ophthalmology exams for early detection of glaucoma and other disorders of the eye. Is the patient up to date with their annual eye exam?  Yes  Who is the provider or what is the name of the office in which the patient attends annual eye exams? [facility] If pt is not established with a provider, would they like to be referred to a provider to establish care? No .   Dental Screening: Recommended annual dental exams for proper oral hygiene  Community Resource Referral / Chronic Care Management: CRR required this visit?  No   CCM required this visit?  No      Plan:     I have personally reviewed and noted the following in the patient's chart:   Medical and social history Use of alcohol, tobacco or illicit drugs  Current medications and supplements including opioid prescriptions. Patient is not currently taking opioid prescriptions. Functional ability and status Nutritional status Physical activity Advanced directives List  of other physicians Hospitalizations, surgeries, and ER visits in previous 12 months Vitals Screenings to include cognitive, depression, and falls Referrals and appointments  In addition, I have reviewed and discussed with patient certain preventive protocols, quality metrics, and best practice recommendations. A written personalized care plan for preventive services as well as general  preventive health recommendations were provided to patient.     Kellie Simmering, LPN   [id number]   Nurse Notes: none  Due to this being a virtual visit, the after visit summary with patients personalized plan was offered to patient via mail or my-chart.  to pick up at office at next visit

## 2022-11-05 NOTE — Patient Instructions (Signed)
Jeffrey Hardin , Thank you for taking time to come for your Medicare Wellness Visit. I appreciate your ongoing commitment to your health goals. Please review the following plan we discussed and let me know if I can assist you in the future.   Screening recommendations/referrals: Colonoscopy: not required Recommended yearly ophthalmology/optometry visit for glaucoma screening and checkup Recommended yearly dental visit for hygiene and checkup  Vaccinations: Influenza vaccine: due Pneumococcal vaccine: completed 04/22/2015 Tdap vaccine: n/a Shingles vaccine: n/a   Covid-19: 09/04/2020, 02/07/2020, 01/17/2020  Advanced directives: copy in chart  Conditions/risks identified: none  Next appointment: Follow up in one year for your annual wellness visit.   Preventive Care 86 Years and Older, Male Preventive care refers to lifestyle choices and visits with your health care provider that can promote health and wellness. What does preventive care include? A yearly physical exam. This is also called an annual well check. Dental exams once or twice a year. Routine eye exams. Ask your health care provider how often you should have your eyes checked. Personal lifestyle choices, including: Daily care of your teeth and gums. Regular physical activity. Eating a healthy diet. Avoiding tobacco and drug use. Limiting alcohol use. Practicing safe sex. Taking low doses of aspirin every day. Taking vitamin and mineral supplements as recommended by your health care provider. What happens during an annual well check? The services and screenings done by your health care provider during your annual well check will depend on your age, overall health, lifestyle risk factors, and family history of disease. Counseling  Your health care provider may ask you questions about your: Alcohol use. Tobacco use. Drug use. Emotional well-being. Home and relationship well-being. Sexual activity. Eating habits. History of  falls. Memory and ability to understand (cognition). Work and work Statistician. Screening  You may have the following tests or measurements: Height, weight, and BMI. Blood pressure. Lipid and cholesterol levels. These may be checked every 5 years, or more frequently if you are over 13 years old. Skin check. Lung cancer screening. You may have this screening every year starting at age 37 if you have a 30-pack-year history of smoking and currently smoke or have quit within the past 15 years. Fecal occult blood test (FOBT) of the stool. You may have this test every year starting at age 60. Flexible sigmoidoscopy or colonoscopy. You may have a sigmoidoscopy every 5 years or a colonoscopy every 10 years starting at age 66. Prostate cancer screening. Recommendations will vary depending on your family history and other risks. Hepatitis C blood test. Hepatitis B blood test. Sexually transmitted disease (STD) testing. Diabetes screening. This is done by checking your blood sugar (glucose) after you have not eaten for a while (fasting). You may have this done every 1-3 years. Abdominal aortic aneurysm (AAA) screening. You may need this if you are a current or former smoker. Osteoporosis. You may be screened starting at age 31 if you are at high risk. Talk with your health care provider about your test results, treatment options, and if necessary, the need for more tests. Vaccines  Your health care provider may recommend certain vaccines, such as: Influenza vaccine. This is recommended every year. Tetanus, diphtheria, and acellular pertussis (Tdap, Td) vaccine. You may need a Td booster every 10 years. Zoster vaccine. You may need this after age 22. Pneumococcal 13-valent conjugate (PCV13) vaccine. One dose is recommended after age 27. Pneumococcal polysaccharide (PPSV23) vaccine. One dose is recommended after age 71. Talk to your health care provider  about which screenings and vaccines you need and  how often you need them. This information is not intended to replace advice given to you by your health care provider. Make sure you discuss any questions you have with your health care provider. Document Released: 01/09/2016 Document Revised: 09/01/2016 Document Reviewed: 10/14/2015 Elsevier Interactive Patient Education  2017 [place] Prevention in the Home Falls can cause injuries. They can happen to people of all ages. There are many things you can do to make your home safe and to help prevent falls. What can I do on the outside of my home? Regularly fix the edges of walkways and driveways and fix any cracks. Remove anything that might make you trip as you walk through a door, such as a raised step or threshold. Trim any bushes or trees on the path to your home. Use bright outdoor lighting. Clear any walking paths of anything that might make someone trip, such as rocks or tools. Regularly check to see if handrails are loose or broken. Make sure that both sides of any steps have handrails. Any raised decks and porches should have guardrails on the edges. Have any leaves, snow, or ice cleared regularly. Use sand or salt on walking paths during winter. Clean up any spills in your garage right away. This includes oil or grease spills. What can I do in the bathroom? Use night lights. Install grab bars by the toilet and in the tub and shower. Do not use towel bars as grab bars. Use non-skid mats or decals in the tub or shower. If you need to sit down in the shower, use a plastic, non-slip stool. Keep the floor dry. Clean up any water that spills on the floor as soon as it happens. Remove soap buildup in the tub or shower regularly. Attach bath mats securely with double-sided non-slip rug tape. Do not have throw rugs and other things on the floor that can make you trip. What can I do in the bedroom? Use night lights. Make sure that you have a light by your bed that is easy to  reach. Do not use any sheets or blankets that are too big for your bed. They should not hang down onto the floor. Have a firm chair that has side arms. You can use this for support while you get dressed. Do not have throw rugs and other things on the floor that can make you trip. What can I do in the kitchen? Clean up any spills right away. Avoid walking on wet floors. Keep items that you use a lot in easy-to-reach places. If you need to reach something above you, use a strong step stool that has a grab bar. Keep electrical cords out of the way. Do not use floor polish or wax that makes floors slippery. If you must use wax, use non-skid floor wax. Do not have throw rugs and other things on the floor that can make you trip. What can I do with my stairs? Do not leave any items on the stairs. Make sure that there are handrails on both sides of the stairs and use them. Fix handrails that are broken or loose. Make sure that handrails are as long as the stairways. Check any carpeting to make sure that it is firmly attached to the stairs. Fix any carpet that is loose or worn. Avoid having throw rugs at the top or bottom of the stairs. If you do have throw rugs, attach them to the floor  with carpet tape. Make sure that you have a light switch at the top of the stairs and the bottom of the stairs. If you do not have them, ask someone to add them for you. What else can I do to help prevent falls? Wear shoes that: Do not have high heels. Have rubber bottoms. Are comfortable and fit you well. Are closed at the toe. Do not wear sandals. If you use a stepladder: Make sure that it is fully opened. Do not climb a closed stepladder. Make sure that both sides of the stepladder are locked into place. Ask someone to hold it for you, if possible. Clearly mark and make sure that you can see: Any grab bars or handrails. First and last steps. Where the edge of each step is. Use tools that help you move  around (mobility aids) if they are needed. These include: Canes. Walkers. Scooters. Crutches. Turn on the lights when you go into a dark area. Replace any light bulbs as soon as they burn out. Set up your furniture so you have a clear path. Avoid moving your furniture around. If any of your floors are uneven, fix them. If there are any pets around you, be aware of where they are. Review your medicines with your doctor. Some medicines can make you feel dizzy. This can increase your chance of falling. Ask your doctor what other things that you can do to help prevent falls. This information is not intended to replace advice given to you by your health care provider. Make sure you discuss any questions you have with your health care provider. Document Released: 10/09/2009 Document Revised: 05/20/2016 Document Reviewed: 01/17/2015 Elsevier Interactive Patient Education  2017 [employer].

## 2022-11-22 ENCOUNTER — Encounter: Payer: Self-pay | Admitting: Family Medicine

## 2022-11-22 ENCOUNTER — Other Ambulatory Visit: Payer: Self-pay

## 2022-11-22 ENCOUNTER — Telehealth: Payer: Self-pay | Admitting: Family Medicine

## 2022-11-22 ENCOUNTER — Ambulatory Visit (INDEPENDENT_AMBULATORY_CARE_PROVIDER_SITE_OTHER): Payer: Medicare Other | Admitting: Family Medicine

## 2022-11-22 VITALS — BP 136/80 | HR 46 | Temp 98.5°F | Ht 68.0 in | Wt 126.4 lb

## 2022-11-22 DIAGNOSIS — I119 Hypertensive heart disease without heart failure: Secondary | ICD-10-CM | POA: Diagnosis not present

## 2022-11-22 DIAGNOSIS — I10 Essential (primary) hypertension: Secondary | ICD-10-CM | POA: Diagnosis not present

## 2022-11-22 DIAGNOSIS — Z0000 Encounter for general adult medical examination without abnormal findings: Secondary | ICD-10-CM | POA: Diagnosis not present

## 2022-11-22 DIAGNOSIS — E039 Hypothyroidism, unspecified: Secondary | ICD-10-CM

## 2022-11-22 DIAGNOSIS — C4492 Squamous cell carcinoma of skin, unspecified: Secondary | ICD-10-CM

## 2022-11-22 DIAGNOSIS — Z7901 Long term (current) use of anticoagulants: Secondary | ICD-10-CM

## 2022-11-22 DIAGNOSIS — I4891 Unspecified atrial fibrillation: Secondary | ICD-10-CM

## 2022-11-22 DIAGNOSIS — Z23 Encounter for immunization: Secondary | ICD-10-CM

## 2022-11-22 DIAGNOSIS — E785 Hyperlipidemia, unspecified: Secondary | ICD-10-CM | POA: Diagnosis not present

## 2022-11-22 DIAGNOSIS — Z8679 Personal history of other diseases of the circulatory system: Secondary | ICD-10-CM | POA: Diagnosis not present

## 2022-11-22 MED ORDER — BETAMETHASONE DIPROPIONATE 0.05 % EX OINT: TOPICAL_OINTMENT | Freq: Every day | CUTANEOUS | 5 refills | Status: DC

## 2022-11-22 MED ORDER — AMLODIPINE BESY-BENAZEPRIL HCL 5-10 MG PO CAPS: 2.0000 | ORAL_CAPSULE | Freq: Every day | ORAL | 3 refills | Status: DC

## 2022-11-22 NOTE — Progress Notes (Signed)
Complete physical exam  Patient: Jeffrey Hardin   DOB: 02-16-1934   88 y.o. Male  MRN: [id number]  Subjective:    Chief Complaint  Patient presents with   Annual Exam    CPE already had AWV with [place] no other issues needs refill on betamethasone cream wants regular flu shot not high dose had bad reaction to high dose flu shot.     Jeffrey Hardin is a 88 y.o. male who presents today for a complete physical exam. He reports consuming a general diet. The patient does not participate in regular exercise at present. He generally feels well. He reports sleeping well. He does not have additional problems to discuss today.  He recently saw his oncologist for his skin cancer.  He continues on Lotrel and is having no difficulty with that.  He continues on Synthroid however the last TSH was elevated.  He is supposed to be on Xarelto but had unacceptable side effects on the medication and has not been taking that in quite some time.  He states that it made him feel sick.  He would like a refill on his Diprolene as he uses this on skin conditions.  He does have a history of hyperlipidemia but is not interested in any medications for this.  Otherwise his family and social history as well as health maintenance and immunizations was reviewed   Most recent fall risk assessment:    11/22/2022    9:46 AM  [place] in the past year? 1  Number falls in past yr: 0  Injury with Fall? 0  Risk for fall due to : No Fall Risks  Follow up Falls evaluation completed     Most recent depression screenings:    11/22/2022    9:47 AM 11/05/2022    9:32 AM  PHQ 2/9 Scores  PHQ - 2 Score 0 0  PHQ- 9 Score  0        Patient Care Team: [name], MD as PCP - General (Family Medicine)   Outpatient Medications Prior to Visit  Medication Sig   aspirin EC 81 MG tablet Take 81 mg by mouth daily.   calcium carbonate (TUMS - DOSED IN MG ELEMENTAL CALCIUM) 500 MG chewable tablet Chew 2 tablets  by mouth daily as needed for indigestion or heartburn.   Glucosamine HCl (GLUCOSAMINE PO) Take 1 tablet by mouth daily.   levothyroxine (SYNTHROID) 100 MCG tablet Take 1 tablet (100 mcg total) by mouth daily before breakfast.   Multiple Vitamins-Minerals (MULTIVITAMIN WITH MINERALS) tablet Take 1 tablet by mouth daily.     rivaroxaban (XARELTO) 20 MG TABS tablet Take 1 tablet (20 mg total) by mouth daily with supper.   vitamin E 400 UNIT capsule Take 400 Units by mouth daily.   [DISCONTINUED] betamethasone dipropionate (DIPROLENE) 0.05 % ointment USE AS DIRECTED   [DISCONTINUED] amLODipine-benazepril (LOTREL) 5-10 MG capsule Take 2 capsules by mouth daily.   [DISCONTINUED] oxyCODONE (OXY IR/ROXICODONE) 5 MG immediate release tablet Take 0.5-1 tablets (2.5-5 mg total) by mouth every 6 (six) hours as needed for severe pain. (Patient not taking: Reported on 09/24/2019)   [DISCONTINUED] predniSONE (STERAPRED UNI-PAK 48 TAB) 10 MG (48) TBPK tablet Take as per manufacturer's recommendation (Patient not taking: Reported on 10/14/2020)   Facility-Administered Medications Prior to Visit  Medication Dose Route Frequency Provider   influenza  inactive virus vaccine (FLUZONE/FLUARIX) injection 0.5 mL  0.5 mL Intramuscular Once [name], MD  Review of Systems  All other systems reviewed and are negative.         Objective:  The ASCVD Risk score (Arnett DK, et al., 2019) failed to calculate for the following reasons:   The 2019 ASCVD risk score is only valid for ages 20 to 62    BP 136/80   Pulse (!) 46   Temp 98.5 F (36.9 C)   Ht '5\' 8"'$  (1.727 m)   Wt 126 lb 6.4 oz (57.3 kg)   BMI 19.22 kg/m    Physical Exam  Alert and in no distress. Tympanic membranes and canals are normal. Pharyngeal area is normal. Neck is supple without adenopathy or thyromegaly. Cardiac exam shows a Regular sinus rhythm without murmurs or gallops. Lungs are clear to auscultation.      Assessment & Plan:     Routine general medical examination at a health care facility - Plan: CBC with Differential/Platelet  Essential hypertension - Plan: CBC with Differential/Platelet, Comprehensive metabolic panel, amLODipine-benazepril (LOTREL) 5-10 MG capsule  Hypertensive heart disease without heart failure - Plan: CBC with Differential/Platelet, Comprehensive metabolic panel  Hypothyroidism, unspecified type - Plan: TSH  Squamous cell carcinoma of skin  History of atrial fibrillation  Hyperlipidemia with target LDL less than 100 - Plan: Lipid panel  Need for influenza vaccination - Plan: Flu Vaccine QUAD 6+ mos PF IM (Fluarix Quad PF)  Immunization History  Administered Date(s) Administered   Fluad Quad(high Dose 65+) 09/24/2019, 10/14/2020, 10/29/2021   Influenza Split 09/28/2011, 10/18/2012   Influenza Whole 11/06/1999, 09/23/2008, 09/08/2010   Influenza, High Dose Seasonal PF 11/02/2016, 09/27/2017   Influenza,inj,Quad PF,6+ Mos 09/11/2013   Influenza-Unspecified 10/01/2003, 09/10/2018   PFIZER(Purple Top)SARS-COV-2 Vaccination 01/17/2020, 02/07/2020, 09/04/2020   Pneumococcal Conjugate-13 04/22/2015   Pneumococcal Polysaccharide-23 08/22/2008   Tdap 08/22/2008    Health Maintenance  Topic Date Due   INFLUENZA VACCINE  07/27/2022   Medicare Annual Wellness (AWV)  11/06/2023   Pneumonia Vaccine 76+ Years old  Completed   HPV VACCINES  Aged Out   COVID-19 Vaccine  Discontinued   Zoster Vaccines- Shingrix  Discontinued    I will honor his wish in regard to his Xarelto since he said it made him sick.  He is not interested in a statin drug.  I will see what his TSH is before adjusting his thyroid medication.  Continue on Lotrel. Problem List Items Addressed This Visit     Hypertensive heart disease (Chronic)   Relevant Medications   amLODipine-benazepril (LOTREL) 5-10 MG capsule   Other Relevant Orders   CBC with Differential/Platelet   Comprehensive metabolic panel   Essential  hypertension   Relevant Medications   amLODipine-benazepril (LOTREL) 5-10 MG capsule   Other Relevant Orders   CBC with Differential/Platelet   Comprehensive metabolic panel   History of atrial fibrillation   Hyperlipidemia with target LDL less than 100   Relevant Medications   amLODipine-benazepril (LOTREL) 5-10 MG capsule   Other Relevant Orders   Lipid panel   Hypothyroid   Relevant Orders   TSH   Squamous cell carcinoma of skin   Other Visit Diagnoses     Routine general medical examination at a health care facility    -  Primary   Relevant Orders   CBC with Differential/Platelet   Need for influenza vaccination       Relevant Orders   Flu Vaccine QUAD 6+ mos PF IM (Fluarix Quad PF)      Follow-up 1 year  Jill Alexanders, MD

## 2022-11-22 NOTE — Telephone Encounter (Signed)
Pt called he needs Diprolene rx sent to [facility] does not take his insurance

## 2022-11-23 LAB — COMPREHENSIVE METABOLIC PANEL
ALT: 11 IU/L (ref 0–44)
AST: 17 IU/L (ref 0–40)
Albumin/Globulin Ratio: 1.3 (ref 1.2–2.2)
Albumin: 3.8 g/dL (ref 3.7–4.7)
Alkaline Phosphatase: 61 IU/L (ref 44–121)
BUN/Creatinine Ratio: 23 (ref 10–24)
BUN: 15 mg/dL (ref 8–27)
Bilirubin Total: 0.4 mg/dL (ref 0.0–1.2)
CO2: 23 mmol/L (ref 20–29)
Calcium: 9.6 mg/dL (ref 8.6–10.2)
Chloride: 99 mmol/L (ref 96–106)
Creatinine, Ser: 0.66 mg/dL — ABNORMAL LOW (ref 0.76–1.27)
Globulin, Total: 3 g/dL (ref 1.5–4.5)
Glucose: 100 mg/dL — ABNORMAL HIGH (ref 70–99)
Potassium: 4.3 mmol/L (ref 3.5–5.2)
Sodium: 135 mmol/L (ref 134–144)
Total Protein: 6.8 g/dL (ref 6.0–8.5)
eGFR: 91 mL/min/{1.73_m2} (ref >59)

## 2022-11-23 LAB — CBC WITH DIFFERENTIAL/PLATELET
Basophils Absolute: 0.1 10*3/uL (ref 0.0–0.2)
Basos: 1 %
EOS (ABSOLUTE): 0.1 10*3/uL (ref 0.0–0.4)
Eos: 3 %
Hematocrit: 41.9 % (ref 37.5–51.0)
Hemoglobin: 14.1 g/dL (ref 13.0–17.7)
Immature Grans (Abs): 0 10*3/uL (ref 0.0–0.1)
Immature Granulocytes: 0 %
Lymphocytes Absolute: 0.9 10*3/uL (ref 0.7–3.1)
Lymphs: 17 %
MCH: 31.2 pg (ref 26.6–33.0)
MCHC: 33.7 g/dL (ref 31.5–35.7)
MCV: 93 fL (ref 79–97)
Monocytes Absolute: 0.6 10*3/uL (ref 0.1–0.9)
Monocytes: 10 %
Neutrophils Absolute: 3.8 10*3/uL (ref 1.4–7.0)
Neutrophils: 69 %
Platelets: 406 10*3/uL (ref 150–450)
RBC: 4.52 x10E6/uL (ref 4.14–5.80)
RDW: 12.4 % (ref 11.6–15.4)
WBC: 5.4 10*3/uL (ref 3.4–10.8)

## 2022-11-23 LAB — TSH: TSH: 9.45 u[IU]/mL — ABNORMAL HIGH (ref 0.450–4.500)

## 2022-11-23 LAB — LIPID PANEL
Chol/HDL Ratio: 2.4 ratio (ref 0.0–5.0)
Cholesterol, Total: 161 mg/dL (ref 100–199)
HDL: 67 mg/dL (ref >39)
LDL Chol Calc (NIH): 86 mg/dL (ref 0–99)
Triglycerides: 35 mg/dL (ref 0–149)
VLDL Cholesterol Cal: 8 mg/dL (ref 5–40)

## 2022-11-23 MED ORDER — LEVOTHYROXINE SODIUM 112 MCG PO TABS: 112.0000 ug | ORAL_TABLET | Freq: Every day | ORAL | 3 refills | Status: DC

## 2022-11-23 NOTE — Addendum Note (Signed)
Addended by: [name] on: 11/23/2022 04:53 PM   Modules accepted: Orders

## 2022-11-30 ENCOUNTER — Telehealth: Payer: Self-pay | Admitting: Oncology

## 2022-11-30 NOTE — Telephone Encounter (Signed)
Called per dr. Alen Blew transition. Left voicemail for patient to call us back.

## 2023-01-24 ENCOUNTER — Other Ambulatory Visit: Payer: Medicare Other

## 2023-02-24 ENCOUNTER — Inpatient Hospital Stay: Payer: Medicare Other | Attending: Internal Medicine | Admitting: Internal Medicine

## 2023-02-24 ENCOUNTER — Other Ambulatory Visit: Payer: Self-pay

## 2023-02-24 ENCOUNTER — Ambulatory Visit: Payer: Medicare Other | Admitting: Oncology

## 2023-02-24 VITALS — BP 184/83 | HR 48 | Temp 98.2°F | Wt 127.5 lb

## 2023-02-24 DIAGNOSIS — Z85828 Personal history of other malignant neoplasm of skin: Secondary | ICD-10-CM | POA: Insufficient documentation

## 2023-02-24 DIAGNOSIS — C4492 Squamous cell carcinoma of skin, unspecified: Secondary | ICD-10-CM | POA: Diagnosis not present

## 2023-02-24 DIAGNOSIS — Z9221 Personal history of antineoplastic chemotherapy: Secondary | ICD-10-CM | POA: Diagnosis not present

## 2023-02-24 NOTE — Progress Notes (Signed)
[facility] Telephone:([phone]   [phone]  OFFICE PROGRESS NOTE  [place] [address]  DIAGNOSIS: Squamous cell carcinoma of the skin with axillary and inguinal adenopathy diagnosed in 2018  PRIOR THERAPY: 1) status post excision of her malignant lesion measuring 11 x 10 cm and layered closure completed January 24, 2018 by Dr. Iran Planas.  The final pathology showed invasive squamous cell carcinoma with moderate differentiation measuring 8.8 cm with negative margin. 2) Status post excision of axillary mass completed by Dr. Barry Dienes on March 21, 2019 and the final pathology showed moderate to poorly differentiated squamous cell carcinoma. 3) Status post treatment with Libtayo (Cempilimab) 350 Mg IV every 3 weeks started 06/23/2018 and completed June 2020.  CURRENT THERAPY: Observation.  INTERVAL HISTORY: Jeffrey Hardin 89 y.o. male former patient of Dr. Alen Blew who came to the clinic today to establish care with me after Dr. Alen Blew left the practice.  The patient is feeling fine today with no concerning complaints.  He denied having any current chest pain, shortness of breath, cough or hemoptysis.  He denied having any nausea, vomiting, diarrhea or constipation.  He has no headache or visual changes.  He has no recent weight loss or night sweats.  His last treatment was in June 2020 and the patient has been in observation since that time. The patient is married and has 2 children and several grand and great grandchildren.  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  He has no current history for smoking but drinks alcohol at regular basis and no history of drug abuse. 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  MEDICATIONS:  Current Outpatient Medications  Medication Sig Dispense Refill   amLODipine-benazepril (LOTREL) 5-10 MG capsule Take 2 capsules by mouth daily. 180 capsule 3   aspirin EC 81 MG tablet Take 81 mg by mouth daily.     betamethasone dipropionate (DIPROLENE) 0.05 % ointment Apply topically daily. 45 g 5   calcium carbonate (TUMS - DOSED IN MG ELEMENTAL CALCIUM) 500 MG chewable tablet Chew 2 tablets by mouth daily as needed for indigestion or heartburn.     Glucosamine HCl (GLUCOSAMINE PO) Take 1 tablet by mouth daily.     levothyroxine (SYNTHROID) 112 MCG tablet Take 1 tablet (112 mcg total) by mouth daily. 90 tablet 3   Multiple Vitamins-Minerals (MULTIVITAMIN WITH MINERALS) tablet Take 1 tablet by mouth daily.       vitamin E 400 UNIT capsule Take 400 Units by mouth daily.     No current facility-administered medications for this visit.   Facility-Administered Medications Ordered in Other Visits  Medication Dose Route Frequency Provider Last Rate Last Admin   influenza  inactive virus vaccine (FLUZONE/FLUARIX) injection 0.5 mL  0.5 mL Intramuscular Once [name], MD        SURGICAL HISTORY:  Past Surgical History:  Procedure Laterality  Date   COLONOSCOPY  2007   Dr.Stark   EXCISION MASS UPPER EXTREMETIES Right 03/21/2019   Procedure: EXCISION OF LARGE RIGHT AXILLARY MASS;  Surgeon: Stark Klein, MD;  Location: [facility];  Service: General;  Laterality: Right;   HIP DISARTICULATION Left 1957   sarcoma   MASS EXCISION N/A 01/24/2018   Procedure: EXCISION MALIGNANT  LESION OF ABDOMEN WITH LAYERED CLOSURE;  Surgeon: Irene Limbo, MD;  Location: [facility];  Service: Plastics;  Laterality: N/A;    REVIEW  OF SYSTEMS:  Constitutional: positive for fatigue Eyes: negative Ears, nose, mouth, throat, and face: negative Respiratory: negative Cardiovascular: negative Gastrointestinal: negative Genitourinary:negative Integument/breast: negative Hematologic/lymphatic: negative Musculoskeletal:negative Neurological: negative Behavioral/Psych: negative Endocrine: negative Allergic/Immunologic: negative   PHYSICAL EXAMINATION: General appearance: alert, cooperative, and no distress Head: Normocephalic, without obvious abnormality, atraumatic Neck: no adenopathy, no JVD, supple, symmetrical, trachea midline, and thyroid not enlarged, symmetric, no tenderness/mass/nodules Lymph nodes: Cervical, supraclavicular, and axillary nodes normal. Resp: clear to auscultation bilaterally Back: symmetric, no curvature. ROM normal. No CVA tenderness. Cardio: regular rate and rhythm, S1, S2 normal, no murmur, click, rub or gallop GI: soft, non-tender; bowel sounds normal; no masses,  no organomegaly Extremities: extremities normal, atraumatic, no cyanosis or edema Neurologic: Alert and oriented X 3, normal strength and tone. Normal symmetric reflexes. Normal coordination and gait  ECOG PERFORMANCE STATUS: 1 - Symptomatic but completely ambulatory  Blood pressure (!) 184/83, pulse (!) 48, temperature 98.2 F (36.8 C), temperature source Oral, weight 127 lb 8 oz (57.8 kg), SpO2 99 %.  LABORATORY DATA: Lab Results  Component Value Date   WBC 5.4 11/22/2022   HGB 14.1 11/22/2022   HCT 41.9 11/22/2022   MCV 93 11/22/2022   PLT 406 11/22/2022      Chemistry      Component Value Date/Time   NA 135 11/22/2022 1022   K 4.3 11/22/2022 1022   CL 99 11/22/2022 1022   CO2 23 11/22/2022 1022   BUN 15 11/22/2022 1022   CREATININE 0.66 (L) 11/22/2022 1022   CREATININE 0.70 05/30/2019 1243   CREATININE 0.63 (L) 01/05/2017 0934      Component Value Date/Time   CALCIUM 9.6 11/22/2022 1022   ALKPHOS 61  11/22/2022 1022   AST 17 11/22/2022 1022   AST 17 05/30/2019 1243   ALT 11 11/22/2022 1022   ALT 9 05/30/2019 1243   BILITOT 0.4 11/22/2022 1022   BILITOT 0.4 05/30/2019 1243       RADIOGRAPHIC STUDIES: No results found.  ASSESSMENT AND PLAN: This is a very pleasant 89 years old white male with Squamous cell carcinoma of the skin with axillary and inguinal adenopathy diagnosed in 2018 He is status post the following treatment:  1) status post excision of her malignant lesion measuring 11 x 10 cm and layered closure completed January 24, 2018 by Dr. Iran Planas.  The final pathology showed invasive squamous cell carcinoma with moderate differentiation measuring 8.8 cm with negative margin. 2) Status post excision of axillary mass completed by Dr. Barry Dienes on March 21, 2019 and the final pathology showed moderate to poorly differentiated squamous cell carcinoma. 3) Status post treatment with Libtayo (Cempilimab) 350 Mg IV every 3 weeks started 06/23/2018 and completed June 2020. The patient is currently on observation and he is feeling fine with no concerning complaints. I recommended for him to continue on observation with repeat CBC, comprehensive metabolic panel and LDH in 6 months.  The patient voices understanding of current disease status  and treatment options and is in agreement with the current care plan.  All questions were answered. The patient knows to call the clinic with any problems, questions or concerns. We can certainly see the patient much sooner if necessary. The total time spent in the appointment was 30 minutes.  Disclaimer: This note was dictated with voice recognition software. Similar sounding words can inadvertently be transcribed and may not be corrected upon review.

## 2023-03-11 NOTE — Progress Notes (Signed)
PATIENT DID NOT APPEAR FOR APPOINTMENT

## 2023-05-17 ENCOUNTER — Telehealth: Payer: Self-pay

## 2023-05-17 NOTE — Telephone Encounter (Signed)
This nurse reached out to patient who left a message requesting a call from the nurse.  Patient states that he has been having some difficulty swallowing and is having mucous buildup in his throat.  He states it makes his throat feel tight and he is having "coughing and gagging spells" when the mucous builds up.  He states the tightness does eventually release.  He states that he has been taking Mucinex which helps some.  This nurse also verified if he has had any nasal drainage, chest pain or shortness of breath which he denies all except nasal drainage, also denies fever.  This nurse encouraged patient to reach out to his primary care physician for an appointment advised he may be having some sinus trouble and they possibly may want to do a swallow evaluation since his swallowing has decreased a little.  Patient acknowledged understanding.  No further questions or concerns noted at this time.

## 2023-06-01 ENCOUNTER — Ambulatory Visit (INDEPENDENT_AMBULATORY_CARE_PROVIDER_SITE_OTHER): Payer: Medicare Other | Admitting: Family Medicine

## 2023-06-01 ENCOUNTER — Telehealth: Payer: Self-pay | Admitting: Gastroenterology

## 2023-06-01 ENCOUNTER — Encounter: Payer: Self-pay | Admitting: Family Medicine

## 2023-06-01 VITALS — BP 174/70 | HR 37 | Temp 97.7°F | Resp 16 | Wt 123.8 lb

## 2023-06-01 DIAGNOSIS — R1319 Other dysphagia: Secondary | ICD-10-CM | POA: Diagnosis not present

## 2023-06-01 DIAGNOSIS — E039 Hypothyroidism, unspecified: Secondary | ICD-10-CM | POA: Diagnosis not present

## 2023-06-01 NOTE — Progress Notes (Signed)
   Subjective:    Patient ID: Jeffrey Hardin, male    DOB: 06-10-1934, 89 y.o.   MRN: [id number]  HPI He complains of a 57-month history of difficulty with solid foods getting stuck however liquids still go down easily.  He is also noted difficulty getting his thyroid medication and.  No nausea, vomiting.  He continues to be followed by oncology for his underlying squamous cell cancer of the skin He also is on Synthroid and recent TSH was elevated.  Review of Systems     Objective:   Physical Exam Alert and in no distress. Tympanic membranes and canals are normal. Pharyngeal area is normal. Neck is supple without adenopathy or thyromegaly. Cardiac exam shows a regular sinus rhythm without murmurs or gallops. Lungs are clear to auscultation.        Assessment & Plan:  Esophageal dysphagia - Plan: Ambulatory referral to Gastroenterology  Hypothyroidism, unspecified type - Plan: TSH I explained that I thought this was in a soft stricture and referral to GI was appropriate and the fact that there were probably need to do a little bit of stretching to help with all that.  He was aware of this.

## 2023-06-01 NOTE — Telephone Encounter (Signed)
Urgent referral in [place] for dysphagia.  Solid foods getting stuck in esophagus x2 months.  Last saw Dr. Russella Dar in 2007.  Please review and advise scheduling and urgency.  Thanks

## 2023-06-02 ENCOUNTER — Other Ambulatory Visit: Payer: Self-pay

## 2023-06-02 DIAGNOSIS — E039 Hypothyroidism, unspecified: Secondary | ICD-10-CM

## 2023-06-02 LAB — TSH: TSH: 81.6 u[IU]/mL — ABNORMAL HIGH (ref 0.450–4.500)

## 2023-06-02 NOTE — Telephone Encounter (Signed)
Pt was contacted in regard to previous message. Pt stated that he is progressive having difficulty swallowing and requested soonest appointment .  Pt was scheduled to  see Alcide Evener NP on 06/03/2023 at 3:00 for Dysphagia. Pt made aware.Pt notified to come a little earlier to fill out paper work.  Address provided.  Pt verbalized understanding with all questions answered.

## 2023-06-03 ENCOUNTER — Other Ambulatory Visit: Payer: Self-pay | Admitting: Nurse Practitioner

## 2023-06-03 ENCOUNTER — Telehealth: Payer: Self-pay

## 2023-06-03 ENCOUNTER — Ambulatory Visit (INDEPENDENT_AMBULATORY_CARE_PROVIDER_SITE_OTHER): Payer: Medicare Other | Admitting: Nurse Practitioner

## 2023-06-03 ENCOUNTER — Encounter: Payer: Self-pay | Admitting: Nurse Practitioner

## 2023-06-03 VITALS — BP 110/70 | HR 58 | Ht 68.0 in | Wt 122.0 lb

## 2023-06-03 DIAGNOSIS — R1310 Dysphagia, unspecified: Secondary | ICD-10-CM

## 2023-06-03 MED ORDER — OMEPRAZOLE 20 MG PO CPDR: 20.0000 mg | DELAYED_RELEASE_CAPSULE | Freq: Every day | ORAL | 1 refills | Status: DC

## 2023-06-03 NOTE — Patient Instructions (Signed)
You have been scheduled for a Barium Esophogram at [facility] (1st floor of the hospital) on 6/27 at 11:00 am. Please arrive 15 minutes prior to your appointment for registration. If you need to reschedule for any reason, please contact radiology at [phone] to do so. _________________________________________________________________  Due to recent changes in healthcare laws, you may see the results of your imaging and laboratory studies on MyChart before your provider has had a chance to review them.  We understand that in some cases there may be results that are confusing or concerning to you. Not all laboratory results come back in the same time frame and the provider may be waiting for multiple results in order to interpret others.  Please give [place] 48 hours in order for your provider to thoroughly review all the results before contacting the office for clarification of your results.   Thank you for trusting me with your gastrointestinal care!   Alcide Evener, CRNP

## 2023-06-03 NOTE — Progress Notes (Signed)
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 He presents to our office today as referred by Dr. Sharlot Gowda for further evaluation regarding dysphagia.  He endorses having difficulty swallowing pills and food for the past 2 months.  He describes having food or pills which gets stuck in the upper esophagus/below the suprasternal notch daily.  He swallows a small amount of Metamucil and within a minute or 2 the food passes down the esophagus.  Thinking cold water worsens his dysphagia symptoms.  He sometimes burps during these episodes and the stuck food comes back out.  He has infrequent heartburn for which he takes Tums as needed.  No abdominal pain.  He is passing normal brown bowel movements daily.  No rectal bleeding or black stools.  He is eating soft foods and acknowledges his food intake has decreased.  He is lost 10 pounds over the past few months.  He denies ever having an EGD.  He underwent a colonoscopy by Dr. Russella Dar 08/12/2006 which identified multiple tubular adenomatous polyps removed from the transverse, descending, sigmoid and rectum.  Diverticulosis was noted throughout the colon as well.      Latest Ref Rng & Units 11/22/2022   10:22 AM 10/29/2021   10:49 AM 10/14/2020   11:04 AM  CBC  WBC 3.4 - 10.8 x10E3/uL 5.4  6.6  7.2   Hemoglobin 13.0 - 17.7 g/dL 40.9  81.1  91.4   Hematocrit 37.5 - 51.0 % 41.9  44.8  45.3   Platelets 150 - 450 x10E3/uL 406  436  412        Latest Ref  Rng & Units 11/22/2022   10:22 AM 10/29/2021   10:49 AM 10/14/2020   11:04 AM  CMP  Glucose 70 - 99 mg/dL 782  956  213   BUN 8 - 27 mg/dL 15  11  12    Creatinine 0.76 - 1.27 mg/dL 0.86  5.78  4.69   Sodium 134 - 144 mmol/L 135  136  132   Potassium 3.5 - 5.2 mmol/L 4.3  4.5  4.6   Chloride 96 - 106 mmol/L 99  96  96   CO2 20 - 29 mmol/L 23  23  22    Calcium 8.6 - 10.2 mg/dL 9.6  9.7  9.1   Total Protein 6.0 - 8.5 g/dL 6.8  6.8  6.6   Total Bilirubin 0.0 - 1.2 mg/dL 0.4  0.6  0.6   Alkaline Phos 44 - 121 IU/L 61  65  66   AST 0 - 40 IU/L 17  17  17    ALT 0 - 44 IU/L 11  11  9      Colonoscopy 08/12/2006 by Dr. Russella Dar: Multiple polyps throughout the transverse, descending colon, sigmoid colon and rectum and diverticulosis throughout the colon 1. COLON, BIOPSY, TRANSVERSE AND DESCENDING: FRAGMENTS OF   TUBULAR ADENOMA. NO HIGH GRADE DYSPLASIA OR MALIGNANCY   IDENTIFIED.    2. SIGMOID COLON, BIOPSY: TUBULAR ADENOMA. NO HIGH GRADE   DYSPLASIA OR MALIGNANCY IDENTIFIED.  3. RECTUM, BIOPSY: FRAGMENTS OF TUBULAR ADENOMA. NO HIGH GRADE   DYSPLASIA OR MALIGNANCY IDENTIFIED.   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   Thyroid disease    HYPOTHYROIDISM   Past Surgical History:  Procedure Laterality Date   COLONOSCOPY  2007   Dr.Stark   EXCISION MASS UPPER EXTREMETIES Right 03/21/2019   Procedure: EXCISION OF LARGE RIGHT AXILLARY MASS;  Surgeon: Almond Lint, MD;  Location: [facility];  Service: General;  Laterality: Right;   HIP DISARTICULATION Left 1957   sarcoma   MASS EXCISION N/A 01/24/2018   Procedure: EXCISION  MALIGNANT  LESION OF ABDOMEN WITH LAYERED CLOSURE;  Surgeon: Glenna Fellows, MD;  Location: [facility] OR;  Service: Plastics;  Laterality: N/A;   Social History: He is a retired [employer] cabin crew.  He is married.  He has 1 son and 1 daughter.  Non-smoker.  He drinks 3 light beers daily.  No drug use.  Family History: No known family history of esophageal, gastric or colon cancer.  No Known Allergies    Outpatient Encounter Medications as of 06/03/2023  Medication Sig   amLODipine-benazepril (LOTREL) 5-10 MG capsule Take 2 capsules by mouth daily.   aspirin EC 81 MG tablet Take 81 mg by mouth daily.   betamethasone dipropionate (DIPROLENE) 0.05 % ointment Apply topically daily.   calcium carbonate (TUMS - DOSED IN MG ELEMENTAL CALCIUM) 500 MG chewable tablet Chew 2 tablets by mouth daily as needed for indigestion or heartburn.   Glucosamine HCl (GLUCOSAMINE PO) Take 1 tablet by mouth daily.   levothyroxine (SYNTHROID) 112 MCG tablet Take 1 tablet (112 mcg total) by mouth daily.   Multiple Vitamins-Minerals (MULTIVITAMIN WITH MINERALS) tablet Take 1 tablet by mouth daily.     vitamin E 400 UNIT capsule Take 400 Units by mouth daily.   Facility-Administered Encounter Medications as of 06/03/2023  Medication   influenza  inactive virus vaccine (FLUZONE/FLUARIX) injection 0.5 mL    REVIEW OF SYSTEMS:  Gen: + 10lb weight loss. Denies fever, sweats or chills.  CV: Denies chest pain, palpitations or edema. Resp: Denies cough, shortness of breath of hemoptysis.  GI: Se HPI. GU : Denies urinary burning, blood in urine, increased urinary frequency or incontinence. MS: Ambulates with a left hip/leg bucket prosthesis,  Derm: Denies rash, itchiness, skin lesions or unhealing ulcers. Psych: Denies depression, anxiety, memory loss or confusion. Heme: Denies bruising, easy bleeding. Neuro:  Denies headaches, dizziness or paresthesias. Endo:  Denies any problems with DM, thyroid or adrenal  function.  PHYSICAL EXAM: BP 110/70   Pulse (!) 58   Ht 5\' 8"  (1.727 m)   Wt 122 lb (55.3 kg)   BMI 18.55 kg/m   Wt Readings from Last 3 Encounters:  06/03/23 122 lb (55.3 kg)  06/01/23 123 lb 12.8 oz (56.2 kg)  02/24/23 127 lb 8 oz (57.8 kg)    General: 89 year old male in no acute distress. Head: Normocephalic and atraumatic. Eyes:  Sclerae non-icteric, conjunctive pink. Ears: Normal auditory acuity. Mouth: Dentition intact. No ulcers or lesions.  Neck: Supple, no lymphadenopathy  or thyromegaly.  Lungs: Clear bilaterally to auscultation without wheezes, crackles or rhonchi. Heart: Regular rate and rhythm. No murmur, rub or gallop appreciated.  Abdomen: Soft, nontender, nondistended. No masses. No hepatosplenomegaly. Normoactive bowel sounds x 4 quadrants.  Rectal: Deferred. Musculoskeletal: Wears a left hip/leg bucket prosthesis. Skin: Warm and dry. No rash or lesions on visible extremities. Extremities: No edema. Neurological: Alert oriented x 4, no focal deficits.  Psychological:  Alert and cooperative. Normal mood and affect.  ASSESSMENT AND PLAN:  89 year old male with dysphagia with pills and solid foods for the past 2 months.  Infrequent heartburn. -EGD benefits and risks discussed including risk with sedation, risk of bleeding, perforation and infection.  Patient does not wish to schedule an invasive EGD at this time. -Barium swallow with tablet  -Further recommendations will be determined after barium swallow study results reviewed -Start Omeprazole 20 mg 1 p.o. daily -Patient instructed to eat soft foods, cut food into small pieces, chew food thoroughly -Patient to contact office if symptoms worsen  History of tubular adenomatous colorectal polyps per colonoscopy 2007 -No further colonoscopies due to age      CC:  Ronnald Nian, MD

## 2023-06-06 NOTE — Telephone Encounter (Signed)
Error

## 2023-06-23 ENCOUNTER — Ambulatory Visit (HOSPITAL_COMMUNITY)
Admission: RE | Admit: 2023-06-23 | Discharge: 2023-06-23 | Disposition: A | Discharge status: Home / Self Care | Payer: Medicare Other | Source: Ambulatory Visit | Attending: Nurse Practitioner | Admitting: Nurse Practitioner

## 2023-06-23 ENCOUNTER — Telehealth: Payer: Self-pay

## 2023-06-23 ENCOUNTER — Other Ambulatory Visit: Payer: Self-pay

## 2023-06-23 DIAGNOSIS — R1310 Dysphagia, unspecified: Secondary | ICD-10-CM | POA: Insufficient documentation

## 2023-06-23 NOTE — Telephone Encounter (Signed)
See barium swallow study notes with plan.

## 2023-06-23 NOTE — Telephone Encounter (Signed)
Received call report from radiology, see below:  IMPRESSION: 1. Severe narrowing at the level of the distal esophagus with delayed and intermittent passage of contrast at this site. This may be due to a stricture or an esophageal mass. Endoscopy recommended for further evaluation. 2. Mild esophageal dysmotility. 3. Small to moderate-sized hiatal hernia.

## 2023-06-29 ENCOUNTER — Encounter: Payer: Self-pay | Admitting: Gastroenterology

## 2023-06-29 ENCOUNTER — Ambulatory Visit: Payer: Medicare Other | Admitting: Gastroenterology

## 2023-06-29 ENCOUNTER — Other Ambulatory Visit: Payer: Self-pay | Admitting: Gastroenterology

## 2023-06-29 VITALS — BP 134/73 | HR 51 | Temp 98.7°F | Resp 18 | Ht 68.0 in | Wt 122.0 lb

## 2023-06-29 DIAGNOSIS — R1310 Dysphagia, unspecified: Secondary | ICD-10-CM | POA: Diagnosis not present

## 2023-06-29 DIAGNOSIS — K222 Esophageal obstruction: Secondary | ICD-10-CM | POA: Diagnosis not present

## 2023-06-29 DIAGNOSIS — I10 Essential (primary) hypertension: Secondary | ICD-10-CM | POA: Diagnosis not present

## 2023-06-29 DIAGNOSIS — K209 Esophagitis, unspecified without bleeding: Secondary | ICD-10-CM | POA: Diagnosis not present

## 2023-06-29 DIAGNOSIS — I4891 Unspecified atrial fibrillation: Secondary | ICD-10-CM | POA: Diagnosis not present

## 2023-06-29 DIAGNOSIS — K2210 Ulcer of esophagus without bleeding: Secondary | ICD-10-CM | POA: Diagnosis not present

## 2023-06-29 MED ORDER — SODIUM CHLORIDE 0.9 % IV SOLN
500.0000 mL | INTRAVENOUS | Status: DC
Start: 2023-06-29 — End: 2023-06-29

## 2023-06-29 MED ORDER — OMEPRAZOLE 20 MG PO CPDR
20.0000 mg | DELAYED_RELEASE_CAPSULE | Freq: Two times a day (BID) | ORAL | 0 refills | Status: DC
Start: 2023-06-29 — End: 2023-06-29

## 2023-06-29 NOTE — Progress Notes (Signed)
History and Physical Interval Note:  06/29/2023 10:41 AM  Jeffrey Hardin  has presented today for endoscopic procedure(s), with the diagnosis of  Encounter Diagnosis  Name Primary?   Dysphagia, unspecified type Yes  .  The various methods of evaluation and treatment have been discussed with the patient and/or family. After consideration of risks, benefits and other options for treatment, the patient has consented to  the endoscopic procedure(s).   The patient's history has been reviewed, patient examined, no change in status, stable for endoscopic procedure(s).  I have reviewed the patient's chart and labs.  Questions were answered to the patient's satisfaction.    Barium swallow on June 27th showed significant luminal narrowing in the distal esophagus, likely several centimeters in length.  Nira Visscher E. Tomasa Rand, MD [facility]

## 2023-06-29 NOTE — Progress Notes (Signed)
Report to PACU, RN, vss, BBS= Clear.  

## 2023-06-29 NOTE — Patient Instructions (Signed)
Repeat upper endoscopy with dilation in the hospital setting at appointment to be scheduled by office   -increase omeprazole to twice a day  YOU HAD AN ENDOSCOPIC PROCEDURE TODAY AT THE [facility]:   Refer to the procedure report that was given to you for any specific questions about what was found during the examination.  If the procedure report does not answer your questions, please call your gastroenterologist to clarify.  If you requested that your care partner not be given the details of your procedure findings, then the procedure report has been included in a sealed envelope for you to review at your convenience later.  YOU SHOULD EXPECT: Some feelings of bloating in the abdomen. Passage of more gas than usual.  Walking can help get rid of the air that was put into your GI tract during the procedure and reduce the bloating. If you had a lower endoscopy (such as a colonoscopy or flexible sigmoidoscopy) you may notice spotting of blood in your stool or on the toilet paper. If you underwent a bowel prep for your procedure, you may not have a normal bowel movement for a few days.  Please Note:  You might notice some irritation and congestion in your nose or some drainage.  This is from the oxygen used during your procedure.  There is no need for concern and it should clear up in a day or so.  SYMPTOMS TO REPORT IMMEDIATELY:  Following upper endoscopy (EGD)  Vomiting of blood or coffee ground material  New chest pain or pain under the shoulder blades  Painful or persistently difficult swallowing  New shortness of breath  Fever of 100F or higher  Black, tarry-looking stools  For urgent or emergent issues, a gastroenterologist can be reached at any hour by calling [phone]. Do not use MyChart messaging for urgent concerns.    DIET:  We do recommend a small meal at first, but then you may proceed to your regular diet.  Drink plenty of fluids but you should avoid alcoholic  beverages for 24 hours.  ACTIVITY:  You should plan to take it easy for the rest of today and you should NOT DRIVE or use heavy machinery until tomorrow (because of the sedation medicines used during the test).    FOLLOW UP: Our staff will call the number listed on your records the next business day following your procedure.  We will call around 7:15- 8:00 am to check on you and address any questions or concerns that you may have regarding the information given to you following your procedure. If we do not reach you, we will leave a message.     If any biopsies were taken you will be contacted by phone or by letter within the next 1-3 weeks.  Please call us at [phone] if you have not heard about the biopsies in 3 weeks.    SIGNATURES/CONFIDENTIALITY: You and/or your care partner have signed paperwork which will be entered into your electronic medical record.  These signatures attest to the fact that that the information above on your After Visit Summary has been reviewed and is understood.  Full responsibility of the confidentiality of this discharge information lies with you and/or your care-partner. Marland Kitchen

## 2023-06-29 NOTE — Op Note (Signed)
[facility] Patient Name: Jeffrey Hardin Procedure Date: 06/29/2023 10:43 AM MRN: [id number] Endoscopist: Lorin Picket E. Tomasa Rand , MD, [id number] Age: 89 Referring MD:  Date of Birth: [birth year] Gender: Male Account #: [id number] Procedure:                Upper GI endoscopy Indications:              Dysphagia, Abnormal UGI series Medicines:                Monitored Anesthesia Care Procedure:                Pre-Anesthesia Assessment:                           - Prior to the procedure, a History and Physical                            was performed, and patient medications and                            allergies were reviewed. The patient's tolerance of                            previous anesthesia was also reviewed. The risks                            and benefits of the procedure and the sedation                            options and risks were discussed with the patient.                            All questions were answered, and informed consent                            was obtained. Prior Anticoagulants: The patient has                            taken no anticoagulant or antiplatelet agents. ASA                            Grade Assessment: II - A patient with mild systemic                            disease. After reviewing the risks and benefits,                            the patient was deemed in satisfactory condition to                            undergo the procedure.                           After obtaining informed consent, the endoscope was  passed under direct vision. Throughout the                            procedure, the patient's blood pressure, pulse, and                            oxygen saturations were monitored continuously. The                            GIF [id number] #[id number] was introduced through the                            mouth, and advanced to the middle third of                            esophagus. The upper GI  endoscopy was accomplished                            without difficulty. The patient tolerated the                            procedure well. Scope In: Scope Out: Findings:                 The examined portions of the nasopharynx,                            oropharynx and larynx were normal.                           One benign-appearing, intrinsic severe stenosis was                            found 32 cm from the incisors. This stenosis                            measured 2-3 mm (inner diameter) and was                            characterized by ulceration. The stenosis was not                            traversed. Biopsies were taken with a cold forceps                            for histology. Estimated blood loss was minimal.                           LA Grade D (one or more mucosal breaks involving at                            least 75% of esophageal circumference) esophagitis                            was found  proximal to the stenosis, 32 to 35 cm                            from the incisors. Biopsies were taken with a cold                            forceps for histology. Estimated blood loss was                            minimal. There was a small diverticulum, presumably                            pulsion, present proximal to the stenosis. Complications:            No immediate complications. Estimated Blood Loss:     Estimated blood loss was minimal. Impression:               - The examined portions of the nasopharynx,                            oropharynx and larynx were normal.                           - Severe esophageal stenosis. Biopsied. Likely                            benign, but malignancy possible.                           - LA Grade D esophagitis. Biopsied. Likely stasis                            esophagitis. Recommendation:           - Patient has a contact number available for                            emergencies. The signs and symptoms of potential                             delayed complications were discussed with the                            patient. Return to normal activities tomorrow.                            Written discharge instructions were provided to the                            patient.                           - Recommend soft diet.                           - Continue present medications.                           -  Increase omeprazole to twice daily.                           - Await pathology results.                           - Repeat upper endoscopy with dilation in the                            hospital setting at appointment to be scheduled. Joevon Holliman E. Tomasa Rand, MD 06/29/2023 11:13:17 AM This report has been signed electronically.

## 2023-06-29 NOTE — Progress Notes (Signed)
Called to room to assist during endoscopic procedure.  Patient ID and intended procedure confirmed with present staff. Received instructions for my participation in the procedure from the performing physician.  

## 2023-07-01 ENCOUNTER — Telehealth: Payer: Self-pay

## 2023-07-01 NOTE — Telephone Encounter (Signed)
  Follow up Call-     06/29/2023   10:23 AM  Call back number  Post procedure Call Back phone  # [phone]  Permission to leave phone message Yes     Patient questions:  Do you have a fever, pain , or abdominal swelling? No. Pain Score  0 *  Have you tolerated food without any problems? Yes.    Have you been able to return to your normal activities? Yes.    Do you have any questions about your discharge instructions: Diet   No. Medications  No. Follow up visit  No.  Do you have questions or concerns about your Care? No.  Actions: * If pain score is 4 or above: No action needed, pain <4.

## 2023-07-06 ENCOUNTER — Telehealth: Payer: Self-pay | Admitting: Gastroenterology

## 2023-07-06 ENCOUNTER — Other Ambulatory Visit: Payer: Self-pay

## 2023-07-06 DIAGNOSIS — K222 Esophageal obstruction: Secondary | ICD-10-CM

## 2023-07-06 DIAGNOSIS — R1310 Dysphagia, unspecified: Secondary | ICD-10-CM

## 2023-07-06 NOTE — Telephone Encounter (Signed)
Jeffrey Hardin this repeat procedure was on the procedure report.

## 2023-07-06 NOTE — Telephone Encounter (Signed)
Contacted patient and he is scheduled for 08/02/23 @ 10:45am. Patient stated that if there is a chance he can have the procedure sooner he is okay having another provider.

## 2023-07-06 NOTE — Telephone Encounter (Signed)
Spoke with pt and let him know he is on the list for the hospital procedure. Pt states he needs this asap because he is choking 2-3 times/day. Wanted to know if there was any provider that could do the procedure sooner. Let him know I would send a note to Dr. Tomasa Rand and see what he said. Please advise.

## 2023-07-06 NOTE — Telephone Encounter (Signed)
Inbound call from patient stating he came in on 7/3 for a endoscopy but it was not able to be preformed. Stated he was suppose to have a call back regarding scheduling procedure at [facility] but has not heard anything. States he chokes 2-3 times a day and would like an appointment as soon as possible. Requesting a call back. Please advise, thank you.

## 2023-07-07 ENCOUNTER — Encounter: Payer: Self-pay | Admitting: Gastroenterology

## 2023-07-20 NOTE — Telephone Encounter (Signed)
Called patient and informed him that we did not have a sooner hospital date and that if he could not maintain hydration or nutrition needs that he should got to the ED for possible admission.

## 2023-07-26 ENCOUNTER — Telehealth: Payer: Self-pay

## 2023-07-26 ENCOUNTER — Encounter (HOSPITAL_COMMUNITY): Payer: Self-pay | Admitting: Gastroenterology

## 2023-07-26 NOTE — Telephone Encounter (Signed)
I called patient and scheduled an appointment with Dr. Susann Givens for Thursday, 07/28/2023 at 3:00pm. Patient was reluctant to schedule an appointment. I explained to him the reason for the appointment, but patient feels that its excessive and unnecessary. Patient agreed to keep the appointment.

## 2023-07-26 NOTE — Progress Notes (Signed)
Jeffrey Hardin  Prep instructions- n/a  [name] MD Cardiologist- doesnt see one  EKG- 10/29/21 Echo-n/a Cath-n/a Stress- n/a ICD/PM-n/a Blood thinner- n/a GLP-1- n/a  Hx: HTN, Afib, Squamous cell carcinoma of the skin, Esophageal stenosis/dysphagia. Patient sees PCP yearly and in 2022 noted a murmur, referred to cardiology, never went. His last few notes from various providers do not note a murmur. I asked the pt and he said he didnt need to see them, his heart was good.  Anesthesia Review: Yes

## 2023-07-26 NOTE — Telephone Encounter (Signed)
   Jeffrey Hardin [facility] 08/31/1934 [id number]  Dear Dr. Susann Givens:  We have scheduled the above named patient for a(n) Endoscopic procedure at the hospital on 08/02/23. Anesthesia saw in his chart where it mentioned a heart murmur and the pt was to followup with cardiology but never went to be seen. Subsequent notes do not mention a murmur.   Anesthesia is requesting PCP clearance for the Endoscopy.  Please advise as to whether the patient may proceed with his EGD as scheduled.  Please route your response to Selinda Michaels RN, or fax clearance to [phone].  Sincerely, [facility]    [facility]

## 2023-07-27 ENCOUNTER — Other Ambulatory Visit: Payer: Self-pay

## 2023-07-27 DIAGNOSIS — K222 Esophageal obstruction: Secondary | ICD-10-CM

## 2023-07-27 DIAGNOSIS — R1310 Dysphagia, unspecified: Secondary | ICD-10-CM

## 2023-07-28 ENCOUNTER — Telehealth: Payer: Self-pay

## 2023-07-28 ENCOUNTER — Ambulatory Visit: Payer: Medicare Other | Admitting: Family Medicine

## 2023-07-28 ENCOUNTER — Other Ambulatory Visit (INDEPENDENT_AMBULATORY_CARE_PROVIDER_SITE_OTHER): Payer: Medicare Other

## 2023-07-28 ENCOUNTER — Encounter: Payer: Self-pay | Admitting: Family Medicine

## 2023-07-28 VITALS — BP 124/72 | HR 53 | Temp 98.3°F | Wt 116.8 lb

## 2023-07-28 DIAGNOSIS — Z8679 Personal history of other diseases of the circulatory system: Secondary | ICD-10-CM | POA: Diagnosis not present

## 2023-07-28 DIAGNOSIS — R001 Bradycardia, unspecified: Secondary | ICD-10-CM | POA: Diagnosis not present

## 2023-07-28 NOTE — Telephone Encounter (Signed)
Justice Britain, CMA  Jenel Lucks, MD; Chrystie Nose, RN      Previous Messages    ----- Message ----- From: Ronnald Nian, MD Sent: 07/28/2023   3:19 PM EDT To: Justice Britain, CMA  I saw the [facility] today concerning his potential for murmur.  Clinically he is having no cardiac symptoms and does have a previous history of bradycardia.  I discussed referral to cardiology but he is not interested in doing that and is willing to accept any risk from the procedure.  If he is willing to accept that risk I have no reason to deny him.  He wants to get the study done Sharlot Gowda

## 2023-07-28 NOTE — Progress Notes (Signed)
   Subjective:    Patient ID: Jeffrey Hardin, male    DOB: [birth year], 89 y.o.   MRN: [id number]  HPI He is here for clearance for endoscopy for further evaluation of weight loss.  There is remote history of murmur and he also had atrial fibrillation.  He stopped taking his DOAC due to intolerance to the medicine.  He was scheduled for follow-up visit with cardiology in 2022 but did not keep the appointment.  Review of the record indicates he has had bradycardia for quite some time.  He is having no weakness, shortness of breath, palpitations.   Review of Systems     Objective:    Physical Exam Alert and in no distress.  Cardiac exam does show a bradycardia but no murmur was appreciated.  EKG showed a ventricular late of 42 with a junctional bradycardia.  Appears to be atrial fibs.     Assessment & Plan:  Bradycardia  History of atrial fibrillation I discussed this with him in detail and my recommendation would be to get cardiology to sign off however he is not interested in delaying this any further and wants to get the EGD done.  He will except any cardiac risk recognizing the potential for death.

## 2023-08-02 ENCOUNTER — Ambulatory Visit (HOSPITAL_COMMUNITY): Payer: Self-pay | Admitting: Certified Registered Nurse Anesthetist

## 2023-08-02 ENCOUNTER — Ambulatory Visit (HOSPITAL_BASED_OUTPATIENT_CLINIC_OR_DEPARTMENT_OTHER): Payer: Medicare Other | Admitting: Certified Registered Nurse Anesthetist

## 2023-08-02 ENCOUNTER — Ambulatory Visit (HOSPITAL_COMMUNITY)
Admission: RE | Admit: 2023-08-02 | Discharge: 2023-08-02 | Disposition: A | Discharge status: Home / Self Care | Payer: Medicare Other | Attending: Gastroenterology | Admitting: Gastroenterology

## 2023-08-02 ENCOUNTER — Encounter (HOSPITAL_COMMUNITY): Payer: Self-pay | Admitting: Gastroenterology

## 2023-08-02 ENCOUNTER — Other Ambulatory Visit: Payer: Self-pay

## 2023-08-02 ENCOUNTER — Encounter (HOSPITAL_COMMUNITY)
Admission: RE | Disposition: A | Discharge status: Home / Self Care | Payer: Self-pay | Source: Home / Self Care | Attending: Gastroenterology

## 2023-08-02 DIAGNOSIS — K222 Esophageal obstruction: Secondary | ICD-10-CM | POA: Diagnosis not present

## 2023-08-02 DIAGNOSIS — E039 Hypothyroidism, unspecified: Secondary | ICD-10-CM | POA: Diagnosis not present

## 2023-08-02 DIAGNOSIS — I110 Hypertensive heart disease with heart failure: Secondary | ICD-10-CM

## 2023-08-02 DIAGNOSIS — K449 Diaphragmatic hernia without obstruction or gangrene: Secondary | ICD-10-CM | POA: Diagnosis not present

## 2023-08-02 DIAGNOSIS — I4891 Unspecified atrial fibrillation: Secondary | ICD-10-CM | POA: Insufficient documentation

## 2023-08-02 DIAGNOSIS — K225 Diverticulum of esophagus, acquired: Secondary | ICD-10-CM | POA: Diagnosis not present

## 2023-08-02 DIAGNOSIS — I10 Essential (primary) hypertension: Secondary | ICD-10-CM | POA: Diagnosis not present

## 2023-08-02 DIAGNOSIS — R1310 Dysphagia, unspecified: Secondary | ICD-10-CM

## 2023-08-02 DIAGNOSIS — E785 Hyperlipidemia, unspecified: Secondary | ICD-10-CM | POA: Diagnosis not present

## 2023-08-02 HISTORY — PX: ESOPHAGOGASTRODUODENOSCOPY (EGD) WITH PROPOFOL: SHX5813

## 2023-08-02 HISTORY — PX: BALLOON DILATION: SHX5330

## 2023-08-02 SURGERY — ESOPHAGOGASTRODUODENOSCOPY (EGD) WITH PROPOFOL
Anesthesia: Monitor Anesthesia Care

## 2023-08-02 MED ORDER — SODIUM CHLORIDE 0.9 % IV SOLN: INTRAVENOUS | Status: DC

## 2023-08-02 MED ORDER — GLYCOPYRROLATE 0.2 MG/ML IJ SOLN
INTRAMUSCULAR | Status: DC | PRN
  Administered 2023-08-02: .2 mg via INTRAVENOUS

## 2023-08-02 MED ORDER — LACTATED RINGERS IV SOLN: INTRAVENOUS | Status: DC | PRN

## 2023-08-02 MED ORDER — PROPOFOL 10 MG/ML IV BOLUS
INTRAVENOUS | Status: DC | PRN
Start: 2023-08-02 — End: 2023-08-02
  Administered 2023-08-02 (×2): 20 mg via INTRAVENOUS

## 2023-08-02 MED ORDER — PROPOFOL 500 MG/50ML IV EMUL
INTRAVENOUS | Status: DC | PRN
  Administered 2023-08-02: 150 ug/kg/min via INTRAVENOUS

## 2023-08-02 MED ORDER — PROPOFOL 500 MG/50ML IV EMUL
INTRAVENOUS | Status: AC
  Filled 2023-08-02: qty 50

## 2023-08-02 MED ORDER — LIDOCAINE 2% (20 MG/ML) 5 ML SYRINGE
INTRAMUSCULAR | Status: DC | PRN
  Administered 2023-08-02: 40 mg via INTRAVENOUS

## 2023-08-02 SURGICAL SUPPLY — 15 items

## 2023-08-02 NOTE — Op Note (Signed)
[facility] Patient Name: Jeffrey Hardin Procedure Date: 08/02/2023 MRN: [id number] Attending MD: Dub Amis. Tomasa Rand , MD, [id number] Date of Birth: May 19, 1934 CSN: [id number] Age: 89 Admit Type: Outpatient Procedure:                Upper GI endoscopy Indications:              Therapeutic procedure, For therapy of esophageal                            stricture; patient noted to have severe benign                            inflammatory stricture on EGD in July. He has been                            tolerating solid food, as long as he eats very                            slowly and carefully. Providers:                Dub Amis. Tomasa Rand, MD, Carlena Hurl RN, RN,                            Rozetta Nunnery, Technician Referring MD:              Medicines:                Monitored Anesthesia Care Complications:            No immediate complications. Estimated Blood Loss:     Estimated blood loss was minimal. Procedure:                Pre-Anesthesia Assessment:                           - Prior to the procedure, a History and Physical                            was performed, and patient medications and                            allergies were reviewed. The patient's tolerance of                            previous anesthesia was also reviewed. The risks                            and benefits of the procedure and the sedation                            options and risks were discussed with the patient.                            All questions were answered, and informed consent  was obtained. Prior Anticoagulants: The patient has                            taken no anticoagulant or antiplatelet agents                            except for aspirin. ASA Grade Assessment: III - A                            patient with severe systemic disease. After                            reviewing the risks and benefits, the patient was                             deemed in satisfactory condition to undergo the                            procedure.                           After obtaining informed consent, the endoscope was                            passed under direct vision. Throughout the                            procedure, the patient's blood pressure, pulse, and                            oxygen saturations were monitored continuously. The                            [id number]-[id number] ([id number]) Olympus slim endoscope was                            introduced through the mouth, and advanced to the                            second part of duodenum. The [id number]-[id number] ([id number])                            Olympus endoscope was introduced through the and                            advanced to the distal esophagus, but could not be                            advanced past the stricture, even after dilation to                            8 mm. The upper GI endoscopy was accomplished  without difficulty. The patient tolerated the                            procedure well. Scope In: Scope Out: Findings:      The examined portions of the nasopharynx, oropharynx and larynx were       normal.      One benign-appearing, intrinsic severe stenosis was found 34 cm from the       incisors. This stenosis measured 4 mm (inner diameter) x 1 cm (in       length). The stenosis was not able to be traversed even with the       ultraslim endoscope. As the previous barium swallow had shown the       stenosis to be short in lenght, the standard endoscope was then used to       carefully insert a balloon a few centimeters past the stricture.       Dilation was initially performed to 6 mm (for 30 seconds) yielding a       small post dilation effect. A second dilation to 7 mm yielded a slightly       larger post dilation effect. The ultraslim endoscope was then       re-inserted and was able to traverse the stenosis with minimal        resistance. This confirmed the short length of the stricture of about 1       cm. The remainder of the upper GI tract was examined with the ultraslim       scope, which was then removed. The standard scope was then re-inserted       and the stricture was dilated to 8 mm. The dilation site was examined       and showed moderate improvement in luminal narrowing, but was still not       traversable with the standard endoscope. Estimated blood loss was       minimal. There was evidence of stasis esophagitis and a small pulsion       diverticulum proximal to the stricture.      A 6 cm hiatal hernia was present.      The entire examined stomach was normal.      The examined duodenum was normal. Impression:               - The examined portions of the nasopharynx,                            oropharynx and larynx were normal.                           - Benign-appearing esophageal stenosis. Dilated.                           - 6 cm hiatal hernia.                           - Normal stomach.                           - Normal examined duodenum.                           -  No specimens collected. Moderate Sedation:      Not Applicable - Patient had care per Anesthesia. Recommendation:           - Patient has a contact number available for                            emergencies. The signs and symptoms of potential                            delayed complications were discussed with the                            patient. Return to normal activities tomorrow.                            Written discharge instructions were provided to the                            patient.                           - Recommend full liquid diet today, then resume                            previous diet tomorrow.                           - Continue present medications including twice                            daily omeprazole.                           - Repeat upper endoscopy in 3-4 weeks for                             retreatment. Procedure Code(s):        --- Professional ---                           [phone], Esophagogastroduodenoscopy, flexible,                            transoral; with transendoscopic balloon dilation of                            esophagus (less than 30 mm diameter) Diagnosis Code(s):        --- Professional ---                           K22.2, Esophageal obstruction                           K44.9, Diaphragmatic hernia without obstruction or                            gangrene CPT copyright 2022 American Medical  Association. All rights reserved. The codes documented in this report are preliminary and upon coder review may  be revised to meet current compliance requirements.  E. Tomasa Rand, MD 08/02/2023 11:27:16 AM This report has been signed electronically. Number of Addenda: 0

## 2023-08-02 NOTE — H&P (Signed)
[facility] History and Physical   Primary Care Physician:  Ronnald Nian, MD   Reason for Procedure:   Dilation of esophageal stricture  Plan:    EGD with dilation     HPI: Jeffrey Hardin is a 89 y.o. male undergoing EGD with dilation of known esophageal stricture.  EGD last month revealed severe esophageal stricture, unable to traverse with standard endoscope.  Biopsies revealed no evidence of malignancy.  Presumed to be peptic stricture from acid reflux. He has lost about 10-15lbs in the past 3 months but is able to tolerated solid food as long as he eats very slowly.   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   Thyroid disease    HYPOTHYROIDISM    Past Surgical History:  Procedure Laterality Date   COLONOSCOPY  2007   Dr.Stark   EXCISION MASS UPPER EXTREMETIES Right 03/21/2019   Procedure: EXCISION OF LARGE RIGHT AXILLARY MASS;  Surgeon: Almond Lint, MD;  Location: [facility];  Service: General;  Laterality: Right;   HIP DISARTICULATION Left 1957   sarcoma   MASS EXCISION N/A 01/24/2018   Procedure: EXCISION MALIGNANT  LESION OF ABDOMEN WITH LAYERED CLOSURE;  Surgeon: Glenna Fellows, MD;  Location: [facility] OR;  Service: Plastics;  Laterality: N/A;    Prior to Admission medications   Medication Sig Start Date End Date Taking? Authorizing Provider  amLODipine-benazepril (LOTREL) 5-10 MG capsule Take 2 capsules by mouth daily. 11/22/22  Yes Ronnald Nian, MD  aspirin EC 81 MG tablet Take 81 mg by mouth daily.   Yes [provider]  betamethasone dipropionate (DIPROLENE) 0.05 % ointment Apply  topically daily. 11/22/22  Yes Ronnald Nian, MD  calcium carbonate (TUMS - DOSED IN MG ELEMENTAL CALCIUM) 500 MG chewable tablet Chew 2 tablets by mouth daily as needed for indigestion or heartburn.   Yes [provider]  Glucosamine HCl (GLUCOSAMINE PO) Take 1 tablet by mouth daily.   Yes [provider]  levothyroxine (SYNTHROID) 112 MCG tablet Take 1 tablet (112 mcg total) by mouth daily. 11/23/22  Yes Ronnald Nian, MD  Multiple Vitamins-Minerals (MULTIVITAMIN WITH MINERALS) tablet Take 1 tablet by mouth daily.     Yes [provider]  omeprazole (PRILOSEC) 20 MG capsule TAKE 1 CAPSULE(20 MG) BY MOUTH TWICE DAILY BEFORE A MEAL 06/29/23  Yes Jenel Lucks, MD  vitamin E 400 UNIT capsule Take 400 Units by mouth daily.   Yes [provider]    Current Facility-Administered Medications  Medication Dose Route Frequency Provider Last Rate Last Admin   0.9 %  sodium chloride infusion   Intravenous Continuous Jenel Lucks, MD       Facility-Administered Medications Ordered in Other Encounters  Medication Dose Route Frequency Provider Last Rate Last Admin   influenza  inactive virus vaccine (FLUZONE/FLUARIX) injection 0.5 mL  0.5 mL Intramuscular Once Ronnald Nian, MD       lactated ringers infusion   Intravenous Continuous PRN Vanessa [place], CRNA   New Bag at 08/02/23 1011    Allergies  as of 07/06/2023   (No Known Allergies)    Family History  Problem Relation Age of Onset   Cancer Sister     Social History   Socioeconomic History   Marital status: Married    Spouse name: Not on file   Number of children: Not on file   Years of education: Not on file   Highest education level: Not on file  Occupational History   Not on file  Tobacco Use   Smoking status: Never   Smokeless tobacco: Never  Vaping Use   Vaping status: Never Used  Substance and Sexual Activity   Alcohol use: Yes    Alcohol/week: 20.0 standard drinks  of alcohol    Types: 20 Cans of beer per week    Comment: 3 cans a day   Drug use: No   Sexual activity: Not Currently  Other Topics Concern   Not on file  Social History Narrative   Not on file   Social Determinants of Health   Financial Resource Strain: Low Risk  (11/05/2022)   Overall Financial Resource Strain (CARDIA)    Difficulty of Paying Living Expenses: Not hard at all  Food Insecurity: No Food Insecurity (11/05/2022)   Hunger Vital Sign    Worried About Running Out of Food in the Last Year: Never true    Ran Out of Food in the Last Year: Never true  Transportation Needs: No Transportation Needs (11/05/2022)   PRAPARE - Administrator, Civil Service (Medical): No    Lack of Transportation (Non-Medical): No  Physical Activity: Inactive (11/05/2022)   Exercise Vital Sign    Days of Exercise per Week: 0 days    Minutes of Exercise per Session: 0 min  Stress: No Stress Concern Present (11/05/2022)   [employer] of Occupational Health - Occupational Stress Questionnaire    Feeling of Stress : Not at all  Social Connections: Not on file  Intimate Partner Violence: Not on file    Review of Systems:  All other review of systems negative except as mentioned in the HPI.  Physical Exam: Vital signs BP (!) 193/53   Pulse (!) 40   Temp (!) 97.5 F (36.4 C) (Temporal)   Resp 16   Ht 5\' 8"  (1.727 m)   Wt 56.7 kg   SpO2 100%   BMI 19.01 kg/m   General:   Alert,  Well-developed, well-nourished, pleasant and cooperative in NAD Airway:  Mallampati 2 Lungs:  Clear throughout to auscultation.   Heart:  Bradycardic; no murmurs, clicks, rubs,  or gallops. Abdomen:  Soft, nontender and nondistended. Normal bowel sounds.   Neuro/Psych:  Normal mood and affect. A and O x 3    E. Tomasa Rand, MD [facility]

## 2023-08-02 NOTE — Anesthesia Preprocedure Evaluation (Addendum)
Anesthesia Evaluation  Patient identified by MRN, date of birth, ID band Patient awake    Reviewed: Allergy & Precautions, NPO status , Patient's Chart, lab work & pertinent test results  Airway Mallampati: I  TM Distance: >3 FB Neck ROM: Full    Dental  (+) Missing, Dental Advisory Given, Chipped,    Pulmonary neg pulmonary ROS   Pulmonary exam normal breath sounds clear to auscultation       Cardiovascular hypertension, Pt. on medications Normal cardiovascular exam+ dysrhythmias Atrial Fibrillation  Rhythm:Regular Rate:Normal     Neuro/Psych negative neurological ROS  negative psych ROS   GI/Hepatic negative GI ROS,,,(+) Hepatitis -, B  Endo/Other  Hypothyroidism    Renal/GU negative Renal ROS  negative genitourinary   Musculoskeletal negative musculoskeletal ROS (+)    Abdominal   Peds  Hematology negative hematology ROS (+)   Anesthesia Other Findings   Reproductive/Obstetrics                             Anesthesia Physical Anesthesia Plan  ASA: 3  Anesthesia Plan: MAC   Post-op Pain Management:    Induction: Intravenous  PONV Risk Score and Plan: Propofol infusion and Treatment may vary due to age or medical condition  Airway Management Planned: Natural Airway  Additional Equipment:   Intra-op Plan:   Post-operative Plan:   Informed Consent: I have reviewed the patients History and Physical, chart, labs and discussed the procedure including the risks, benefits and alternatives for the proposed anesthesia with the patient or authorized representative who has indicated his/her understanding and acceptance.     Dental advisory given  Plan Discussed with: CRNA  Anesthesia Plan Comments:        Anesthesia Quick Evaluation

## 2023-08-02 NOTE — Anesthesia Postprocedure Evaluation (Signed)
Anesthesia Post Note  Patient: Jeffrey Hardin  Procedure(s) Performed: ESOPHAGOGASTRODUODENOSCOPY (EGD) WITH PROPOFOL BALLOON DILATION     Patient location during evaluation: Endoscopy Anesthesia Type: MAC Level of consciousness: awake and alert Pain management: pain level controlled Vital Signs Assessment: post-procedure vital signs reviewed and stable Respiratory status: spontaneous breathing, nonlabored ventilation, respiratory function stable and patient connected to nasal cannula oxygen Cardiovascular status: blood pressure returned to baseline and stable Postop Assessment: no apparent nausea or vomiting Anesthetic complications: no  No notable events documented.  Last Vitals:  Vitals:   08/02/23 1130 08/02/23 1140  BP: (!) 136/53 (!) 152/66  Pulse: (!) 48 (!) 49  Resp: 15 16  Temp:    SpO2: 99% 99%    Last Pain:  Vitals:   08/02/23 1140  TempSrc:   PainSc: 0-No pain                  L 

## 2023-08-02 NOTE — Transfer of Care (Signed)
Immediate Anesthesia Transfer of Care Note  Patient: Ronny Sico Acklin  Procedure(s) Performed: ESOPHAGOGASTRODUODENOSCOPY (EGD) WITH PROPOFOL BALLOON DILATION  Patient Location: Endoscopy Unit  Anesthesia Type:MAC  Level of Consciousness: drowsy  Airway & Oxygen Therapy: Patient Spontanous Breathing and Patient connected to face mask  Post-op Assessment: Report given to RN and Post -op Vital signs reviewed and stable  Post vital signs: Reviewed and stable  Last Vitals:  Vitals Value Taken Time  BP    Temp    Pulse 48 08/02/23 1114  Resp 13 08/02/23 1114  SpO2 100 % 08/02/23 1114  Vitals shown include unfiled device data.  Last Pain:  Vitals:   08/02/23 0935  TempSrc: Temporal  PainSc: 0-No pain         Complications: No notable events documented.

## 2023-08-02 NOTE — Discharge Instructions (Signed)
YOU HAD AN ENDOSCOPIC PROCEDURE TODAY: Refer to the procedure report and other information in the discharge instructions given to you for any specific questions about what was found during the examination. If this information does not answer your questions, please call [place] office at [phone] to clarify.   YOU SHOULD EXPECT: Some feelings of bloating in the abdomen. Passage of more gas than usual. Walking can help get rid of the air that was put into your GI tract during the procedure and reduce the bloating. If you had a lower endoscopy (such as a colonoscopy or flexible sigmoidoscopy) you may notice spotting of blood in your stool or on the toilet paper. Some abdominal soreness may be present for a day or two, also.  DIET: Your first meal following the procedure should be a light meal and then it is ok to progress to your normal diet. A half-sandwich or bowl of soup is an example of a good first meal. Heavy or fried foods are harder to digest and may make you feel nauseous or bloated. Drink plenty of fluids but you should avoid alcoholic beverages for 24 hours. If you had a esophageal dilation, please see attached instructions for diet.    ACTIVITY: Your care partner should take you home directly after the procedure. You should plan to take it easy, moving slowly for the rest of the day. You can resume normal activity the day after the procedure however YOU SHOULD NOT DRIVE, use power tools, machinery or perform tasks that involve climbing or major physical exertion for 24 hours (because of the sedation medicines used during the test).   SYMPTOMS TO REPORT IMMEDIATELY: A gastroenterologist can be reached at any hour. Please call [phone]  for any of the following symptoms:   Following upper endoscopy (EGD, EUS, ERCP, esophageal dilation) Vomiting of blood or coffee ground material  New, significant abdominal pain  New, significant chest pain or pain under the shoulder blades  Painful or  persistently difficult swallowing  New shortness of breath  Black, tarry-looking or red, bloody stools  FOLLOW UP:  If any biopsies were taken you will be contacted by phone or by letter within the next 1-3 weeks. Call [phone]  if you have not heard about the biopsies in 3 weeks.  Please also call with any specific questions about appointments or follow up tests.  

## 2023-08-07 ENCOUNTER — Encounter (HOSPITAL_COMMUNITY): Payer: Self-pay | Admitting: Gastroenterology

## 2023-08-08 ENCOUNTER — Other Ambulatory Visit: Payer: Medicare Other

## 2023-08-08 DIAGNOSIS — E039 Hypothyroidism, unspecified: Secondary | ICD-10-CM | POA: Diagnosis not present

## 2023-08-09 ENCOUNTER — Telehealth: Payer: Self-pay

## 2023-08-09 NOTE — Telephone Encounter (Signed)
This telephone encounter note was added to conversation details on the lab report, then routed to Dr. Susann Givens for review.

## 2023-08-09 NOTE — Telephone Encounter (Signed)
Pt called back and was given message left in result notes. Pt stated he does not want any more appointments about his thyroid. His next procedure is 08/23/23.

## 2023-08-17 ENCOUNTER — Encounter (HOSPITAL_COMMUNITY): Payer: Self-pay | Admitting: Gastroenterology

## 2023-08-17 NOTE — Progress Notes (Signed)
Attempted to obtain medical history via telephone, unable to reach at this time. HIPAA compliant voicemail message left requesting return call to pre surgical testing department. 

## 2023-08-23 ENCOUNTER — Ambulatory Visit (HOSPITAL_COMMUNITY)
Admission: RE | Admit: 2023-08-23 | Discharge: 2023-08-23 | Disposition: A | Discharge status: Home / Self Care | Payer: Medicare Other | Attending: Gastroenterology | Admitting: Gastroenterology

## 2023-08-23 ENCOUNTER — Ambulatory Visit (HOSPITAL_COMMUNITY): Payer: Medicare Other | Admitting: Anesthesiology

## 2023-08-23 ENCOUNTER — Other Ambulatory Visit: Payer: Self-pay

## 2023-08-23 ENCOUNTER — Encounter (HOSPITAL_COMMUNITY): Payer: Self-pay | Admitting: Gastroenterology

## 2023-08-23 ENCOUNTER — Encounter (HOSPITAL_COMMUNITY)
Admission: RE | Disposition: A | Discharge status: Home / Self Care | Payer: Self-pay | Source: Home / Self Care | Attending: Gastroenterology

## 2023-08-23 DIAGNOSIS — K222 Esophageal obstruction: Secondary | ICD-10-CM | POA: Insufficient documentation

## 2023-08-23 DIAGNOSIS — Z538 Procedure and treatment not carried out for other reasons: Secondary | ICD-10-CM | POA: Insufficient documentation

## 2023-08-23 SURGERY — ESOPHAGOGASTRODUODENOSCOPY (EGD) WITH PROPOFOL
Anesthesia: Monitor Anesthesia Care

## 2023-08-23 SURGERY — CANCELLED PROCEDURE

## 2023-08-23 MED ORDER — LACTATED RINGERS IV SOLN: INTRAVENOUS | Status: DC

## 2023-08-23 SURGICAL SUPPLY — 15 items
BLOCK BITE 60FR ADLT L/F BLUE (MISCELLANEOUS) ×2 IMPLANT
ELECT REM PT RETURN 9FT ADLT (ELECTROSURGICAL)
ELECTRODE REM PT RTRN 9FT ADLT (ELECTROSURGICAL) IMPLANT
FORCEP RJ3 GP 1.8X160 W-NEEDLE (CUTTING FORCEPS) IMPLANT
FORCEPS BIOP RAD 4 LRG CAP 4 (CUTTING FORCEPS) IMPLANT
NDL SCLEROTHERAPY 25GX240 (NEEDLE) IMPLANT
NEEDLE SCLEROTHERAPY 25GX240 (NEEDLE)
PROBE APC STR FIRE (PROBE) IMPLANT
PROBE INJECTION GOLD (MISCELLANEOUS)
PROBE INJECTION GOLD 7FR (MISCELLANEOUS) IMPLANT
SNARE SHORT THROW 13M SML OVAL (MISCELLANEOUS) IMPLANT
SYR 50ML LL SCALE MARK (SYRINGE) IMPLANT
TUBING ENDO SMARTCAP PENTAX (MISCELLANEOUS) ×4
TUBING IRRIGATION ENDOGATOR (MISCELLANEOUS) ×2 IMPLANT
WATER STERILE IRR 1000ML POUR (IV SOLUTION) IMPLANT

## 2023-08-23 NOTE — H&P (Addendum)
[facility] History and Physical   Primary Care Physician:  Ronnald Nian, MD   Reason for Procedure:   Dilation of esophageal stricture  Plan:    EGD with dilation     HPI: Jeffrey Hardin is a 89 y.o. male undergoing repeat EGD with dilation.  He has a severe short segment peptic stricture in his distal esophagus which was not traversable with neonatal scope earlier this month.  The stricture was dilated to 8 mm with a balloon.  He has noted improvement in his swallowing since the dilation (was able to eat country-fried steak last night).     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   Thyroid disease    HYPOTHYROIDISM    Past Surgical History:  Procedure Laterality Date   BALLOON DILATION N/A 08/02/2023   Procedure: BALLOON DILATION;  Surgeon: Jenel Lucks, MD;  Location: [facility];  Service: Gastroenterology;  Laterality: N/A;   COLONOSCOPY  2007   Dr.Stark   ESOPHAGOGASTRODUODENOSCOPY (EGD) WITH PROPOFOL N/A 08/02/2023   Procedure: ESOPHAGOGASTRODUODENOSCOPY (EGD) WITH PROPOFOL;  Surgeon: Jenel Lucks, MD;  Location: [facility];  Service: Gastroenterology;  Laterality: N/A;   EXCISION MASS UPPER EXTREMETIES Right 03/21/2019   Procedure: EXCISION OF LARGE RIGHT AXILLARY MASS;  Surgeon: Almond Lint, MD;  Location: [facility];  Service: General;  Laterality: Right;   HIP DISARTICULATION Left 1957   sarcoma   MASS EXCISION N/A 01/24/2018   Procedure: EXCISION MALIGNANT  LESION OF ABDOMEN WITH LAYERED CLOSURE;  Surgeon: Glenna Fellows, MD;  Location: [facility];  Service: Plastics;  Laterality:  N/A;    Prior to Admission medications   Medication Sig Start Date End Date Taking? Authorizing Provider  amLODipine-benazepril (LOTREL) 5-10 MG capsule Take 2 capsules by mouth daily. 11/22/22  Yes Ronnald Nian, MD  aspirin EC 81 MG tablet Take 81 mg by mouth daily.   Yes [provider]  betamethasone dipropionate (DIPROLENE) 0.05 % ointment Apply topically daily. 11/22/22  Yes Ronnald Nian, MD  calcium carbonate (TUMS - DOSED IN MG ELEMENTAL CALCIUM) 500 MG chewable tablet Chew 2 tablets by mouth daily as needed for indigestion or heartburn.   Yes [provider]  Glucosamine HCl (GLUCOSAMINE PO) Take 1 tablet by mouth daily.   Yes [provider]  levothyroxine (SYNTHROID) 112 MCG tablet Take 1 tablet (112 mcg total) by mouth daily. 11/23/22  Yes Ronnald Nian, MD  Multiple Vitamins-Minerals (MULTIVITAMIN WITH MINERALS) tablet Take 1 tablet by mouth daily.     Yes [provider]  omeprazole (PRILOSEC) 20 MG capsule TAKE 1 CAPSULE(20 MG) BY MOUTH TWICE DAILY BEFORE A MEAL 06/29/23  Yes Jenel Lucks, MD  vitamin E 400 UNIT capsule Take 400 Units by mouth daily.   Yes [provider]    Current Facility-Administered Medications  Medication Dose Route Frequency Provider Last Rate Last Admin   lactated ringers infusion   Intravenous Continuous Jenel Lucks, MD 10 mL/hr at 08/23/23 0941 New Bag at 08/23/23 0941   Facility-Administered Medications Ordered in Other Encounters  Medication Dose Route Frequency Provider Last Rate Last Admin   influenza  inactive virus vaccine (FLUZONE/FLUARIX) injection 0.5 mL  0.5 mL Intramuscular Once Ronnald Nian, MD        Allergies as of 08/02/2023   (No Known Allergies)    Family History  Problem Relation Age of Onset   Cancer Sister     Social History   Socioeconomic History   Marital status: Married    Spouse name: Not on file   Number of children: Not on file   Years of  education: Not on file   Highest education level: Not on file  Occupational History   Not on file  Tobacco Use   Smoking status: Never   Smokeless tobacco: Never  Vaping Use   Vaping status: Never Used  Substance and Sexual Activity   Alcohol use: Yes    Alcohol/week: 20.0 standard drinks of alcohol    Types: 20 Cans of beer per week    Comment: 3 cans a day   Drug use: No   Sexual activity: Not Currently  Other Topics Concern   Not on file  Social History Narrative   Not on file   Social Determinants of Health   Financial Resource Strain: Low Risk  (11/05/2022)   Overall Financial Resource Strain (CARDIA)    Difficulty of Paying Living Expenses: Not hard at all  Food Insecurity: No Food Insecurity (11/05/2022)   Hunger Vital Sign    Worried About Running Out of Food in the Last Year: Never true    Ran Out of Food in the Last Year: Never true  Transportation Needs: No Transportation Needs (11/05/2022)   PRAPARE - Administrator, Civil Service (Medical): No    Lack of Transportation (Non-Medical): No  Physical Activity: Inactive (11/05/2022)   Exercise Vital Sign    Days of Exercise per Week: 0 days    Minutes of Exercise per Session: 0 min  Stress: No Stress Concern Present (11/05/2022)   [employer] of Occupational Health - Occupational Stress Questionnaire    Feeling of Stress : Not at all  Social Connections: Not on file  Intimate Partner Violence: Not on file    Review of Systems:  All other review of systems negative except as mentioned in the HPI.  Physical Exam: Vital signs BP (!) 182/48   Pulse (!) 33   Temp 97.6 F (36.4 C) (Temporal)   Resp 18   Ht 5\' 8"  (1.727 m)   Wt 56.7 kg   SpO2 100%   BMI 19.01 kg/m   General:   Alert,  Well-developed, well-nourished, pleasant and cooperative in NAD Airway:  Mallampati 1 Lungs:  Clear throughout to auscultation.   Heart:  Bradycardic; no murmurs, clicks, rubs,  or gallops. Abdomen:   Soft, nontender and nondistended. Normal bowel sounds.   Neuro/Psych:  Normal mood and affect. A and O x 3   Vung Kush E. Tomasa Rand, MD [facility]   ADDENDUM:  After further discussion with Dr. Jean Rosenthal of the anesthesia service, because of the patient's severe bradycardia (was noted to be in the 20s in pre-op) and the non-emergent nature of the procedure, the decision was made to cancel the procedure.    It was strongly recommended that the patient see a cardiologist for consideration of a pacemaker, but the patient declined a cardiology referral and states that he will follow up with Dr. Susann Givens.   The patient is aware that he would be unlikely to  be a candidate for an elective procedure until he has been seen by cardiology.    As the patient has gained 4 kg in the past month, and is able to eat solid food (albeit very carefully), I am hopeful he can continue to maintain his nutrition without further dilations.

## 2023-08-23 NOTE — Anesthesia Preprocedure Evaluation (Addendum)
Anesthesia Evaluation    Airway Mallampati: II  TM Distance: >3 FB Neck ROM: Full    Dental  (+) Poor Dentition, Dental Advisory Given, Missing   Pulmonary    breath sounds clear to auscultation       Cardiovascular hypertension, Pt. on medications + dysrhythmias Atrial Fibrillation  Rhythm:Irregular Rate:Bradycardia  HR afib, rate 27  Pt has not followed up with Cardiology, missed last appointment Recent Fam Practice note:  Bradycardia History of atrial fibrillation I discussed this with him in detail and my recommendation would be to get cardiology to sign off however he is not interested in delaying this any further and wants to get the EGD done.  He will except any cardiac risk recognizing the potential for death. Susann Givens, MD      Neuro/Psych    GI/Hepatic ,GERD  Medicated,,(+) Hepatitis -, B  Endo/Other  Hypothyroidism    Renal/GU      Musculoskeletal Sarcoma: s/p hip disartic   Abdominal   Peds  Hematology   Anesthesia Other Findings   Reproductive/Obstetrics                             Anesthesia Physical Anesthesia Plan  ASA: 4  Anesthesia Plan:    Post-op Pain Management:    Induction:   PONV Risk Score and Plan:   Airway Management Planned:   Additional Equipment:   Intra-op Plan:   Post-operative Plan:   Informed Consent:   Plan Discussed with:   Anesthesia Plan Comments: (Pt willing to risk death to proceed, yet cancelled pending eval for pacemaker HR 20's  )       Anesthesia Quick Evaluation

## 2023-08-23 NOTE — Progress Notes (Signed)
Admitted to Endo with H.R. in 30's. Evaluated by anesthesia. Patient did not have cardiac consult prior to procedure. Per discussion with anesthesia and physician, it was decide to cancel the procedure for safety of the patient. Patient verbalized understanding. Patient to follow up in office as needed.

## 2023-08-25 ENCOUNTER — Inpatient Hospital Stay: Payer: Medicare Other | Attending: Internal Medicine

## 2023-08-25 ENCOUNTER — Inpatient Hospital Stay (HOSPITAL_BASED_OUTPATIENT_CLINIC_OR_DEPARTMENT_OTHER): Payer: Medicare Other | Admitting: Internal Medicine

## 2023-08-25 VITALS — BP 176/61 | HR 40 | Temp 98.2°F | Resp 15 | Ht 68.0 in | Wt 118.0 lb

## 2023-08-25 DIAGNOSIS — R001 Bradycardia, unspecified: Secondary | ICD-10-CM | POA: Diagnosis not present

## 2023-08-25 DIAGNOSIS — C4492 Squamous cell carcinoma of skin, unspecified: Secondary | ICD-10-CM | POA: Diagnosis not present

## 2023-08-25 DIAGNOSIS — Z85828 Personal history of other malignant neoplasm of skin: Secondary | ICD-10-CM | POA: Insufficient documentation

## 2023-08-25 LAB — CMP (CANCER CENTER ONLY)
ALT: 7 U/L (ref 0–44)
AST: 13 U/L — ABNORMAL LOW (ref 15–41)
Albumin: 3.6 g/dL (ref 3.5–5.0)
Alkaline Phosphatase: 45 U/L (ref 38–126)
Anion gap: 3 — ABNORMAL LOW (ref 5–15)
BUN: 10 mg/dL (ref 8–23)
CO2: 30 mmol/L (ref 22–32)
Calcium: 8.9 mg/dL (ref 8.9–10.3)
Chloride: 102 mmol/L (ref 98–111)
Creatinine: 0.68 mg/dL (ref 0.61–1.24)
GFR, Estimated: >60 mL/min (ref >60)
Glucose, Bld: 106 mg/dL — ABNORMAL HIGH (ref 70–99)
Potassium: 4.2 mmol/L (ref 3.5–5.1)
Sodium: 135 mmol/L (ref 135–145)
Total Bilirubin: 0.5 mg/dL (ref 0.3–1.2)
Total Protein: 6.6 g/dL (ref 6.5–8.1)

## 2023-08-25 LAB — CBC WITH DIFFERENTIAL (CANCER CENTER ONLY)
Abs Immature Granulocytes: 0.01 10*3/uL (ref 0.00–0.07)
Basophils Absolute: 0.1 10*3/uL (ref 0.0–0.1)
Basophils Relative: 1 %
Eosinophils Absolute: 0.1 10*3/uL (ref 0.0–0.5)
Eosinophils Relative: 2 %
HCT: 38.4 % — ABNORMAL LOW (ref 39.0–52.0)
Hemoglobin: 13.2 g/dL (ref 13.0–17.0)
Immature Granulocytes: 0 %
Lymphocytes Relative: 16 %
Lymphs Abs: 1.1 10*3/uL (ref 0.7–4.0)
MCH: 32 pg (ref 26.0–34.0)
MCHC: 34.4 g/dL (ref 30.0–36.0)
MCV: 93.2 fL (ref 80.0–100.0)
Monocytes Absolute: 0.6 10*3/uL (ref 0.1–1.0)
Monocytes Relative: 9 %
Neutro Abs: 4.9 10*3/uL (ref 1.7–7.7)
Neutrophils Relative %: 72 %
Platelet Count: 286 10*3/uL (ref 150–400)
RBC: 4.12 MIL/uL — ABNORMAL LOW (ref 4.22–5.81)
RDW: 13.1 % (ref 11.5–15.5)
WBC Count: 6.8 10*3/uL (ref 4.0–10.5)
nRBC: 0 % (ref 0.0–0.2)

## 2023-08-25 LAB — LACTATE DEHYDROGENASE: LDH: 112 U/L (ref 98–192)

## 2023-08-25 NOTE — Progress Notes (Signed)
[facility] Telephone:([phone]) [phone]   [phone]  OFFICE PROGRESS NOTE  Ronnald Nian, MD [address] [place] [address]  DIAGNOSIS: Squamous cell carcinoma of the skin with axillary and inguinal adenopathy diagnosed in 2018  PRIOR THERAPY: 1) status post excision of her malignant lesion measuring 11 x 10 cm and layered closure completed January 24, 2018 by Dr. Leta Baptist.  The final pathology showed invasive squamous cell carcinoma with moderate differentiation measuring 8.8 cm with negative margin. 2) Status post excision of axillary mass completed by Dr. Donell Beers on March 21, 2019 and the final pathology showed moderate to poorly differentiated squamous cell carcinoma. 3) Status post treatment with Libtayo (Cempilimab) 350 Mg IV every 3 weeks started 06/23/2018 and completed June 2020.  CURRENT THERAPY: Observation.  INTERVAL HISTORY: Jeffrey Hardin 89 y.o. male returns to the clinic today for 36-month follow-up visit.  The patient is feeling fine today with no concerning complaints except for the baseline bradycardia.  He had EKG recently for evaluation before esophageal dilatation and it was consistent with junctional bradycardia.  The patient denied having any current chest pain, shortness of breath, cough or hemoptysis.  He has no nausea, vomiting, diarrhea or constipation.  He has no headache or visual changes.  He has no recent weight loss or night sweats.  He is here today for evaluation and repeat blood work.  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  MEDICATIONS:  Current Outpatient Medications  Medication Sig Dispense Refill   amLODipine-benazepril (LOTREL) 5-10 MG capsule Take 2 capsules by mouth daily. 180 capsule 3   aspirin EC 81 MG tablet Take 81 mg by mouth daily.     betamethasone dipropionate (DIPROLENE) 0.05 % ointment Apply topically daily. 45 g 5   calcium carbonate (TUMS - DOSED IN MG ELEMENTAL CALCIUM) 500 MG chewable tablet Chew 2 tablets by mouth daily as needed for indigestion or heartburn.     Glucosamine HCl (GLUCOSAMINE PO) Take 1 tablet by mouth daily.     levothyroxine (SYNTHROID) 112 MCG tablet Take 1 tablet (112 mcg total) by mouth daily. 90 tablet 3   Multiple Vitamins-Minerals (MULTIVITAMIN WITH MINERALS) tablet Take 1 tablet by mouth daily.       omeprazole (PRILOSEC) 20 MG capsule TAKE 1 CAPSULE(20 MG) BY MOUTH TWICE DAILY BEFORE A MEAL 180 capsule 0   vitamin E 400 UNIT capsule Take 400 Units by mouth daily.     No current facility-administered medications for this visit.   Facility-Administered Medications Ordered in Other Visits  Medication Dose Route Frequency Provider Last Rate Last Admin   influenza  inactive virus vaccine (FLUZONE/FLUARIX) injection 0.5 mL  0.5 mL Intramuscular Once Ronnald Nian, MD        SURGICAL HISTORY:  Past Surgical History:  Procedure Laterality Date   BALLOON DILATION N/A 08/02/2023   Procedure: BALLOON DILATION;  Surgeon: Jenel Lucks, MD;  Location: [facility];  Service:  Gastroenterology;  Laterality: N/A;   COLONOSCOPY  2007   Dr.Stark   ESOPHAGOGASTRODUODENOSCOPY (EGD) WITH PROPOFOL N/A 08/02/2023   Procedure: ESOPHAGOGASTRODUODENOSCOPY (EGD) WITH PROPOFOL;  Surgeon: Jenel Lucks, MD;  Location: [facility];  Service: Gastroenterology;  Laterality: N/A;   EXCISION MASS UPPER EXTREMETIES Right 03/21/2019   Procedure: EXCISION OF LARGE RIGHT AXILLARY MASS;  Surgeon: Almond Lint, MD;  Location:  [facility];  Service: General;  Laterality: Right;   HIP DISARTICULATION Left 1957   sarcoma   MASS EXCISION N/A 01/24/2018   Procedure: EXCISION MALIGNANT  LESION OF ABDOMEN WITH LAYERED CLOSURE;  Surgeon: Glenna Fellows, MD;  Location: [facility];  Service: Plastics;  Laterality: N/A;    REVIEW OF SYSTEMS:  A comprehensive review of systems was negative except for: Constitutional: positive for fatigue   PHYSICAL EXAMINATION: General appearance: alert, cooperative, and no distress Head: Normocephalic, without obvious abnormality, atraumatic Neck: no adenopathy, no JVD, supple, symmetrical, trachea midline, and thyroid not enlarged, symmetric, no tenderness/mass/nodules Lymph nodes: Cervical, supraclavicular, and axillary nodes normal. Resp: clear to auscultation bilaterally Back: symmetric, no curvature. ROM normal. No CVA tenderness. Cardio: regular rate and rhythm, S1, S2 normal, no murmur, click, rub or gallop GI: soft, non-tender; bowel sounds normal; no masses,  no organomegaly Extremities: extremities normal, atraumatic, no cyanosis or edema  ECOG PERFORMANCE STATUS: 1 - Symptomatic but completely ambulatory  Blood pressure (!) 176/61, pulse (!) 40, temperature 98.2 F (36.8 C), temperature source Oral, resp. rate 15, height 5\' 8"  (1.727 m), weight 118 lb (53.5 kg), SpO2 98%.  LABORATORY DATA: Lab Results  Component Value Date   WBC 6.8 08/25/2023   HGB 13.2 08/25/2023   HCT 38.4 (L) 08/25/2023   MCV 93.2 08/25/2023   PLT 286 08/25/2023      Chemistry      Component Value Date/Time   NA 135 11/22/2022 1022   K 4.3 11/22/2022 1022   CL 99 11/22/2022 1022   CO2 23 11/22/2022 1022   BUN 15 11/22/2022 1022   CREATININE 0.66 (L) 11/22/2022 1022   CREATININE 0.70 05/30/2019 1243   CREATININE 0.63 (L) 01/05/2017 0934      Component Value Date/Time   CALCIUM 9.6 11/22/2022 1022   ALKPHOS 61 11/22/2022 1022   AST 17 11/22/2022 1022   AST 17 05/30/2019 1243    ALT 11 11/22/2022 1022   ALT 9 05/30/2019 1243   BILITOT 0.4 11/22/2022 1022   BILITOT 0.4 05/30/2019 1243       RADIOGRAPHIC STUDIES: No results found.  ASSESSMENT AND PLAN: This is a very pleasant 89 years old white male with Squamous cell carcinoma of the skin with axillary and inguinal adenopathy diagnosed in 2018 He is status post the following treatment:  1) status post excision of her malignant lesion measuring 11 x 10 cm and layered closure completed January 24, 2018 by Dr. Leta Baptist.  The final pathology showed invasive squamous cell carcinoma with moderate differentiation measuring 8.8 cm with negative margin. 2) Status post excision of axillary mass completed by Dr. Donell Beers on March 21, 2019 and the final pathology showed moderate to poorly differentiated squamous cell carcinoma. 3) Status post treatment with Libtayo (Cempilimab) 350 Mg IV every 3 weeks started 06/23/2018 and completed June 2020. The patient is currently on observation and feeling fine with no concerning complaints. Repeat blood work today is unremarkable. I recommended for him to continue on observation with repeat blood work in 6 months. For the bradycardia the patient has  baseline bradycardia for a long time and he had several EKGs in the past.  The last one performed in early August 2024 showed junctional bradycardia. He was advised to call immediately if he has any other concerning symptoms in the interval. The patient voices understanding of current disease status and treatment options and is in agreement with the current care plan.  All questions were answered. The patient knows to call the clinic with any problems, questions or concerns. We can certainly see the patient much sooner if necessary. The total time spent in the appointment was 20 minutes.  Disclaimer: This note was dictated with voice recognition software. Similar sounding words can inadvertently be transcribed and may not be corrected upon  review.

## 2023-12-27 ENCOUNTER — Ambulatory Visit: Payer: Medicare Other

## 2023-12-27 DIAGNOSIS — Z0000 Encounter for general adult medical examination without abnormal findings: Secondary | ICD-10-CM

## 2023-12-27 NOTE — Patient Instructions (Signed)
 Jeffrey Hardin , Thank you for taking time to come for your Medicare Wellness Visit. I appreciate your ongoing commitment to your health goals. Please review the following plan we discussed and let me know if I can assist you in the future.   Referrals/Orders/Follow-Ups/Clinician Recommendations: none  This is a list of the screening recommended for you and due dates:  Health Maintenance  Topic Date Due   DTaP/Tdap/Td vaccine (2 - Td or Tdap) 08/22/2018   Medicare Annual Wellness Visit  12/26/2024   Pneumonia Vaccine  Completed   Flu Shot  Completed   HPV Vaccine  Aged Out   COVID-19 Vaccine  Discontinued   Zoster (Shingles) Vaccine  Discontinued    Advanced directives: (Copy Requested) Please bring a copy of your health care power of attorney and living will to the office to be added to your chart at your convenience.  Next Medicare Annual Wellness Visit scheduled for next year: Yes  insert Preventive Care attachment Insert FALL PREVENTION attachment if needed

## 2023-12-27 NOTE — Progress Notes (Signed)
 Subjective:   Jeffrey Hardin is a 89 y.o. male who presents for Medicare Annual/Subsequent preventive examination.  Visit Complete: Virtual I connected with  Jeffrey Hardin on 12/27/23 by a audio enabled telemedicine application and verified that I am speaking with the correct person using two identifiers.  Patient Location: Home  Provider Location: Office/Clinic  I discussed the limitations of evaluation and management by telemedicine. The patient expressed understanding and agreed to proceed.  Vital Signs: Because this visit was a virtual/telehealth visit, some criteria may be missing or patient reported. Any vitals not documented were not able to be obtained and vitals that have been documented are patient reported.    Cardiac Risk Factors include: advanced age (>66men, >74 women);dyslipidemia;hypertension;male gender     Objective:    Today's Vitals   There is no height or weight on file to calculate BMI.     12/27/2023    9:51 AM 08/23/2023    9:23 AM 08/02/2023    9:32 AM 11/05/2022    9:30 AM 10/29/2021    9:35 AM 10/14/2020   10:07 AM 01/10/2020    2:52 PM  Advanced Directives  Does Patient Have a Medical Advance Directive? Yes Yes Yes Yes Yes Yes Yes  Type of Estate Agent of [place];Living will Healthcare Power of [place];Living will Living will;Healthcare Power of [employer] Power of [place];Living will Living will;Healthcare Power of [place];Out of facility DNR (pink MOST or yellow form) Living will   Does patient want to make changes to medical advance directive?     No - Patient declined No - Patient declined   Copy of Healthcare Power of Attorney in Chart? No - copy requested No - copy requested  Yes - validated most recent copy scanned in chart (See row information) Yes - validated most recent copy scanned in chart (See row information)      Current Medications (verified) Outpatient Encounter Medications as of 12/27/2023   Medication Sig   amLODipine -benazepril  (LOTREL) 5-10 MG capsule Take 2 capsules by mouth daily.   aspirin EC 81 MG tablet Take 81 mg by mouth daily.   betamethasone  dipropionate (DIPROLENE ) 0.05 % ointment Apply topically daily.   Glucosamine HCl (GLUCOSAMINE PO) Take 1 tablet by mouth daily.   levothyroxine  (SYNTHROID ) 112 MCG tablet Take 1 tablet (112 mcg total) by mouth daily.   Multiple Vitamins-Minerals (MULTIVITAMIN WITH MINERALS) tablet Take 1 tablet by mouth daily.     omeprazole  (PRILOSEC) 20 MG capsule TAKE 1 CAPSULE(20 MG) BY MOUTH TWICE DAILY BEFORE A MEAL   vitamin E 400 UNIT capsule Take 400 Units by mouth daily.   calcium carbonate (TUMS - DOSED IN MG ELEMENTAL CALCIUM) 500 MG chewable tablet Chew 2 tablets by mouth daily as needed for indigestion or heartburn. (Patient not taking: Reported on 12/27/2023)   Facility-Administered Encounter Medications as of 12/27/2023  Medication   influenza  inactive virus vaccine (FLUZONE/FLUARIX) injection 0.5 mL    Allergies (verified) Patient has no known allergies.   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   Thyroid  disease  HYPOTHYROIDISM   Past Surgical History:  Procedure Laterality Date   BALLOON DILATION N/A 08/02/2023   Procedure: BALLOON DILATION;  Surgeon: Stacia Glendia BRAVO, MD;  Location: [facility];  Service: Gastroenterology;  Laterality: N/A;   COLONOSCOPY  2007   Dr.Stark   ESOPHAGOGASTRODUODENOSCOPY (EGD) WITH PROPOFOL  N/A 08/02/2023   Procedure: ESOPHAGOGASTRODUODENOSCOPY (EGD) WITH PROPOFOL ;  Surgeon: Stacia Glendia BRAVO, MD;  Location: [facility];   Service: Gastroenterology;  Laterality: N/A;   EXCISION MASS UPPER EXTREMETIES Right 03/21/2019   Procedure: EXCISION OF LARGE RIGHT AXILLARY MASS;  Surgeon: Aron Shoulders, MD;  Location: [facility];  Service: General;  Laterality: Right;   HIP DISARTICULATION Left 1957   sarcoma   MASS EXCISION N/A 01/24/2018   Procedure: EXCISION MALIGNANT  LESION OF ABDOMEN WITH LAYERED CLOSURE;  Surgeon: Arelia Filippo, MD;  Location: [facility];  Service: Plastics;  Laterality: N/A;   Family History  Problem Relation Age of Onset   Cancer Sister    Social History   Socioeconomic History   Marital status: Married    Spouse name: Not on file   Number of children: Not on file   Years of education: Not on file   Highest education level: Not on file  Occupational History   Not on file  Tobacco Use   Smoking status: Never   Smokeless tobacco: Never  Vaping Use   Vaping status: Never Used  Substance and Sexual Activity   Alcohol use: Yes    Alcohol/week: 20.0 standard drinks of alcohol    Types: 20 Cans of beer per week    Comment: 3 cans a day   Drug use: No   Sexual activity: Not Currently  Other Topics Concern   Not on file  Social History Narrative   Not on file   Social Drivers of Health   Financial Resource Strain: Low Risk  (12/27/2023)   Overall Financial Resource Strain (CARDIA)    Difficulty of Paying Living Expenses: Not hard at all  Food Insecurity: No Food Insecurity (12/27/2023)   Hunger Vital Sign    Worried About Running Out of Food in the Last Year: Never true    Ran Out of Food in the Last Year: Never true  Transportation Needs: No Transportation Needs (12/27/2023)   PRAPARE - Administrator, Civil Service (Medical): No    Lack of Transportation (Non-Medical): No  Physical Activity: Inactive (12/27/2023)   Exercise Vital Sign    Days of Exercise per Week: 0 days    Minutes of Exercise per Session: 0 min  Stress: No Stress Concern Present  (12/27/2023)   [employer] of Occupational Health - Occupational Stress Questionnaire    Feeling of Stress : Not at all  Social Connections: Moderately Integrated (12/27/2023)   Social Connection and Isolation Panel [NHANES]    Frequency of Communication with Friends and Family: More than three times a week    Frequency of Social Gatherings with Friends and Family: Twice a week    Attends Religious Services: More than 4 times per year    Active Member of [employer] or Organizations: No    Attends Engineer, Structural: Never    Marital Status: Married    Tobacco Counseling Counseling given: Not Answered   Clinical Intake:  Pre-visit preparation completed: Yes  Pain : No/denies pain     Nutritional Risks: None Diabetes: No  How often do you need to have someone help you when you read instructions, pamphlets, or  other written materials from your doctor or pharmacy?: 1 - Never  Interpreter Needed?: No  Information entered by :: NAllen LPN   Activities of Daily Living    12/27/2023    9:45 AM  In your present state of health, do you have any difficulty performing the following activities:  Hearing? 0  Vision? 0  Difficulty concentrating or making decisions? 0  Walking or climbing stairs? 0  Dressing or bathing? 0  Doing errands, shopping? 0  Preparing Food and eating ? N  Using the Toilet? N  In the past six months, have you accidently leaked urine? Y  Comment at night sometimes  Do you have problems with loss of bowel control? N  Managing your Medications? N  Managing your Finances? N  Housekeeping or managing your Housekeeping? N    Patient Care Team: Joyce Norleen BROCKS, MD as PCP - General (Family Medicine)  Indicate any recent Medical Services you may have received from other than [facility] providers in the past year (date may be approximate).     Assessment:   This is a routine wellness examination for Jeffrey Hardin.  Hearing/Vision screen Hearing  Screening - Comments:: Denies hearing issues Vision Screening - Comments:: No regular eye exams, [facility]   Goals Addressed             This Visit's Progress    Patient Stated       12/27/2023, eat healthy       Depression Screen    12/27/2023    9:54 AM 11/22/2022    9:47 AM 11/05/2022    9:32 AM 10/29/2021    9:31 AM 10/14/2020   10:08 AM 01/05/2017    8:41 AM 12/13/2013   11:18 AM  PHQ 2/9 Scores  PHQ - 2 Score 0 0 0 0 0 0 0  PHQ- 9 Score   0        Fall Risk    12/27/2023    9:52 AM 11/22/2022    9:46 AM 11/05/2022    9:31 AM 10/29/2021    9:31 AM 10/14/2020   10:08 AM  Fall Risk   Falls in the past year? 1 1 1  0 1  Comment leg gives out  trips    Number falls in past yr: 1 0 1 0 1  Injury with Fall? 0 0 0 0 0  Risk for fall due to : Impaired mobility;Impaired balance/gait;Medication side effect No Fall Risks Medication side effect;Impaired balance/gait;History of fall(s) No Fall Risks Impaired mobility  Follow up Falls prevention discussed;Falls evaluation completed Falls evaluation completed Falls prevention discussed;Falls evaluation completed;Education provided Falls evaluation completed     MEDICARE RISK AT HOME: Medicare Risk at Home Any stairs in or around the home?: Yes If so, are there any without handrails?: No Home free of loose throw rugs in walkways, pet beds, electrical cords, etc?: Yes Adequate lighting in your home to reduce risk of falls?: Yes Life alert?: No Use of a cane, walker or w/c?: No Grab bars in the bathroom?: No Shower chair or bench in shower?: No Elevated toilet seat or a handicapped toilet?: Yes  TIMED UP AND GO:  Was the test performed?  No    Cognitive Function:        12/27/2023    9:54 AM 11/05/2022    9:34 AM  6CIT Screen  What Year? 0 points 0 points  What month? 0 points 0 points  What time? 0 points 0 points  Count back from  20 0 points 0 points  Months in reverse 2 points 0 points  Repeat phrase 0  points 0 points  Total Score 2 points 0 points    Immunizations Immunization History  Administered Date(s) Administered   Fluad Quad(high Dose 65+) 09/24/2019, 10/14/2020, 10/29/2021   Influenza Split 09/28/2011, 10/18/2012   Influenza Whole 11/06/1999, 09/23/2008, 09/08/2010   Influenza, High Dose Seasonal PF 11/02/2016, 09/27/2017   Influenza,inj,Quad PF,6+ Mos 09/11/2013, 11/22/2022   Influenza-Unspecified 10/01/2003, 09/10/2018   PFIZER(Purple Top)SARS-COV-2 Vaccination 01/17/2020, 02/07/2020, 09/04/2020   Pneumococcal Conjugate-13 04/22/2015   Pneumococcal Polysaccharide-23 08/22/2008   Tdap 08/22/2008    TDAP status: Due, Education has been provided regarding the importance of this vaccine. Advised may receive this vaccine at local pharmacy or Health Dept. Aware to provide a copy of the vaccination record if obtained from local pharmacy or Health Dept. Verbalized acceptance and understanding.  Flu Vaccine status: Up to date  Pneumococcal vaccine status: Up to date  Covid-19 vaccine status: Information provided on how to obtain vaccines.   Qualifies for Shingles Vaccine? Yes   Zostavax completed No   Shingrix Completed?: No.    Education has been provided regarding the importance of this vaccine. Patient has been advised to call insurance company to determine out of pocket expense if they have not yet received this vaccine. Advised may also receive vaccine at local pharmacy or Health Dept. Verbalized acceptance and understanding.  Screening Tests Health Maintenance  Topic Date Due   DTaP/Tdap/Td (2 - Td or Tdap) 08/22/2018   Medicare Annual Wellness (AWV)  12/26/2024   Pneumonia Vaccine 41+ Years old  Completed   INFLUENZA VACCINE  Completed   HPV VACCINES  Aged Out   COVID-19 Vaccine  Discontinued   Zoster Vaccines- Shingrix  Discontinued    Health Maintenance  Health Maintenance Due  Topic Date Due   DTaP/Tdap/Td (2 - Td or Tdap) 08/22/2018    Colorectal  cancer screening: No longer required.   Lung Cancer Screening: (Low Dose CT Chest recommended if Age 33-80 years, 20 pack-year currently smoking OR have quit w/in 15years.) does not qualify.   Lung Cancer Screening Referral: no  Additional Screening:  Hepatitis C Screening: does not qualify;   Vision Screening: Recommended annual ophthalmology exams for early detection of glaucoma and other disorders of the eye. Is the patient up to date with their annual eye exam?  No  Who is the provider or what is the name of the office in which the patient attends annual eye exams? [facility] If pt is not established with a provider, would they like to be referred to a provider to establish care? No .   Dental Screening: Recommended annual dental exams for proper oral hygiene  Diabetic Foot Exam: n/a  Community Resource Referral / Chronic Care Management: CRR required this visit?  No   CCM required this visit?  No     Plan:     I have personally reviewed and noted the following in the patient's chart:   Medical and social history Use of alcohol, tobacco or illicit drugs  Current medications and supplements including opioid prescriptions. Patient is not currently taking opioid prescriptions. Functional ability and status Nutritional status Physical activity Advanced directives List of other physicians Hospitalizations, surgeries, and ER visits in previous 12 months Vitals Screenings to include cognitive, depression, and falls Referrals and appointments  In addition, I have reviewed and discussed with patient certain preventive protocols, quality metrics, and best practice recommendations. A written personalized care  plan for preventive services as well as general preventive health recommendations were provided to patient.     Ardella FORBES Dawn, LPN   [id number]   After Visit Summary: (Pick Up) Due to this being a telephonic visit, with patients personalized plan was offered to patient  and patient has requested to Pick up at office.  Nurse Notes: none

## 2024-02-23 ENCOUNTER — Inpatient Hospital Stay: Payer: Medicare Other | Attending: Family Medicine

## 2024-02-23 ENCOUNTER — Inpatient Hospital Stay: Payer: Medicare Other | Admitting: Internal Medicine

## 2024-02-28 ENCOUNTER — Other Ambulatory Visit: Payer: Self-pay | Admitting: Gastroenterology

## 2024-02-28 DIAGNOSIS — R1310 Dysphagia, unspecified: Secondary | ICD-10-CM

## 2024-05-14 ENCOUNTER — Other Ambulatory Visit: Payer: Self-pay | Admitting: Family Medicine

## 2024-05-14 DIAGNOSIS — E039 Hypothyroidism, unspecified: Secondary | ICD-10-CM

## 2024-05-14 NOTE — Telephone Encounter (Signed)
 Last apt 07/28/23 last TSH check 08/08/23 and it was abnormal does he need to come back in for recheck or appointment?

## 2024-05-30 ENCOUNTER — Telehealth: Payer: Self-pay

## 2024-05-30 NOTE — Telephone Encounter (Signed)
 New RX Request Amlodipine / Atorvastatin tabs 5/10mg  [facility]

## 2024-06-05 ENCOUNTER — Other Ambulatory Visit: Payer: Self-pay | Admitting: Family Medicine

## 2024-06-11 ENCOUNTER — Telehealth: Payer: Self-pay | Admitting: Family Medicine

## 2024-06-11 DIAGNOSIS — I10 Essential (primary) hypertension: Secondary | ICD-10-CM

## 2024-06-11 MED ORDER — AMLODIPINE BESY-BENAZEPRIL HCL 5-10 MG PO CAPS: 2.0000 | ORAL_CAPSULE | Freq: Every day | ORAL | 3 refills | Status: AC

## 2024-06-11 NOTE — Telephone Encounter (Signed)
 [facility]  Amlodipine /atorvastatin  5/10 mg

## 2024-06-24 DIAGNOSIS — Z8719 Personal history of other diseases of the digestive system: Secondary | ICD-10-CM | POA: Insufficient documentation

## 2024-06-26 ENCOUNTER — Ambulatory Visit (INDEPENDENT_AMBULATORY_CARE_PROVIDER_SITE_OTHER): Admitting: Family Medicine

## 2024-06-26 ENCOUNTER — Encounter: Payer: Self-pay | Admitting: Family Medicine

## 2024-06-26 VITALS — BP 130/70 | HR 40 | Wt 119.0 lb

## 2024-06-26 DIAGNOSIS — I110 Hypertensive heart disease with heart failure: Secondary | ICD-10-CM | POA: Diagnosis not present

## 2024-06-26 DIAGNOSIS — Z2821 Immunization not carried out because of patient refusal: Secondary | ICD-10-CM

## 2024-06-26 DIAGNOSIS — E785 Hyperlipidemia, unspecified: Secondary | ICD-10-CM | POA: Diagnosis not present

## 2024-06-26 DIAGNOSIS — I10 Essential (primary) hypertension: Secondary | ICD-10-CM | POA: Diagnosis not present

## 2024-06-26 DIAGNOSIS — Z8679 Personal history of other diseases of the circulatory system: Secondary | ICD-10-CM

## 2024-06-26 DIAGNOSIS — Z8719 Personal history of other diseases of the digestive system: Secondary | ICD-10-CM | POA: Diagnosis not present

## 2024-06-26 DIAGNOSIS — C4492 Squamous cell carcinoma of skin, unspecified: Secondary | ICD-10-CM | POA: Diagnosis not present

## 2024-06-26 DIAGNOSIS — Z0000 Encounter for general adult medical examination without abnormal findings: Secondary | ICD-10-CM

## 2024-06-26 DIAGNOSIS — Z7901 Long term (current) use of anticoagulants: Secondary | ICD-10-CM

## 2024-06-26 DIAGNOSIS — E038 Other specified hypothyroidism: Secondary | ICD-10-CM | POA: Diagnosis not present

## 2024-06-26 NOTE — Progress Notes (Signed)
   Subjective:    Patient ID: Jeffrey Hardin, male    DOB: 17-Mar-1934, 89 y.o.   MRN: [id number]  HPI He is here for medication management.  He continues on his blood pressure medication as well as Synthroid  and have been no difficulties with that.  He he does use Prilosec on an as-needed basis and does have a stricture and does chew his food up to keep this under control.  He is not interested in immunizations and is only taking aspirin for his underlying cardiac condition.  He recognizes the risk of all this and is not at all interested in changing anything.  Multivitamin.  He does follow-up regularly with Dr. Sherrod for his cancer which seems to be stable.  He has no other concerns or complaints.  Does not smoke or drink.  He and his wife travel a lot and he is enjoying his present life.   Review of Systems     Objective:    Physical Exam Alert and in no distress. Tympanic membranes and canals are normal. Pharyngeal area is normal. Neck is supple without adenopathy or thyromegaly. Cardiac exam shows a regular rhythm without murmurs or gallops. Lungs are clear to auscultation.  Review of recent EKG does show a junctional rhythm.        Assessment & Plan:  Routine general medical examination at a health care facility  Essential hypertension - Plan: CBC with Differential/Platelet, Comprehensive metabolic panel with GFR  Chronic anticoagulation  Hyperlipidemia with target LDL less than 100 - Plan: Lipid panel  Hypertensive heart disease with congestive heart failure, unspecified heart failure type (HCC)  Other specified hypothyroidism - Plan: TSH  Squamous cell carcinoma of skin  History of esophageal stricture  History of atrial fibrillation - Does not want DOAC  Immunization refused His medications involved and renewed.  Recheck here in 1 year.

## 2024-06-27 ENCOUNTER — Ambulatory Visit: Payer: Self-pay | Admitting: Family Medicine

## 2024-06-27 LAB — CBC WITH DIFFERENTIAL/PLATELET
Basophils Absolute: 0.1 10*3/uL (ref 0.0–0.2)
Basos: 1 %
EOS (ABSOLUTE): 0.1 x10E3/uL (ref 0.0–0.4)
Eos: 3 %
Hematocrit: 41.2 % (ref 37.5–51.0)
Hemoglobin: 13.1 g/dL (ref 13.0–17.7)
Immature Grans (Abs): 0 x10E3/uL (ref 0.0–0.1)
Immature Granulocytes: 0 %
Lymphocytes Absolute: 1.2 10*3/uL (ref 0.7–3.1)
Lymphs: 22 %
MCH: 32.1 pg (ref 26.6–33.0)
MCHC: 31.8 g/dL (ref 31.5–35.7)
MCV: 101 fL — ABNORMAL HIGH (ref 79–97)
Monocytes Absolute: 0.5 10*3/uL (ref 0.1–0.9)
Monocytes: 10 %
Neutrophils Absolute: 3.5 10*3/uL (ref 1.4–7.0)
Neutrophils: 64 %
Platelets: 284 10*3/uL (ref 150–450)
RBC: 4.08 x10E6/uL — ABNORMAL LOW (ref 4.14–5.80)
RDW: 12.3 % (ref 11.6–15.4)
WBC: 5.4 10*3/uL (ref 3.4–10.8)

## 2024-06-27 LAB — COMPREHENSIVE METABOLIC PANEL WITH GFR
ALT: 7 IU/L (ref 0–44)
AST: 13 IU/L (ref 0–40)
Albumin: 3.7 g/dL (ref 3.7–4.7)
Alkaline Phosphatase: 54 IU/L (ref 44–121)
BUN/Creatinine Ratio: 17 (ref 10–24)
BUN: 11 mg/dL (ref 8–27)
Bilirubin Total: 0.3 mg/dL (ref 0.0–1.2)
CO2: 23 mmol/L (ref 20–29)
Calcium: 8.9 mg/dL (ref 8.6–10.2)
Chloride: 99 mmol/L (ref 96–106)
Creatinine, Ser: 0.63 mg/dL — ABNORMAL LOW (ref 0.76–1.27)
Globulin, Total: 2.7 g/dL (ref 1.5–4.5)
Glucose: 99 mg/dL (ref 70–99)
Potassium: 4.4 mmol/L (ref 3.5–5.2)
Sodium: 136 mmol/L (ref 134–144)
Total Protein: 6.4 g/dL (ref 6.0–8.5)
eGFR: 91 mL/min/1.73 (ref >59)

## 2024-06-27 LAB — LIPID PANEL
Chol/HDL Ratio: 2.7 ratio (ref 0.0–5.0)
Cholesterol, Total: 159 mg/dL (ref 100–199)
HDL: 59 mg/dL (ref >39)
LDL Chol Calc (NIH): 90 mg/dL (ref 0–99)
Triglycerides: 47 mg/dL (ref 0–149)
VLDL Cholesterol Cal: 10 mg/dL (ref 5–40)

## 2024-06-27 LAB — TSH: TSH: 8.64 u[IU]/mL — ABNORMAL HIGH (ref 0.450–4.500)

## 2024-09-26 ENCOUNTER — Ambulatory Visit: Admitting: Family Medicine

## 2024-09-26 ENCOUNTER — Encounter: Payer: Self-pay | Admitting: Family Medicine

## 2024-09-26 VITALS — BP 110/68 | HR 40 | Ht 68.0 in | Wt 119.6 lb

## 2024-09-26 DIAGNOSIS — E038 Other specified hypothyroidism: Secondary | ICD-10-CM

## 2024-09-26 DIAGNOSIS — C4492 Squamous cell carcinoma of skin, unspecified: Secondary | ICD-10-CM

## 2024-09-26 NOTE — Progress Notes (Signed)
   Subjective:    Patient ID: Jeffrey Hardin, male    DOB: 1934/04/30, 89 y.o.   MRN: [id number]  Discussed the use of AI scribe software for clinical note transcription with the patient, who gave verbal consent to proceed.  History of Present Illness   Jeffrey Hardin is an 89 year old male who presents with issues related to thyroid  medication tolerance.  He experiences gastrointestinal upset and gagging when taking his thyroid  medication, particularly when taken with other medications or food. To alleviate these symptoms, he has adjusted his routine to take the medication on an empty stomach, which has been effective. He waits at least 45 minutes before consuming other medications or food.  He has a history of squamous cell skin cancer that spread throughout his body, as he was told by his doctors. He underwent multiple treatments, including surgeries and chemotherapy treatment. He reports that he could see the tumors disappear on CT scans. He reports that he has not had any treatment in a long time and continues to have follow-up appointments every six months.  He feels very good and active, engaging in daily activities such as working on cars and mowing the yard. However, he notes a decline in equilibrium and reflexes, requiring caution to prevent falls. 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  He has reduced his intake of multivitamins and supplements, opting instead for protein drinks. He consumes about two protein drinks daily. No current gagging or stomach upset with adjusted thyroid  medication routine. Reports decreased equilibrium and reflexes.           Review of Systems     Objective:    Physical Exam Alert and in no distress otherwise not examined             Assessment & Plan:  Assessment and Plan    Metastatic squamous cell carcinoma of the skin Previously treated with novel therapy, significant  improvement, no recent treatment. Satisfied with quality of life, open to new treatments despite risks. - Follow-up with oncologist Dr. Deatrice next month, then every six months.  Hypothyroidism Management complicated by gastrointestinal side effects, improved with administration on an empty stomach. - Continue thyroid  medication on an empty stomach, separate from other medications by at least 45 minutes.

## 2024-09-27 LAB — TSH: TSH: 212 u[IU]/mL — ABNORMAL HIGH (ref 0.450–4.500)

## 2024-09-28 ENCOUNTER — Ambulatory Visit: Payer: Self-pay | Admitting: Family Medicine

## 2024-10-01 NOTE — Progress Notes (Signed)
Called Patient and LVM to call back

## 2024-10-02 NOTE — Progress Notes (Signed)
 Called Pt and LVM for pt to call back.

## 2024-10-04 NOTE — Progress Notes (Signed)
Called and left a voicemail for him to call back

## 2024-10-04 NOTE — Progress Notes (Signed)
Sent results in the mail. 

## 2024-11-09 ENCOUNTER — Telehealth: Payer: Self-pay

## 2024-11-09 NOTE — Telephone Encounter (Signed)
 Copied from CRM #[id number]. Topic: General - Billing Inquiry >> Nov 09, 2024  3:38 PM Delon HERO wrote: Reason for CRM: Patient's wife is calling to report that she received a bill for the patient for 119.00. Patient does not pay anything. Please advise

## 2025-01-01 ENCOUNTER — Ambulatory Visit: Payer: Medicare Other
